# Patient Record
Sex: Female | Born: 1965
Health system: Southern US, Community
[De-identification: ages and names within clinical notes are randomized; demographics above are authoritative.]

## PROBLEM LIST (undated history)

## (undated) DIAGNOSIS — R9082 White matter disease, unspecified: Secondary | ICD-10-CM

## (undated) DIAGNOSIS — Z720 Tobacco use: Secondary | ICD-10-CM

## (undated) DIAGNOSIS — I998 Other disorder of circulatory system: Secondary | ICD-10-CM

## (undated) DIAGNOSIS — R42 Dizziness and giddiness: Secondary | ICD-10-CM

## (undated) DIAGNOSIS — I10 Essential (primary) hypertension: Secondary | ICD-10-CM

## (undated) DIAGNOSIS — G629 Polyneuropathy, unspecified: Secondary | ICD-10-CM

## (undated) DIAGNOSIS — C3490 Malignant neoplasm of unspecified part of unspecified bronchus or lung: Secondary | ICD-10-CM

## (undated) DIAGNOSIS — D649 Anemia, unspecified: Secondary | ICD-10-CM

## (undated) DIAGNOSIS — K219 Gastro-esophageal reflux disease without esophagitis: Secondary | ICD-10-CM

## (undated) HISTORY — DX: White matter disease, unspecified: R90.82

## (undated) HISTORY — PX: OTHER SURGICAL HISTORY: SHX169

## (undated) HISTORY — DX: Malignant neoplasm of unspecified part of unspecified bronchus or lung: C34.90

## (undated) HISTORY — DX: Tobacco use: Z72.0

## (undated) HISTORY — DX: Gastro-esophageal reflux disease without esophagitis: K21.9

## (undated) HISTORY — DX: Polyneuropathy, unspecified: G62.9

## (undated) HISTORY — DX: Other disorder of circulatory system: I99.8

---

## 2013-12-16 ENCOUNTER — Emergency Department: Payer: Self-pay | Admitting: Emergency Medicine

## 2013-12-16 LAB — CBC WITH DIFFERENTIAL/PLATELET
Comment - H1-Com3: NORMAL
HCT: 26 % — ABNORMAL LOW (ref 35.0–47.0)
HGB: 7.6 g/dL — AB (ref 12.0–16.0)
Lymphocytes: 36 %
MCH: 18.9 pg — AB (ref 26.0–34.0)
MCHC: 29.3 g/dL — AB (ref 32.0–36.0)
MCV: 65 fL — ABNORMAL LOW (ref 80–100)
Monocytes: 5 %
Platelet: 458 10*3/uL — ABNORMAL HIGH (ref 150–440)
RBC: 4.02 10*6/uL (ref 3.80–5.20)
RDW: 25.1 % — ABNORMAL HIGH (ref 11.5–14.5)
Segmented Neutrophils: 59 %
WBC: 7.8 10*3/uL (ref 3.6–11.0)

## 2013-12-16 LAB — COMPREHENSIVE METABOLIC PANEL
ALK PHOS: 77 U/L
Albumin: 3.5 g/dL (ref 3.4–5.0)
Anion Gap: 5 — ABNORMAL LOW (ref 7–16)
BUN: 4 mg/dL — AB (ref 7–18)
Bilirubin,Total: 0.2 mg/dL (ref 0.2–1.0)
Calcium, Total: 8.7 mg/dL (ref 8.5–10.1)
Chloride: 97 mmol/L — ABNORMAL LOW (ref 98–107)
Co2: 29 mmol/L (ref 21–32)
Creatinine: 0.4 mg/dL — ABNORMAL LOW (ref 0.60–1.30)
EGFR (African American): >60
Glucose: 94 mg/dL (ref 65–99)
OSMOLALITY: 259 (ref 275–301)
POTASSIUM: 3.6 mmol/L (ref 3.5–5.1)
SGOT(AST): 21 U/L (ref 15–37)
SGPT (ALT): 18 U/L (ref 12–78)
SODIUM: 131 mmol/L — AB (ref 136–145)
TOTAL PROTEIN: 7.6 g/dL (ref 6.4–8.2)

## 2013-12-16 LAB — LIPASE, BLOOD: LIPASE: 320 U/L (ref 73–393)

## 2013-12-16 LAB — URINALYSIS, COMPLETE
BILIRUBIN, UR: NEGATIVE
BLOOD: NEGATIVE
Bacteria: NONE SEEN
Glucose,UR: NEGATIVE mg/dL (ref 0–75)
Ketone: NEGATIVE
Leukocyte Esterase: NEGATIVE
Nitrite: NEGATIVE
Ph: 7 (ref 4.5–8.0)
Protein: NEGATIVE
SPECIFIC GRAVITY: 1.015 (ref 1.003–1.030)
Squamous Epithelial: 7
WBC UR: 1 /HPF (ref 0–5)

## 2013-12-17 LAB — PREGNANCY, URINE: PREGNANCY TEST, URINE: NEGATIVE m[IU]/mL

## 2014-03-28 ENCOUNTER — Emergency Department: Payer: Self-pay | Admitting: Emergency Medicine

## 2014-03-28 LAB — BASIC METABOLIC PANEL
ANION GAP: 8 (ref 7–16)
BUN: 6 mg/dL — AB (ref 7–18)
CHLORIDE: 99 mmol/L (ref 98–107)
CO2: 25 mmol/L (ref 21–32)
CREATININE: 0.53 mg/dL — AB (ref 0.60–1.30)
Calcium, Total: 8.7 mg/dL (ref 8.5–10.1)
EGFR (African American): >60
EGFR (Non-African Amer.): >60
GLUCOSE: 95 mg/dL (ref 65–99)
OSMOLALITY: 262 (ref 275–301)
Potassium: 3.9 mmol/L (ref 3.5–5.1)
Sodium: 132 mmol/L — ABNORMAL LOW (ref 136–145)

## 2014-03-28 LAB — CBC
HCT: 25.7 % — AB (ref 35.0–47.0)
HGB: 7.8 g/dL — ABNORMAL LOW (ref 12.0–16.0)
MCH: 20.2 pg — AB (ref 26.0–34.0)
MCHC: 30.4 g/dL — ABNORMAL LOW (ref 32.0–36.0)
MCV: 66 fL — AB (ref 80–100)
Platelet: 748 10*3/uL — ABNORMAL HIGH (ref 150–440)
RBC: 3.87 10*6/uL (ref 3.80–5.20)
RDW: 29.2 % — AB (ref 11.5–14.5)
WBC: 10.4 10*3/uL (ref 3.6–11.0)

## 2014-03-28 LAB — ETHANOL: Ethanol %: <0.003 % (ref 0.000–0.080)

## 2014-03-28 LAB — MAGNESIUM: Magnesium: 2.1 mg/dL

## 2014-05-12 DIAGNOSIS — G589 Mononeuropathy, unspecified: Secondary | ICD-10-CM | POA: Insufficient documentation

## 2014-05-12 DIAGNOSIS — M21371 Foot drop, right foot: Secondary | ICD-10-CM | POA: Insufficient documentation

## 2014-05-12 DIAGNOSIS — R531 Weakness: Secondary | ICD-10-CM | POA: Insufficient documentation

## 2014-05-12 DIAGNOSIS — R200 Anesthesia of skin: Secondary | ICD-10-CM | POA: Insufficient documentation

## 2014-05-19 ENCOUNTER — Ambulatory Visit (INDEPENDENT_AMBULATORY_CARE_PROVIDER_SITE_OTHER): Payer: BC Managed Care – PPO

## 2014-05-19 ENCOUNTER — Ambulatory Visit (INDEPENDENT_AMBULATORY_CARE_PROVIDER_SITE_OTHER): Payer: BC Managed Care – PPO | Admitting: Podiatry

## 2014-05-19 ENCOUNTER — Encounter: Payer: Self-pay | Admitting: Podiatry

## 2014-05-19 VITALS — BP 127/74 | HR 75 | Resp 16 | Ht 65.0 in | Wt 138.0 lb

## 2014-05-19 DIAGNOSIS — M216X9 Other acquired deformities of unspecified foot: Secondary | ICD-10-CM

## 2014-05-19 DIAGNOSIS — G629 Polyneuropathy, unspecified: Secondary | ICD-10-CM

## 2014-05-19 DIAGNOSIS — G589 Mononeuropathy, unspecified: Secondary | ICD-10-CM

## 2014-05-19 DIAGNOSIS — M21371 Foot drop, right foot: Secondary | ICD-10-CM

## 2014-05-19 NOTE — Progress Notes (Signed)
   Subjective:    Patient ID: Jenna Newton, female    DOB: 19-Sep-1965, 48 y.o.   MRN: [id number]  HPI Comments: Ms. Tech, 48 year old female, presents the office today for numbness in her right foot and to be stated for orthotic. She states that she previously had seen a podiatrist at [facility] clinic and further evaluated by a neurologist, Dr. Gurney Maxin, MD. She previously was placed on steroids and given a surgical boot, both of which did not alleviate her symptoms. She states that she's had numbness in her right foot and leg over the last 2 months and has remained the same. She denies any injury. States that she has difficulty moving her ankle and difficulty with ambulation. She is unable to move her ankle up. She does not have a primary care physician. No other complaints at this time.   Foot Pain      Review of Systems  Cardiovascular:       Calf pain  Musculoskeletal:       Back pain  All other systems reviewed and are negative.      Objective:   Physical Exam AAO x3, NAD DP/PT pulses palpable 2/4 b/l. CRT < 3sec Protective sensation decreased the dorsal of the right foot and along the anterior leg. Vibratory sensation decreased in the right. Decreased ankle dorsiflexion in both active and passive range of motion. Anterior muscle group 3/5, plantarflexion 5/5. During gait, unable to dorsiflex ankle.  No pinpoint bony tenderness.  No open lesions No leg pain/swelling/edema.       Assessment & Plan:  48 year old female with a right drop foot -Dr. Lannie Fields notes reviewed.  - Discussed various treatment options the patient including alternatives, risks, complications. -Recommended an ankle foot orthotic to help accommodate her dropfoot. She was casted for this today. Will be sent to [facility]. -She will be undergoing nerve conduction testing next week. I discussed with her to have the results faxed to [place] or to bring them at her next appointment.  -Follow up after the  AFO arrives. In the meantime, call with any questions/concerns/change in symptoms.

## 2014-06-17 ENCOUNTER — Telehealth: Payer: Self-pay | Admitting: *Deleted

## 2014-06-17 NOTE — Telephone Encounter (Signed)
Left message stating that she had a question concerning a brace her doctor put her in

## 2014-06-21 ENCOUNTER — Telehealth: Payer: Self-pay | Admitting: Podiatry

## 2014-06-21 NOTE — Telephone Encounter (Signed)
[place] brace is in, left message for [place] to call and schedule an appointment with Dr. Jacqualyn Posey

## 2014-06-23 ENCOUNTER — Ambulatory Visit (INDEPENDENT_AMBULATORY_CARE_PROVIDER_SITE_OTHER): Payer: BC Managed Care – PPO | Admitting: *Deleted

## 2014-06-23 ENCOUNTER — Encounter: Payer: Self-pay | Admitting: *Deleted

## 2014-06-23 VITALS — BP 146/80 | HR 87 | Resp 16

## 2014-06-23 DIAGNOSIS — M21371 Foot drop, right foot: Secondary | ICD-10-CM

## 2014-06-23 NOTE — Progress Notes (Signed)
Pt presents to pick up [place].

## 2014-07-26 ENCOUNTER — Ambulatory Visit (INDEPENDENT_AMBULATORY_CARE_PROVIDER_SITE_OTHER): Payer: BC Managed Care – PPO | Admitting: Podiatry

## 2014-07-26 DIAGNOSIS — M21371 Foot drop, right foot: Secondary | ICD-10-CM

## 2014-07-26 NOTE — Progress Notes (Signed)
Patient ID: Jenna Newton, female   DOB: 07-13-1966, 48 y.o.   MRN: [id number]  Subjective: 48 year old female returns the office today for follow-up evaluation after dispensing AFO for right foot secondary to dropfoot. She states that since wearing the brace she's had some discomfort of the left leg as she uses that leg more. She also states that her right leg has become significant smaller than her left leg. She has pain is a been seen by the neurologist and she is starting physical therapy on December 1. She states that since she was told she is not improving she has an appointment to see who she believes is a Psychologist, sport and exercise for evaluation in January. No acute changes since last appointment. No other complaints at this time. Denies any systemic complaints such as fevers, chills, nausea, vomiting.  Objective: AAO x3, NAD DP/PT pulses palpable bilaterally, CRT less than 3 seconds Protective sensation intact with Simms Weinstein monofilament, vibratory sensation intact, Achilles tendon reflex intact The brace appears to be fitting well without any high-pressure areas. The right lower extremity is atrophied compared to the left lower extremity. There is weakness in plantarflexion and dorsiflexion of the ankle. She is starting to develop a mild adductus to her foot while nonweightbearing. There is no pinpoint bony tenderness or pain with vibratory sensation. No overlying edema, erythema, increase in warmth.  Assessment: 48 year old female with right foot drop, etiology unclear at this time for likely due to compression.  Plan: -Treatment options discussed including alternatives, risks, complications. -Patient previously has been seen by neurology and recommended continue follow-up with them. She'll be starting physical therapy December 1st. I discussed with her that physical therapy will help with the strengthening exercises to the right lower extremity and help regain some use of the right leg and has become  weaker over the last several months. She is putting most of her weight on her left foot. Patient also states that she has an appointment for his she believes of the surgeon in January for further evaluation of possible surgical intervention.  -Discussed that she can overtime developed deformity to her foot due to the nerve issue. Discussed with her that the AFO and help support her foot and slow the progression. -Recommended patient follow-up in approximate 4 weeks after starting physical therapy and continues to use the AFO. In the meantime, call the office with any questions, concerns, change in symptoms.

## 2014-07-26 NOTE — Patient Instructions (Signed)
Follow up with physical therapy and your neurologist.  Call with any questions/concerns/change in symptoms.

## 2014-08-02 ENCOUNTER — Encounter: Payer: Self-pay | Admitting: Neurology

## 2014-08-23 ENCOUNTER — Ambulatory Visit (INDEPENDENT_AMBULATORY_CARE_PROVIDER_SITE_OTHER): Payer: BC Managed Care – PPO | Admitting: Podiatry

## 2014-08-23 VITALS — BP 121/71 | HR 91 | Resp 16

## 2014-08-23 DIAGNOSIS — M21371 Foot drop, right foot: Secondary | ICD-10-CM

## 2014-08-24 NOTE — Progress Notes (Signed)
Patient ID: Jenna Newton, female   DOB: 02/27/1966, 48 y.o.   MRN: [id number]  Subjective: 48 year old female returns the office today for follow-up evaluation of AFO and dropfoot on the right foot. The patient states that since last appointment she has gone to physical therapy and she has been discharged. She states that the strength that her right leg is improved compared to what it was prior. She states that she has not been wearing the AFO at work because she feels unbalanced while wearing it. She did not wear it at today's appointment. No other complaints at this time in no acute changes since last appointment. Denies any systemic complaints as fevers, chills, nausea, vomiting.  Objective: AAO 3, NAD DP/PT pulses palpable bilaterally, CRT less than 3 seconds  Protective sensation intact with Simms Weinstein monofilament, vibratory sensation intact, Achilles tendon reflex intact On the right foot there does appear to be in improved dorsiflexion and plantarflexion at the ankle compared to initial evaluation and her strength is improved as well. There is less significant atrophy to the right lower extremity compared to prior from disuse. No open lesions or pre-ulcerative lesions. No pain with calf compression, swelling, warmth, erythema.  Assessment: 48 year old female follow-up of right foot drop foot/AFO  Plan: -Treatment options discussed with the patient including alternatives, risks, complications. -Although the patient has been discharged from physical therapy she states that she feels unbalanced with the brace and is unable to wear. At this time recommended the patient to be further evaluated by physical therapy with the brace in order to regain balance and gait training. Although, her symptoms have improved of the dropfoot she has not fully regained the strength and range of motion. She states that she has an appointment with neurosurgery in January for further evaluation. Recommended to  continue to keep his appointment follow-up with neurology as well. Follow-up after physical therapy evaluation or sooner if any problems are to arise. In the meantime, call the office with any questions, concerns, change in symptoms.

## 2014-09-08 ENCOUNTER — Encounter: Payer: Self-pay | Admitting: *Deleted

## 2014-09-08 ENCOUNTER — Other Ambulatory Visit: Payer: Self-pay | Admitting: *Deleted

## 2014-09-09 ENCOUNTER — Ambulatory Visit: Payer: Self-pay | Admitting: Neurology

## 2014-09-12 ENCOUNTER — Telehealth: Payer: Self-pay | Admitting: Neurology

## 2014-09-12 NOTE — Telephone Encounter (Signed)
Pt no showed 09/09/14 NP appt w/ Dr. Posey Pronto.   [name] - please mail pt no show letter + our no show policy. Please make Dr. Lannie Fields office aware as well / Sherri S.

## 2014-09-15 ENCOUNTER — Encounter: Payer: Self-pay | Admitting: *Deleted

## 2014-09-15 NOTE — Progress Notes (Signed)
No show letters sent for 09/09/2014 Dr. Weber Cooks office notified Jenna Newton) no show notification sent as well

## 2014-11-07 ENCOUNTER — Ambulatory Visit: Payer: Self-pay | Admitting: Family Medicine

## 2014-11-11 ENCOUNTER — Ambulatory Visit: Payer: Self-pay | Admitting: Neurology

## 2014-11-14 ENCOUNTER — Telehealth: Payer: Self-pay | Admitting: Neurology

## 2014-11-14 NOTE — Telephone Encounter (Signed)
Pt has no showed 2nd NP appt w/ Dr. Posey Pronto. Per Dr. Posey Pronto, we will be unable to r/s a future appt with our office.   [name] - please contact Dr. Lannie Fields office at [phone] to make them aware of this 2nd no show / Sherri S.

## 2014-11-15 NOTE — Telephone Encounter (Signed)
Dr. Weber Cooks office Nevin Bloodgood) notified

## 2015-01-23 ENCOUNTER — Emergency Department
Admission: EM | Admit: 2015-01-23 | Discharge: 2015-01-23 | Discharge status: Against Medical Advice | Payer: BLUE CROSS/BLUE SHIELD | Attending: Emergency Medicine | Admitting: Emergency Medicine

## 2015-01-23 ENCOUNTER — Encounter: Payer: Self-pay | Admitting: Emergency Medicine

## 2015-01-23 DIAGNOSIS — N939 Abnormal uterine and vaginal bleeding, unspecified: Secondary | ICD-10-CM | POA: Diagnosis present

## 2015-01-23 DIAGNOSIS — Z720 Tobacco use: Secondary | ICD-10-CM | POA: Diagnosis not present

## 2015-01-23 LAB — CBC
HCT: 39.1 % (ref 35.0–47.0)
Hemoglobin: 12.3 g/dL (ref 12.0–16.0)
MCH: 28.3 pg (ref 26.0–34.0)
MCHC: 31.6 g/dL — AB (ref 32.0–36.0)
MCV: 89.6 fL (ref 80.0–100.0)
Platelets: 338 10*3/uL (ref 150–440)
RBC: 4.36 MIL/uL (ref 3.80–5.20)
RDW: 18.5 % — ABNORMAL HIGH (ref 11.5–14.5)
WBC: 4.9 10*3/uL (ref 3.6–11.0)

## 2015-01-23 NOTE — ED Notes (Signed)
Called for patient x1

## 2015-01-23 NOTE — ED Notes (Signed)
Called for patient again. No response at this time.

## 2015-01-23 NOTE — ED Notes (Signed)
Says bled for 40 days feb/march.  Saw pcp,  And was told to go to specialist, but has not gone because sheis spotting all the time.  Now says heavy bleeding started again

## 2015-01-23 NOTE — ED Notes (Signed)
Called for patient in lobby. No answer

## 2015-02-14 ENCOUNTER — Encounter: Payer: Self-pay | Admitting: Obstetrics and Gynecology

## 2015-08-26 ENCOUNTER — Emergency Department: Payer: BLUE CROSS/BLUE SHIELD

## 2015-08-26 ENCOUNTER — Emergency Department
Admission: EM | Admit: 2015-08-26 | Discharge: 2015-08-26 | Disposition: A | Discharge status: Home / Self Care | Payer: BLUE CROSS/BLUE SHIELD | Attending: Emergency Medicine | Admitting: Emergency Medicine

## 2015-08-26 DIAGNOSIS — S0990XA Unspecified injury of head, initial encounter: Secondary | ICD-10-CM | POA: Diagnosis not present

## 2015-08-26 DIAGNOSIS — Y998 Other external cause status: Secondary | ICD-10-CM | POA: Insufficient documentation

## 2015-08-26 DIAGNOSIS — Y9289 Other specified places as the place of occurrence of the external cause: Secondary | ICD-10-CM | POA: Diagnosis not present

## 2015-08-26 DIAGNOSIS — W01198A Fall on same level from slipping, tripping and stumbling with subsequent striking against other object, initial encounter: Secondary | ICD-10-CM | POA: Insufficient documentation

## 2015-08-26 DIAGNOSIS — F17200 Nicotine dependence, unspecified, uncomplicated: Secondary | ICD-10-CM | POA: Diagnosis not present

## 2015-08-26 DIAGNOSIS — F10120 Alcohol abuse with intoxication, uncomplicated: Secondary | ICD-10-CM | POA: Insufficient documentation

## 2015-08-26 DIAGNOSIS — Z79899 Other long term (current) drug therapy: Secondary | ICD-10-CM | POA: Insufficient documentation

## 2015-08-26 DIAGNOSIS — S0101XA Laceration without foreign body of scalp, initial encounter: Secondary | ICD-10-CM

## 2015-08-26 DIAGNOSIS — S0003XA Contusion of scalp, initial encounter: Secondary | ICD-10-CM

## 2015-08-26 DIAGNOSIS — Y9389 Activity, other specified: Secondary | ICD-10-CM | POA: Insufficient documentation

## 2015-08-26 HISTORY — DX: Dizziness and giddiness: R42

## 2015-08-26 HISTORY — DX: Anemia, unspecified: D64.9

## 2015-08-26 LAB — BASIC METABOLIC PANEL
Anion gap: 9 (ref 5–15)
BUN: 6 mg/dL (ref 6–20)
CHLORIDE: 96 mmol/L — AB (ref 101–111)
CO2: 25 mmol/L (ref 22–32)
Calcium: 8.8 mg/dL — ABNORMAL LOW (ref 8.9–10.3)
Creatinine, Ser: 0.32 mg/dL — ABNORMAL LOW (ref 0.44–1.00)
GFR calc Af Amer: >60 mL/min (ref >60)
GFR calc non Af Amer: >60 mL/min (ref >60)
GLUCOSE: 90 mg/dL (ref 65–99)
POTASSIUM: 4 mmol/L (ref 3.5–5.1)
Sodium: 130 mmol/L — ABNORMAL LOW (ref 135–145)

## 2015-08-26 LAB — CBC WITH DIFFERENTIAL/PLATELET
Basophils Absolute: 0.1 10*3/uL (ref 0–0.1)
Basophils Relative: 1 %
EOS PCT: 0 %
Eosinophils Absolute: 0 10*3/uL (ref 0–0.7)
HEMATOCRIT: 44.3 % (ref 35.0–47.0)
Hemoglobin: 14.9 g/dL (ref 12.0–16.0)
LYMPHS ABS: 3.8 10*3/uL — AB (ref 1.0–3.6)
LYMPHS PCT: 44 %
MCH: 32.5 pg (ref 26.0–34.0)
MCHC: 33.6 g/dL (ref 32.0–36.0)
MCV: 96.7 fL (ref 80.0–100.0)
MONO ABS: 0.3 10*3/uL (ref 0.2–0.9)
MONOS PCT: 3 %
Neutro Abs: 4.4 10*3/uL (ref 1.4–6.5)
Neutrophils Relative %: 52 %
PLATELETS: 292 10*3/uL (ref 150–440)
RBC: 4.58 MIL/uL (ref 3.80–5.20)
RDW: 14.3 % (ref 11.5–14.5)
WBC: 8.6 10*3/uL (ref 3.6–11.0)

## 2015-08-26 LAB — ETHANOL: Alcohol, Ethyl (B): 424 mg/dL (ref <5)

## 2015-08-26 NOTE — ED Provider Notes (Signed)
[facility] Emergency Department Provider Note   ____________________________________________  Time seen: I have reviewed the triage vital signs and the triage nursing note.  HISTORY  Chief Complaint Head Laceration   Historian Limited historian -Patient intoxicated Here with brother, family  HPI Lakeesha Fontanilla is a 49 y.o. female with history of alcohol abuse, who is clinically intoxicated and was off balance and fell striking the back of her head, came in with a bleeding scalp laceration. Denies headache, neck pain, back pain.  No chest pain or trouble breathing. No exacerbating or alleviating factors.    Past Medical History  Diagnosis Date  . GERD (gastroesophageal reflux disease)   . Vertigo   . Anemia     There are no active problems to display for this patient.   Past Surgical History  Procedure Laterality Date  . None      Current Outpatient Rx  Name  Route  Sig  Dispense  Refill  . ferrous sulfate 325 (65 FE) MG tablet   Oral   Take by mouth.         . meclizine (ANTIVERT) 25 MG tablet   Oral   Take 25 mg by mouth 4 (four) times daily as needed for dizziness.         . methylPREDNIsolone (MEDROL DOSPACK) 4 MG tablet                 Allergies Review of patient's allergies indicates no known allergies.  Family History  Problem Relation Age of Onset  . Diabetes Mother   . Hypertension Mother   . Cancer Father   . Diabetes Maternal Grandmother   . Hypertension Maternal Grandmother   . Cancer Paternal Grandmother   . Cancer Paternal Grandfather     Social History Social History  Substance Use Topics  . Smoking status: Current Every Day Smoker  . Smokeless tobacco: None  . Alcohol Use: 0.0 oz/week    0 Standard drinks or equivalent per week    Review of Systems  Constitutional: Negative for fever. Eyes: Negative for visual changes. ENT: Negative for sore throat. Cardiovascular: Negative for chest  pain. Respiratory: Negative for shortness of breath. Gastrointestinal: Negative for abdominal pain, vomiting and diarrhea. Genitourinary: Negative for dysuria. Musculoskeletal: Negative for back pain. Skin: Negative for rash. Neurological: Negative for headache. 10 point Review of Systems otherwise negative ____________________________________________   PHYSICAL EXAM:  VITAL SIGNS: ED Triage Vitals  Enc Vitals Group     BP 08/26/15 1514 181/124 mmHg     Pulse Rate 08/26/15 1514 102     Resp 08/26/15 1514 18     Temp 08/26/15 1514 98.8 F (37.1 C)     Temp Source 08/26/15 1514 Oral     SpO2 08/26/15 1514 98 %     Weight 08/26/15 1514 130 lb (58.968 kg)     Height 08/26/15 1514 '5\' 5"'$  (1.651 m)     Head Cir --      Peak Flow --      Pain Score 08/26/15 1515 0     Pain Loc --      Pain Edu? --      Excl. in [place]? --      Constitutional: Alert and clinically intoxicated.  Eyes: Conjunctivae are normal. PERRL. Normal extraocular movements. ENT   Head: Large posterior scalp hematoma with 5 cm laceration with active bleeding.   Nose: No congestion/rhinnorhea.   Mouth/Throat: Mucous membranes are moist.   Neck: No stridor.  No step-offs. Cardiovascular/Chest: Normal rate, regular rhythm.  No murmurs, rubs, or gallops. Respiratory: Normal respiratory effort without tachypnea nor retractions. Breath sounds are clear and equal bilaterally. No wheezes/rales/rhonchi. Gastrointestinal: Soft. No distention, no guarding, no rebound. Nontender   Genitourinary/rectal:Deferred Musculoskeletal: Nontender with normal range of motion in all extremities. No joint effusions.  No lower extremity tenderness.  No edema. Neurologic:  Boisterous, but alert and redirectable/cooperative.  No focal neurologic deficits. Skin:  Skin is warm, dry.  Scalp lac as above Psychiatric: intoxicated but cooperative.  ____________________________________________   EKG I, Lisa Roca, MD, the  attending physician have personally viewed and interpreted all ECGs.  None ____________________________________________  LABS (pertinent positives/negatives)  Basic metabolic panel significant for sodium 1:30, chloride 96 CBC within normal limits Ethanol  424  ____________________________________________  RADIOLOGY All Xrays were viewed by me. Imaging interpreted by Radiologist.  CT head noncontrast and CT cervical spine non-contrast:   IMPRESSION: 1. No acute intracranial abnormality. 2. No evidence for cervical spine fracture. 3. Cervical degenerative disc disease noted. __________________________________________  PROCEDURES  Procedure(s) performed: LACERATION REPAIR Performed by: Lisa Roca Authorized by: Lisa Roca Consent: Verbal consent obtained. Risks and benefits: risks, benefits and alternatives were discussed Consent given by: patient Patient identity confirmed: provided demographic data Prepped and Draped in normal sterile fashion Wound explored  Laceration Location: Posterior scalp  Laceration Length: 5 cm  No Foreign Bodies seen or palpated  Irrigation method: syringe Amount of cleaning: standard  Skin closure: 4-0 Prolene   Number of sutures: 5   Technique: Interrupted   Patient tolerance: Patient tolerated the procedure well with no immediate complications.   Critical Care performed: None  ____________________________________________   ED COURSE / ASSESSMENT AND PLAN  CONSULTATIONS: None  Pertinent labs & imaging results that were available during my care of the patient were reviewed by me and considered in my medical decision making (see chart for details).  Patient with bleeding scalp laceration closed with sutures with hemostasis obtained.   No other apparent physical injuries from the fall, but due to intoxication, I did obtain CT of the head and C-spine. These were negative for acute traumatic injury.  Patient's fianc  is here with patient, and comfortable taking her home.    Patient / Family / Caregiver informed of clinical course, medical decision-making process, and agree with plan.   I discussed return precautions, follow-up instructions, and discharged instructions with patient and/or family.  ___________________________________________   FINAL CLINICAL IMPRESSION(S) / ED DIAGNOSES   Final diagnoses:  Scalp laceration, initial encounter  Scalp hematoma, initial encounter   alcohol intoxication     Lisa Roca, MD 08/26/15 [phone]

## 2015-08-26 NOTE — Discharge Instructions (Signed)
You were treated for head injury and your scalp laceration was repaired with 5 stitches.  These need to be removed in about 14 days -- make an appointment with your primary doctor for stitches removal.  Return to the emergency department for any worsening condition including any new pain, weakness, numbness, confusion or altered mental status.   Facial or Scalp Contusion A facial or scalp contusion is a deep bruise on the face or head. Injuries to the face and head generally cause a lot of swelling, especially around the eyes. Contusions are the result of an injury that caused bleeding under the skin. The contusion may turn blue, purple, or yellow. Minor injuries will give you a painless contusion, but more severe contusions may stay painful and swollen for a few weeks.  CAUSES  A facial or scalp contusion is caused by a blunt injury or trauma to the face or head area.  SIGNS AND SYMPTOMS   Swelling of the injured area.   Discoloration of the injured area.   Tenderness, soreness, or pain in the injured area.  DIAGNOSIS  The diagnosis can be made by taking a medical history and doing a physical exam. An X-ray exam, CT scan, or MRI may be needed to determine if there are any associated injuries, such as broken bones (fractures). TREATMENT  Often, the best treatment for a facial or scalp contusion is applying cold compresses to the injured area. Over-the-counter medicines may also be recommended for pain control.  HOME CARE INSTRUCTIONS   Only take over-the-counter or prescription medicines as directed by your health care provider.   Apply ice to the injured area.   Put ice in a plastic bag.   Place a towel between your skin and the bag.   Leave the ice on for 20 minutes, 2-3 times a day.  SEEK MEDICAL CARE IF:  You have bite problems.   You have pain with chewing.   You are concerned about facial defects. SEEK IMMEDIATE MEDICAL CARE IF:  You have severe pain or a  headache that is not relieved by medicine.   You have unusual sleepiness, confusion, or personality changes.   You throw up (vomit).   You have a persistent nosebleed.   You have double vision or blurred vision.   You have fluid drainage from your nose or ear.   You have difficulty walking or using your arms or legs.  MAKE SURE YOU:   Understand these instructions.  Will watch your condition.  Will get help right away if you are not doing well or get worse.   This information is not intended to replace advice given to you by your health care provider. Make sure you discuss any questions you have with your health care provider.   Document Released: 09/26/2004 Document Revised: 09/09/2014 Document Reviewed: 04/01/2013 Elsevier Interactive Patient Education 2016 [place], Adult A laceration is a cut that goes through all layers of the skin. The cut also goes into the tissue that is right under the skin. Some cuts heal on their own. Others need to be closed with stitches (sutures), staples, skin adhesive strips, or wound glue. Taking care of your cut lowers your risk of infection and helps your cut to heal better. HOW TO TAKE CARE OF YOUR CUT For stitches or staples:  Keep the wound clean and dry.  If you were given a bandage (dressing), you should change it at least one time per day or as told by  your doctor. You should also change it if it gets wet or dirty.  Keep the wound completely dry for the first 24 hours or as told by your doctor. After that time, you may take a shower or a bath. However, make sure that the wound is not soaked in water until after the stitches or staples have been removed.  Clean the wound one time each day or as told by your doctor:  Wash the wound with soap and water.  Rinse the wound with water until all of the soap comes off.  Pat the wound dry with a clean towel. Do not rub the wound.  After you clean the wound, put  a thin layer of antibiotic ointment on it as told by your doctor. This ointment:  Helps to prevent infection.  Keeps the bandage from sticking to the wound.  Have your stitches or staples removed as told by your doctor. If your doctor used skin adhesive strips:   Keep the wound clean and dry.  If you were given a bandage, you should change it at least one time per day or as told by your doctor. You should also change it if it gets dirty or wet.  Do not get the skin adhesive strips wet. You can take a shower or a bath, but be careful to keep the wound dry.  If the wound gets wet, pat it dry with a clean towel. Do not rub the wound.  Skin adhesive strips fall off on their own. You can trim the strips as the wound heals. Do not remove any strips that are still stuck to the wound. They will fall off after a while. If your doctor used wound glue:  Try to keep your wound dry, but you may briefly wet it in the shower or bath. Do not soak the wound in water, such as by swimming.  After you take a shower or a bath, gently pat the wound dry with a clean towel. Do not rub the wound.  Do not do any activities that will make you really sweaty until the skin glue has fallen off on its own.  Do not apply liquid, cream, or ointment medicine to your wound while the skin glue is still on.  If you were given a bandage, you should change it at least one time per day or as told by your doctor. You should also change it if it gets dirty or wet.  If a bandage is placed over the wound, do not let the tape for the bandage touch the skin glue.  Do not pick at the glue. The skin glue usually stays on for 5-10 days. Then, it falls off of the skin. General Instructions  To help prevent scarring, make sure to cover your wound with sunscreen whenever you are outside after stitches are removed, after adhesive strips are removed, or when wound glue stays in place and the wound is healed. Make sure to wear a  sunscreen of at least 30 SPF.  Take over-the-counter and prescription medicines only as told by your doctor.  If you were given antibiotic medicine or ointment, take or apply it as told by your doctor. Do not stop using the antibiotic even if your wound is getting better.  Do not scratch or pick at the wound.  Keep all follow-up visits as told by your doctor. This is important.  Check your wound every day for signs of infection. Watch for:  Redness, swelling, or pain.  Fluid, blood, or pus.  Raise (elevate) the injured area above the level of your heart while you are sitting or lying down, if possible. GET HELP IF:  You got a tetanus shot and you have any of these problems at the injection site:  Swelling.  Very bad pain.  Redness.  Bleeding.  You have a fever.  A wound that was closed breaks open.  You notice a bad smell coming from your wound or your bandage.  You notice something coming out of the wound, such as wood or glass.  Medicine does not help your pain.  You have more redness, swelling, or pain at the site of your wound.  You have fluid, blood, or pus coming from your wound.  You notice a change in the color of your skin near your wound.  You need to change the bandage often because fluid, blood, or pus is coming from the wound.  You start to have a new rash.  You start to have numbness around the wound. GET HELP RIGHT AWAY IF:  You have very bad swelling around the wound.  Your pain suddenly gets worse and is very bad.  You notice painful lumps near the wound or on skin that is anywhere on your body.  You have a red streak going away from your wound.  The wound is on your hand or foot and you cannot move a finger or toe like you usually can.  The wound is on your hand or foot and you notice that your fingers or toes look pale or bluish.   This information is not intended to replace advice given to you by your health care provider. Make sure  you discuss any questions you have with your health care provider.   Document Released: 02/05/2008 Document Revised: 01/03/2015 Document Reviewed: 08/15/2014 Elsevier Interactive Patient Education [employer].

## 2015-08-26 NOTE — ED Notes (Signed)
MD Lord at bedside, Pt received 5 stitches. Pt tolerated well

## 2015-08-26 NOTE — ED Notes (Addendum)
Pt attempting to climb out of bed, pt staggering and unsteady. Advised of the need to stay in bed and her risk of falling. Sitter at bedside and bed alarm on

## 2015-08-26 NOTE — ED Notes (Signed)
Patient transported to CT 

## 2015-08-26 NOTE — ED Notes (Signed)
Pt arrived via EMS from home, reports a witnessed fall, hit back of head, actively bleeding on assessment. Intoxicated on assessment, admits to ETOH use this morning. Alert and oriented x4

## 2017-03-13 ENCOUNTER — Encounter: Payer: Self-pay | Admitting: Family Medicine

## 2017-03-14 ENCOUNTER — Encounter: Payer: Self-pay | Admitting: Family Medicine

## 2017-03-14 ENCOUNTER — Ambulatory Visit (INDEPENDENT_AMBULATORY_CARE_PROVIDER_SITE_OTHER): Payer: BLUE CROSS/BLUE SHIELD | Admitting: Family Medicine

## 2017-03-14 DIAGNOSIS — G629 Polyneuropathy, unspecified: Secondary | ICD-10-CM | POA: Diagnosis not present

## 2017-03-14 DIAGNOSIS — R030 Elevated blood-pressure reading, without diagnosis of hypertension: Secondary | ICD-10-CM | POA: Diagnosis not present

## 2017-03-14 DIAGNOSIS — R631 Polydipsia: Secondary | ICD-10-CM

## 2017-03-14 DIAGNOSIS — D649 Anemia, unspecified: Secondary | ICD-10-CM

## 2017-03-14 DIAGNOSIS — R682 Dry mouth, unspecified: Secondary | ICD-10-CM

## 2017-03-14 DIAGNOSIS — Z720 Tobacco use: Secondary | ICD-10-CM

## 2017-03-14 DIAGNOSIS — R42 Dizziness and giddiness: Secondary | ICD-10-CM | POA: Diagnosis not present

## 2017-03-14 HISTORY — DX: Tobacco use: Z72.0

## 2017-03-14 LAB — LIPID PANEL
CHOL/HDL RATIO: 5.6 ratio — AB (ref <5.0)
CHOLESTEROL: 272 mg/dL — AB (ref <200)
HDL: 49 mg/dL — ABNORMAL LOW (ref >50)
LDL Cholesterol: 183 mg/dL — ABNORMAL HIGH (ref <100)
Triglycerides: 202 mg/dL — ABNORMAL HIGH (ref <150)
VLDL: 40 mg/dL — ABNORMAL HIGH (ref <30)

## 2017-03-14 LAB — COMPLETE METABOLIC PANEL WITH GFR
ALT: 24 U/L (ref 6–29)
AST: 19 U/L (ref 10–35)
Albumin: 4.3 g/dL (ref 3.6–5.1)
Alkaline Phosphatase: 68 U/L (ref 33–130)
BILIRUBIN TOTAL: 0.3 mg/dL (ref 0.2–1.2)
BUN: 11 mg/dL (ref 7–25)
CALCIUM: 9.6 mg/dL (ref 8.6–10.4)
CO2: 25 mmol/L (ref 20–31)
CREATININE: 0.5 mg/dL (ref 0.50–1.05)
Chloride: 103 mmol/L (ref 98–110)
Glucose, Bld: 97 mg/dL (ref 65–99)
Potassium: 3.9 mmol/L (ref 3.5–5.3)
Sodium: 139 mmol/L (ref 135–146)
TOTAL PROTEIN: 7.2 g/dL (ref 6.1–8.1)

## 2017-03-14 LAB — TSH: TSH: 1.16 m[IU]/L

## 2017-03-14 MED ORDER — FERROUS SULFATE 325 (65 FE) MG PO TABS: 325.0000 mg | ORAL_TABLET | Freq: Every day | ORAL | 0 refills | Status: DC

## 2017-03-14 MED ORDER — MECLIZINE HCL 25 MG PO TABS: 25.0000 mg | ORAL_TABLET | Freq: Four times a day (QID) | ORAL | 2 refills | Status: DC | PRN

## 2017-03-14 MED ORDER — LISINOPRIL 10 MG PO TABS: 10.0000 mg | ORAL_TABLET | Freq: Every day | ORAL | 0 refills | Status: DC

## 2017-03-14 NOTE — Assessment & Plan Note (Signed)
Taking iron; refuses colonoscopy; stool cards given; check CBC, other labs

## 2017-03-14 NOTE — Assessment & Plan Note (Signed)
Check glucose and A1c to r/o diabetes; strong fam hx

## 2017-03-14 NOTE — Assessment & Plan Note (Signed)
Patient is not interested in quitting; I am here to help her when desired

## 2017-03-14 NOTE — Assessment & Plan Note (Signed)
Check labs today to r/o diabetes

## 2017-03-14 NOTE — Assessment & Plan Note (Signed)
Check labs 

## 2017-03-14 NOTE — Assessment & Plan Note (Signed)
Try DASH guidelines 

## 2017-03-14 NOTE — Progress Notes (Signed)
BP (!) 150/82   Pulse 84   Temp 98.1 F (36.7 C) (Oral)   Resp 14   Wt 151 lb 12.8 oz (68.9 kg)   LMP 01/23/2015   SpO2 97%   BMI 25.26 kg/m    Subjective:    Patient ID: Jenna Newton, female    DOB: 1965-11-13, 51 y.o.   MRN: [id number]  HPI: Jenna Newton is a 51 y.o. female  Chief Complaint  Patient presents with  . Labs Only    wants to bet tested for DM symptoms include: dizziness, dry mouth, increased thirst and urination.    HPI She is new to me She wants to be tested for diabetes; dry mouth, increased thirst, increased urination, dizziness Small headaches; very dry skin Brother has diabetes, lots of family members Having dizziness, has had it for years; takes meclizine but this week it's worse Feeling awful all week; has been taking meclizine since 2016; vertigo runs in the family, aunt and cousin Anemia; taking iron; seeing blood in the stool at times; just when constipated; she has not had a colonoscopy and does not want one Blood pressure up today; lots of HTN in the family Neuropathy; she was told it could be diabetes; her right foot went numb for 2-3 weeks, went to the hospital; has to make the right foot move; they thought she had a stroke but she didn't Two head CTs, one in 2016 and one in 2015; no stroke; she does not think she needs another head CT today Smokes; 8 cigs a day; not ready to quit  Depression screen [facility] 2/9 03/14/2017  Decreased Interest 0  Down, Depressed, Hopeless 0  PHQ - 2 Score 0   Relevant past medical, surgical, family and social history reviewed Past Medical History:  Diagnosis Date  . Anemia   . GERD (gastroesophageal reflux disease)   . Neuropathy   . Tobacco abuse 03/14/2017  . Vertigo    Past Surgical History:  Procedure Laterality Date  . none     Family History  Problem Relation Age of Onset  . Diabetes Mother   . Hypertension Mother   . Cancer Father   . Diabetes Maternal Grandmother   . Hypertension Maternal  Grandmother   . Cancer Paternal Grandmother   . Cancer Paternal Grandfather    Social History   Social History  . Marital status: Single    Spouse name: N/A  . Number of children: N/A  . Years of education: N/A   Occupational History  . Not on file.   Social History Main Topics  . Smoking status: Current Every Day Smoker  . Smokeless tobacco: Never Used  . Alcohol use 14.4 oz/week    24 Cans of beer per week  . Drug use: No  . Sexual activity: Yes   Other Topics Concern  . Not on file   Social History Narrative  . No narrative on file    Interim medical history since last visit reviewed. Allergies and medications reviewed  Review of Systems Per HPI unless specifically indicated above     Objective:    BP (!) 150/82   Pulse 84   Temp 98.1 F (36.7 C) (Oral)   Resp 14   Wt 151 lb 12.8 oz (68.9 kg)   LMP 01/23/2015   SpO2 97%   BMI 25.26 kg/m   Wt Readings from Last 3 Encounters:  03/14/17 151 lb 12.8 oz (68.9 kg)  08/26/15 130 lb (59 kg)  01/23/15  128 lb (58.1 kg)    Physical Exam  Constitutional: She appears well-developed and well-nourished. No distress.  Smell of cigarette smoke quite obvious  HENT:  Head: Normocephalic and atraumatic.  Eyes: EOM are normal. No scleral icterus.  Neck: No thyromegaly present.  Cardiovascular: Normal rate, regular rhythm and normal heart sounds.   No murmur heard. Pulmonary/Chest: Effort normal and breath sounds normal. No respiratory distress. She has no wheezes.  Abdominal: Soft. Bowel sounds are normal. She exhibits no distension.  Musculoskeletal: Normal range of motion. She exhibits no edema.  Neurological: She is alert. She exhibits normal muscle tone.  Skin: Skin is warm and dry. She is not diaphoretic. No pallor.  Psychiatric: She has a normal mood and affect. Her behavior is normal. Judgment and thought content normal.   Results for orders placed or performed during the hospital encounter of 70/62/37    Basic metabolic panel  Result Value Ref Range   Sodium 130 (L) 135 - 145 mmol/L   Potassium 4.0 3.5 - 5.1 mmol/L   Chloride 96 (L) 101 - 111 mmol/L   CO2 25 22 - 32 mmol/L   Glucose, Bld 90 65 - 99 mg/dL   BUN 6 6 - 20 mg/dL   Creatinine, Ser 0.32 (L) 0.44 - 1.00 mg/dL   Calcium 8.8 (L) 8.9 - 10.3 mg/dL   GFR calc non Af Amer >60 >60 mL/min   GFR calc Af Amer >60 >60 mL/min   Anion gap 9 5 - 15  CBC with Differential  Result Value Ref Range   WBC 8.6 3.6 - 11.0 K/uL   RBC 4.58 3.80 - 5.20 MIL/uL   Hemoglobin 14.9 12.0 - 16.0 g/dL   HCT 44.3 35.0 - 47.0 %   MCV 96.7 80.0 - 100.0 fL   MCH 32.5 26.0 - 34.0 pg   MCHC 33.6 32.0 - 36.0 g/dL   RDW 14.3 11.5 - 14.5 %   Platelets 292 150 - 440 K/uL   Neutrophils Relative % 52 %   Neutro Abs 4.4 1.4 - 6.5 K/uL   Lymphocytes Relative 44 %   Lymphs Abs 3.8 (H) 1.0 - 3.6 K/uL   Monocytes Relative 3 %   Monocytes Absolute 0.3 0.2 - 0.9 K/uL   Eosinophils Relative 0 %   Eosinophils Absolute 0.0 0 - 0.7 K/uL   Basophils Relative 1 %   Basophils Absolute 0.1 0 - 0.1 K/uL  Ethanol  Result Value Ref Range   Alcohol, Ethyl (B) 424 (HH) <5 mg/dL      Assessment & Plan:   Problem List Items Addressed This Visit      Digestive   Dry mouth    Check labs today to r/o diabetes      Relevant Orders   Hemoglobin A1c     Nervous and Auditory   Neuropathy    Check labs      Relevant Orders   COMPLETE METABOLIC PANEL WITH GFR   Hemoglobin A1c   TSH     Other   Tobacco abuse    Patient is not interested in quitting; I am here to help her when desired      Increased thirst    Check glucose and A1c to r/o diabetes; strong fam hx      Elevated blood pressure reading    Try DASH guidelines      Relevant Orders   Lipid panel   Dizziness    Discussed ddx; she does not want head CT;  will check labs to r/o anemia, diabetes; known fam hx of vertigo; rx for meclizine given      Anemia    Taking iron; refuses colonoscopy;  stool cards given; check CBC, other labs      Relevant Medications   ferrous sulfate 325 (65 FE) MG tablet   Other Relevant Orders   CBC with Differential/Platelet   COMPLETE METABOLIC PANEL WITH GFR   B12   Ferritin       Follow up plan: Return in about 2 weeks (around 03/28/2017).  An after-visit summary was printed and given to the patient at [place] [facility].  Please see the patient instructions which may contain other information and recommendations beyond what is mentioned above in the assessment and plan.  Meds ordered this encounter  Medications  . lisinopril (PRINIVIL,ZESTRIL) 10 MG tablet    Sig: Take 1 tablet (10 mg total) by mouth daily.    Dispense:  30 tablet    Refill:  0  . meclizine (ANTIVERT) 25 MG tablet    Sig: Take 1 tablet (25 mg total) by mouth 4 (four) times daily as needed for dizziness.    Dispense:  30 tablet    Refill:  2  . ferrous sulfate 325 (65 FE) MG tablet    Sig: Take 1 tablet (325 mg total) by mouth daily.    Dispense:  30 tablet    Refill:  0    Orders Placed This Encounter  Procedures  . CBC with Differential/Platelet  . COMPLETE METABOLIC PANEL WITH GFR  . Hemoglobin A1c  . Lipid panel  . TSH  . B12  . Ferritin

## 2017-03-14 NOTE — Assessment & Plan Note (Signed)
Discussed ddx; she does not want head CT; will check labs to r/o anemia, diabetes; known fam hx of vertigo; rx for meclizine given

## 2017-03-14 NOTE — Patient Instructions (Addendum)
Your goal blood pressure is less than 140 mmHg on top. Try to follow the DASH guidelines (DASH stands for Dietary Approaches to Stop Hypertension) Try to limit the sodium in your diet.  Ideally, consume less than 1.5 grams (less than 1,500mg ) per day. Do not add salt when cooking or at the table.  Check the sodium amount on labels when shopping, and choose items lower in sodium when given a choice. Avoid or limit foods that already contain a lot of sodium. Eat a diet rich in fruits and vegetables and whole grains. I do encourage you to quit smoking Call [phone] to sign up for smoking cessation classes You can call 1-800-QUIT-NOW to talk with a smoking cessation coach Start the blood pressure medicine Return in 2 weeks for recheck  [place] stands for "Dietary Approaches to Stop Hypertension." The DASH eating plan is a healthy eating plan that has been shown to reduce high blood pressure (hypertension). It may also reduce your risk for type 2 diabetes, heart disease, and stroke. The DASH eating plan may also help with weight loss. What are tips for following this plan? General guidelines  Avoid eating more than 2,300 mg (milligrams) of salt (sodium) a day. If you have hypertension, you may need to reduce your sodium intake to 1,500 mg a day.  Limit alcohol intake to no more than 1 drink a day for nonpregnant women and 2 drinks a day for men. One drink equals 12 oz of beer, 5 oz of wine, or 1 oz of hard liquor.  Work with your health care provider to maintain a healthy body weight or to lose weight. Ask what an ideal weight is for you.  Get at least 30 minutes of exercise that causes your heart to beat faster (aerobic exercise) most days of the week. Activities may include walking, swimming, or biking.  Work with your health care provider or diet and nutrition specialist (dietitian) to adjust your eating plan to your individual calorie needs. Reading food labels  Check food  labels for the amount of sodium per serving. Choose foods with less than 5 percent of the Daily Value of sodium. Generally, foods with less than 300 mg of sodium per serving fit into this eating plan.  To find whole grains, look for the word "whole" as the first word in the ingredient list. Shopping  Buy products labeled as "low-sodium" or "no salt added."  Buy fresh foods. Avoid canned foods and premade or frozen meals. Cooking  Avoid adding salt when cooking. Use salt-free seasonings or herbs instead of table salt or sea salt. Check with your health care provider or pharmacist before using salt substitutes.  Do not fry foods. Cook foods using healthy methods such as baking, boiling, grilling, and broiling instead.  Cook with heart-healthy oils, such as olive, canola, soybean, or sunflower oil. Meal planning   Eat a balanced diet that includes: ? 5 or more servings of fruits and vegetables each day. At each meal, try to fill half of your plate with fruits and vegetables. ? Up to 6-8 servings of whole grains each day. ? Less than 6 oz of lean meat, poultry, or fish each day. A 3-oz serving of meat is about the same size as a deck of cards. One egg equals 1 oz. ? 2 servings of low-fat dairy each day. ? A serving of nuts, seeds, or beans 5 times each week. ? Heart-healthy fats. Healthy fats called Omega-3 fatty acids are found  in foods such as flaxseeds and coldwater fish, like sardines, salmon, and mackerel.  Limit how much you eat of the following: ? Canned or prepackaged foods. ? Food that is high in trans fat, such as fried foods. ? Food that is high in saturated fat, such as fatty meat. ? Sweets, desserts, sugary drinks, and other foods with added sugar. ? Full-fat dairy products.  Do not salt foods before eating.  Try to eat at least 2 vegetarian meals each week.  Eat more home-cooked food and less restaurant, buffet, and fast food.  When eating at a restaurant, ask that  your food be prepared with less salt or no salt, if possible. What foods are recommended? The items listed may not be a complete list. Talk with your dietitian about what dietary choices are best for you. Grains Whole-grain or whole-wheat bread. Whole-grain or whole-wheat pasta. Brown rice. [place]. Bulgur. Whole-grain and low-sodium cereals. Pita bread. Low-fat, low-sodium crackers. Whole-wheat flour tortillas. Vegetables Fresh or frozen vegetables (raw, steamed, roasted, or grilled). Low-sodium or reduced-sodium tomato and vegetable juice. Low-sodium or reduced-sodium tomato sauce and tomato paste. Low-sodium or reduced-sodium canned vegetables. Fruits All fresh, dried, or frozen fruit. Canned fruit in natural juice (without added sugar). Meat and other protein foods Skinless chicken or [place]. Ground chicken or [place]. Pork with fat trimmed off. Fish and seafood. Egg whites. Dried beans, peas, or lentils. Unsalted nuts, nut butters, and seeds. Unsalted canned beans. Lean cuts of beef with fat trimmed off. Low-sodium, lean deli meat. Dairy Low-fat (1%) or fat-free (skim) milk. Fat-free, low-fat, or reduced-fat cheeses. Nonfat, low-sodium ricotta or cottage cheese. Low-fat or nonfat yogurt. Low-fat, low-sodium cheese. Fats and oils Soft margarine without trans fats. Vegetable oil. Low-fat, reduced-fat, or light mayonnaise and salad dressings (reduced-sodium). Canola, safflower, olive, soybean, and sunflower oils. Avocado. Seasoning and other foods Herbs. Spices. Seasoning mixes without salt. Unsalted popcorn and pretzels. Fat-free sweets. What foods are not recommended? The items listed may not be a complete list. Talk with your dietitian about what dietary choices are best for you. Grains Baked goods made with fat, such as croissants, muffins, or some breads. Dry pasta or rice meal packs. Vegetables Creamed or fried vegetables. Vegetables in a cheese sauce. Regular canned vegetables  (not low-sodium or reduced-sodium). Regular canned tomato sauce and paste (not low-sodium or reduced-sodium). Regular tomato and vegetable juice (not low-sodium or reduced-sodium). Angie Fava. Olives. Fruits Canned fruit in a light or heavy syrup. Fried fruit. Fruit in cream or butter sauce. Meat and other protein foods Fatty cuts of meat. Ribs. Fried meat. Berniece Salines. Sausage. Bologna and other processed lunch meats. Salami. Fatback. Hotdogs. Bratwurst. Salted nuts and seeds. Canned beans with added salt. Canned or smoked fish. Whole eggs or egg yolks. Chicken or [place] with skin. Dairy Whole or 2% milk, cream, and half-and-half. Whole or full-fat cream cheese. Whole-fat or sweetened yogurt. Full-fat cheese. Nondairy creamers. Whipped toppings. Processed cheese and cheese spreads. Fats and oils Butter. Stick margarine. Lard. Shortening. Ghee. Bacon fat. Tropical oils, such as coconut, palm kernel, or palm oil. Seasoning and other foods Salted popcorn and pretzels. Onion salt, garlic salt, seasoned salt, table salt, and sea salt. Worcestershire sauce. Tartar sauce. Barbecue sauce. Teriyaki sauce. Soy sauce, including reduced-sodium. Steak sauce. Canned and packaged gravies. Fish sauce. Oyster sauce. Cocktail sauce. Horseradish that you find on the shelf. Ketchup. Mustard. Meat flavorings and tenderizers. Bouillon cubes. Hot sauce and Tabasco sauce. Premade or packaged marinades. Premade or packaged taco seasonings. Relishes.  Regular salad dressings. Where to find more information:  National Heart, Lung, and [place]: https://wilson-eaton.com/  American Heart Association: www.heart.org Summary  The DASH eating plan is a healthy eating plan that has been shown to reduce high blood pressure (hypertension). It may also reduce your risk for type 2 diabetes, heart disease, and stroke.  With the DASH eating plan, you should limit salt (sodium) intake to 2,300 mg a day. If you have hypertension, you may need to  reduce your sodium intake to 1,500 mg a day.  When on the DASH eating plan, aim to eat more fresh fruits and vegetables, whole grains, lean proteins, low-fat dairy, and heart-healthy fats.  Work with your health care provider or diet and nutrition specialist (dietitian) to adjust your eating plan to your individual calorie needs. This information is not intended to replace advice given to you by your health care provider. Make sure you discuss any questions you have with your health care provider. Document Released: 08/08/2011 Document Revised: 08/12/2016 Document Reviewed: 08/12/2016 Elsevier Interactive Patient Education  2017 [employer].

## 2017-03-15 ENCOUNTER — Other Ambulatory Visit: Payer: Self-pay | Admitting: Family Medicine

## 2017-03-15 LAB — CBC WITH DIFFERENTIAL/PLATELET
BASOS ABS: 0 {cells}/uL (ref 0–200)
Basophils Relative: 0 %
EOS ABS: 180 {cells}/uL (ref 15–500)
Eosinophils Relative: 3 %
HEMATOCRIT: 43.9 % (ref 35.0–45.0)
Hemoglobin: 14.8 g/dL (ref 11.7–15.5)
LYMPHS PCT: 40 %
Lymphs Abs: 2400 cells/uL (ref 850–3900)
MCH: 33.4 pg — AB (ref 27.0–33.0)
MCHC: 33.7 g/dL (ref 32.0–36.0)
MCV: 99.1 fL (ref 80.0–100.0)
MONO ABS: 480 {cells}/uL (ref 200–950)
MONOS PCT: 8 %
MPV: 10.8 fL (ref 7.5–12.5)
Neutro Abs: 2940 cells/uL (ref 1500–7800)
Neutrophils Relative %: 49 %
Platelets: 196 10*3/uL (ref 140–400)
RBC: 4.43 MIL/uL (ref 3.80–5.10)
RDW: 13.6 % (ref 11.0–15.0)
WBC: 6 10*3/uL (ref 3.8–10.8)

## 2017-03-15 LAB — VITAMIN B12: VITAMIN B 12: 363 pg/mL (ref 200–1100)

## 2017-03-15 LAB — FERRITIN: FERRITIN: 126 ng/mL (ref 10–232)

## 2017-03-15 LAB — HEMOGLOBIN A1C
HEMOGLOBIN A1C: 5.3 % (ref <5.7)
Mean Plasma Glucose: 105 mg/dL

## 2017-03-15 MED ORDER — ATORVASTATIN CALCIUM 20 MG PO TABS: 20.0000 mg | ORAL_TABLET | Freq: Every day | ORAL | 1 refills | Status: DC

## 2017-03-15 NOTE — Progress Notes (Signed)
Start statin, recheck lipids and sgpt in 6 weeks, asked staff to order labs

## 2017-03-24 ENCOUNTER — Telehealth: Payer: Self-pay

## 2017-03-24 ENCOUNTER — Other Ambulatory Visit: Payer: Self-pay

## 2017-03-24 ENCOUNTER — Telehealth: Payer: Self-pay | Admitting: Family Medicine

## 2017-03-24 DIAGNOSIS — E785 Hyperlipidemia, unspecified: Secondary | ICD-10-CM

## 2017-03-24 DIAGNOSIS — Z5181 Encounter for therapeutic drug level monitoring: Secondary | ICD-10-CM

## 2017-03-24 NOTE — Telephone Encounter (Signed)
Stop meclizine then and remove from med list (side effects) I already sent the cholesterol medicine to her pharmacy so she can pick that up at [facility] Please order labs (see note in lipid panel results) Thank you

## 2017-03-24 NOTE — Telephone Encounter (Signed)
Please see the note from lab results: "recheck fasting lipids and sgpt in 6 weeks (please order, dx hyperlipidemia, medication monitoring); thank you"  Please enter the SGPT too Thank you

## 2017-03-24 NOTE — Telephone Encounter (Signed)
Left detail messaged, order labs and took meclizine off pt med list and add it to allergy reactions.

## 2017-03-24 NOTE — Telephone Encounter (Signed)
Pt states she start having slight headaches whenever she takes meclizine and lisinopril. Headaches come and go. Please advise.  Also went over lab results. She agree to be on a cholesterol medicine. Pleease send RX to [facility]

## 2017-03-25 ENCOUNTER — Other Ambulatory Visit: Payer: Self-pay

## 2017-03-25 DIAGNOSIS — Z5181 Encounter for therapeutic drug level monitoring: Secondary | ICD-10-CM

## 2017-03-31 NOTE — Telephone Encounter (Signed)
Note still open on my desktop Please document and sign if addressed Thank you

## 2017-04-01 NOTE — Telephone Encounter (Signed)
It's been addressed and labs are in.

## 2017-04-14 ENCOUNTER — Other Ambulatory Visit: Payer: Self-pay

## 2017-04-14 NOTE — Telephone Encounter (Signed)
Has an appt friday

## 2017-04-15 NOTE — Telephone Encounter (Signed)
Patient was supposed to have been seen two weeks after starting the ACE-I; it's now been one month since her last visit and when the BP medicine got started I need to monitor her creatinine and potassium on this medicine Ask her to come in TODAY for a BMP, please order for medication monitoring

## 2017-04-15 NOTE — Telephone Encounter (Signed)
Left patient detailed voicemail

## 2017-04-16 ENCOUNTER — Other Ambulatory Visit: Payer: Self-pay

## 2017-04-16 ENCOUNTER — Other Ambulatory Visit: Payer: Self-pay | Admitting: Family Medicine

## 2017-04-16 DIAGNOSIS — E785 Hyperlipidemia, unspecified: Secondary | ICD-10-CM | POA: Diagnosis not present

## 2017-04-16 DIAGNOSIS — Z5181 Encounter for therapeutic drug level monitoring: Secondary | ICD-10-CM

## 2017-04-17 LAB — LIPID PANEL
Cholesterol: 196 mg/dL (ref <200)
HDL: 46 mg/dL — ABNORMAL LOW (ref >50)
LDL CALC: 97 mg/dL (ref <100)
Total CHOL/HDL Ratio: 4.3 Ratio (ref <5.0)
Triglycerides: 266 mg/dL — ABNORMAL HIGH (ref <150)
VLDL: 53 mg/dL — AB (ref <30)

## 2017-04-17 LAB — ALT: ALT: 30 U/L — ABNORMAL HIGH (ref 6–29)

## 2017-04-18 ENCOUNTER — Other Ambulatory Visit: Payer: Self-pay | Admitting: Family Medicine

## 2017-04-18 ENCOUNTER — Ambulatory Visit (INDEPENDENT_AMBULATORY_CARE_PROVIDER_SITE_OTHER): Payer: BLUE CROSS/BLUE SHIELD | Admitting: Family Medicine

## 2017-04-18 ENCOUNTER — Encounter: Payer: Self-pay | Admitting: Family Medicine

## 2017-04-18 VITALS — BP 140/76 | HR 97 | Temp 98.4°F | Resp 16 | Wt 150.0 lb

## 2017-04-18 DIAGNOSIS — I10 Essential (primary) hypertension: Secondary | ICD-10-CM | POA: Diagnosis not present

## 2017-04-18 DIAGNOSIS — M76891 Other specified enthesopathies of right lower limb, excluding foot: Secondary | ICD-10-CM | POA: Diagnosis not present

## 2017-04-18 DIAGNOSIS — Z720 Tobacco use: Secondary | ICD-10-CM

## 2017-04-18 DIAGNOSIS — Z7189 Other specified counseling: Secondary | ICD-10-CM | POA: Insufficient documentation

## 2017-04-18 DIAGNOSIS — E785 Hyperlipidemia, unspecified: Secondary | ICD-10-CM

## 2017-04-18 DIAGNOSIS — Z5181 Encounter for therapeutic drug level monitoring: Secondary | ICD-10-CM | POA: Diagnosis not present

## 2017-04-18 DIAGNOSIS — R109 Unspecified abdominal pain: Secondary | ICD-10-CM | POA: Insufficient documentation

## 2017-04-18 MED ORDER — VITAMIN B-12 500 MCG PO TABS: 500.0000 ug | ORAL_TABLET | Freq: Every day | ORAL | 3 refills | Status: DC

## 2017-04-18 NOTE — Assessment & Plan Note (Signed)
Praise given; her labs were actually drawn sooner than I wanted, but she showed significant improvement in her numbers; keep trying to eat better

## 2017-04-18 NOTE — Assessment & Plan Note (Signed)
Check BMP, add on to blood in lab; see if ACE-I well-tolerated

## 2017-04-18 NOTE — Assessment & Plan Note (Addendum)
Strong family hx; continue the ACE-I; check Cr and K+; DASH guidelines; cutting back on alcohol should be enough to get her systolic BP under 221

## 2017-04-18 NOTE — Patient Instructions (Addendum)
You might consider nicotine patch or gum as you try to quit Avoid vapor cigarettes I do encourage you to quit smoking Call [phone] to sign up for smoking cessation classes You can call 1-800-QUIT-NOW to talk with a smoking cessation coach I'll recommend that you really cut back on the alcohol; call me if I can help Start vitamin B12 daily Try the DASH guidelines, less salt and less processed foods Let's have you return in 2 months for recheck cholesterol and liver enzymes and kidney function; if all is fine then, we can see you every 6 months If you develop abdominal pain, nausea, vomiting, jaundice, loss of appetite, or other problems before your next appointment, please call us If you have not had a complete physical in the last 12 months, we'll be happy to do that for you in the near future; just schedule the appointment and we'll have a full visit for preventive care, pap smear, breast exam, heart, lungs, abdomen, skin, etc. Avoid salt substitutes Avoid black licorice Steps to Quit Smoking Smoking tobacco can be bad for your health. It can also affect almost every organ in your body. Smoking puts you and people around you at risk for many serious long-lasting (chronic) diseases. Quitting smoking is hard, but it is one of the best things that you can do for your health. It is never too late to quit. What are the benefits of quitting smoking? When you quit smoking, you lower your risk for getting serious diseases and conditions. They can include:  Lung cancer or lung disease.  Heart disease.  Stroke.  Heart attack.  Not being able to have children (infertility).  Weak bones (osteoporosis) and broken bones (fractures).  If you have coughing, wheezing, and shortness of breath, those symptoms may get better when you quit. You may also get sick less often. If you are pregnant, quitting smoking can help to lower your chances of having a baby of low birth weight. What can I do to help  me quit smoking? Talk with your doctor about what can help you quit smoking. Some things you can do (strategies) include:  Quitting smoking totally, instead of slowly cutting back how much you smoke over a period of time.  Going to in-person counseling. You are more likely to quit if you go to many counseling sessions.  Using resources and support systems, such as: ? Database administrator with a Social worker. ? Phone quitlines. ? Careers information officer. ? Support groups or group counseling. ? Text messaging programs. ? Mobile phone apps or applications.  Taking medicines. Some of these medicines may have nicotine in them. If you are pregnant or breastfeeding, do not take any medicines to quit smoking unless your doctor says it is okay. Talk with your doctor about counseling or other things that can help you.  Talk with your doctor about using more than one strategy at the same time, such as taking medicines while you are also going to in-person counseling. This can help make quitting easier. What things can I do to make it easier to quit? Quitting smoking might feel very hard at first, but there is a lot that you can do to make it easier. Take these steps:  Talk to your family and friends. Ask them to support and encourage you.  Call phone quitlines, reach out to support groups, or work with a Social worker.  Ask people who smoke to not smoke around you.  Avoid places that make you want (trigger) to smoke, such  as: ? Bars. ? Parties. ? Smoke-break areas at work.  Spend time with people who do not smoke.  Lower the stress in your life. Stress can make you want to smoke. Try these things to help your stress: ? Getting regular exercise. ? Deep-breathing exercises. ? Yoga. ? Meditating. ? Doing a body scan. To do this, close your eyes, focus on one area of your body at a time from head to toe, and notice which parts of your body are tense. Try to relax the muscles in those areas.  Download  or buy apps on your mobile phone or tablet that can help you stick to your quit plan. There are many free apps, such as QuitGuide from the [employer] Office manager for Disease Control and Prevention). You can find more support from smokefree.gov and other websites.  This information is not intended to replace advice given to you by your health care provider. Make sure you discuss any questions you have with your health care provider. Document Released: 06/15/2009 Document Revised: 04/16/2016 Document Reviewed: 01/03/2015 Elsevier Interactive Patient Education  2018 [employer].  Cholesterol Cholesterol is a fat. Your body needs a small amount of cholesterol. Cholesterol (plaque) may build up in your blood vessels (arteries). That makes you more likely to have a heart attack or stroke. You cannot feel your cholesterol level. Having a blood test is the only way to find out if your level is high. Keep your test results. Work with your doctor to keep your cholesterol at a good level. What do the results mean?  Total cholesterol is how much cholesterol is in your blood.  LDL is bad cholesterol. This is the type that can build up. Try to have low LDL.  HDL is good cholesterol. It cleans your blood vessels and carries LDL away. Try to have high HDL.  Triglycerides are fat that the body can store or burn for energy. What are good levels of cholesterol?  Total cholesterol below 200.  LDL below 100 is good for people who have health risks. LDL below 70 is good for people who have very high risks.  HDL above 40 is good. It is best to have HDL of 60 or higher.  Triglycerides below 150. How can I lower my cholesterol? Diet Follow your diet program as told by your doctor.  Choose fish, white meat chicken, or [place] that is roasted or baked. Try not to eat red meat, fried foods, sausage, or lunch meats.  Eat lots of fresh fruits and vegetables.  Choose whole grains, beans, pasta, potatoes, and  cereals.  Choose olive oil, corn oil, or canola oil. Only use small amounts.  Try not to eat butter, mayonnaise, shortening, or palm kernel oils.  Try not to eat foods with trans fats.  Choose low-fat or nonfat dairy foods. ? Drink skim or nonfat milk. ? Eat low-fat or nonfat yogurt and cheeses. ? Try not to drink whole milk or cream. ? Try not to eat ice cream, egg yolks, or full-fat cheeses.  Healthy desserts include angel food cake, ginger snaps, animal crackers, hard candy, popsicles, and low-fat or nonfat frozen yogurt. Try not to eat pastries, cakes, pies, and cookies.  Exercise Follow your exercise program as told by your doctor.  Be more active. Try gardening, walking, and taking the stairs.  Ask your doctor about ways that you can be more active.  Medicine  Take over-the-counter and prescription medicines only as told by your doctor.  This information is not  intended to replace advice given to you by your health care provider. Make sure you discuss any questions you have with your health care provider. Document Released: 11/15/2008 Document Revised: 03/20/2016 Document Reviewed: 02/29/2016 Elsevier Interactive Patient Education  2017 [employer].

## 2017-04-18 NOTE — Assessment & Plan Note (Signed)
She is working on this; Architect given; see AVS

## 2017-04-18 NOTE — Telephone Encounter (Signed)
I approved just one month, awaiting the BMP add on to make sure kidneys tolerated the new ACE-I; will send in more refills (6 months) if normal Cr and K+

## 2017-04-18 NOTE — Assessment & Plan Note (Signed)
Check urine today and also check Creatinine after addition of the ACE-I

## 2017-04-18 NOTE — Progress Notes (Signed)
BP 140/76   Pulse 97   Temp 98.4 F (36.9 C) (Oral)   Resp 16   Wt 150 lb (68 kg)   LMP 01/23/2015   SpO2 94%   BMI 24.96 kg/m    Subjective:    Patient ID: Jenna Newton, female    DOB: Mar 28, 1966, 51 y.o.   MRN: [id number]  HPI: Jenna Newton is a 51 y.o. female  Chief Complaint  Patient presents with  . 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; smokes less at home; just at breaks at work Gets up at 5:30 am and cigarette right away; last cig 30 minutes before bedtime; sometimes smokes around 2:30 am Did not tolerate vaping, burned her throat Drinks beer; on the weekends, at least a case between Friday and Sunday  BP Readings from Last 3 Encounters:  04/18/17 140/76  03/14/17 (!) 150/82  08/26/15 128/84  she has cut down on salt  She feels a little pulling down low left side; no blood in the urine; normal urine; no blood in the urine Going on for about 3 weeks; also pain in the front of the right hip  When she was having that heavy menstrual cycle, a few years ago; took iron and build it back up and now CBC normal; no more bleeding  Lab Results  Component Value Date   CHOL 196 04/16/2017   CHOL 272 (H) 03/14/2017   Lab Results  Component Value Date   HDL 46 (L) 04/16/2017   HDL 49 (L) 03/14/2017   Lab Results  Component Value Date   LDLCALC 97 04/16/2017   LDLCALC 183 (H) 03/14/2017   Lab Results  Component Value Date   TRIG 266 (H) 04/16/2017   TRIG 202 (H) 03/14/2017   Lab Results  Component Value Date   CHOLHDL 4.3 04/16/2017   CHOLHDL 5.6 (H) 03/14/2017   No  results found for: LDLDIRECT   Depression screen [facility] 2/9 04/18/2017 03/14/2017  Decreased Interest 0 0  Down, Depressed, Hopeless 0 0  PHQ - 2 Score 0 0    Relevant past medical, surgical, family and social history reviewed Past Medical History:  Diagnosis Date  . Anemia   . GERD (gastroesophageal reflux disease)   . Neuropathy   . Tobacco abuse 03/14/2017  . Vertigo    Past Surgical History:  Procedure Laterality Date  . none     Family History  Problem Relation Age of Onset  . Diabetes Mother   . Hypertension Mother   . Emphysema Mother   . Cancer Father   . Diabetes Maternal Grandmother   . Hypertension Maternal Grandmother   . Blindness Maternal Grandmother   . Cancer Paternal Grandmother   . Cancer Paternal Grandfather   . Diabetes Brother   . Cancer Maternal Grandfather        prostate  . Emphysema Maternal Grandfather    Social History   Social History  . Marital status: Single    Spouse name: N/A  . Number of children: N/A  . Years of education: N/A   Occupational  History  . Not on file.   Social History Main Topics  . Smoking status: Current Every Day Smoker  . Smokeless tobacco: Never Used  . Alcohol use 14.4 oz/week    24 Cans of beer per week  . Drug use: No  . Sexual activity: Yes   Other Topics Concern  . Not on file   Social History Narrative  . No narrative on file    Interim medical history since last visit reviewed. Allergies and medications reviewed  Review of Systems Per HPI unless specifically indicated above     Objective:    BP 140/76   Pulse 97   Temp 98.4 F (36.9 C) (Oral)   Resp 16   Wt 150 lb (68 kg)   LMP 01/23/2015   SpO2 94%   BMI 24.96 kg/m   Wt Readings from Last 3 Encounters:  04/18/17 150 lb (68 kg)  03/14/17 151 lb 12.8 oz (68.9 kg)  08/26/15 130 lb (59 kg)    Physical Exam  Constitutional: She appears well-developed and well-nourished. No distress.  Eyes: No scleral icterus.  Neck: No  thyromegaly present.  Cardiovascular: Normal rate and regular rhythm.   No murmur heard. Pulmonary/Chest: Effort normal and breath sounds normal.  Abdominal: Soft. Bowel sounds are normal. She exhibits no distension.  Musculoskeletal: She exhibits no edema.       Right hip: She exhibits decreased range of motion and tenderness.       Left hip: She exhibits normal range of motion and no tenderness.       Lumbar back: She exhibits decreased range of motion and tenderness. She exhibits no bony tenderness.       Back:  Pain in the anterior RIGHT hip with abduction and rotation in slight flexion  Neurological: She is alert.  Skin: No pallor.  Psychiatric: She has a normal mood and affect.    Results for orders placed or performed in visit on 04/16/17  Lipid panel  Result Value Ref Range   Cholesterol 196 <200 mg/dL   Triglycerides 266 (H) <150 mg/dL   HDL 46 (L) >50 mg/dL   Total CHOL/HDL Ratio 4.3 <5.0 Ratio   VLDL 53 (H) <30 mg/dL   LDL Cholesterol 97 <100 mg/dL  ALT  Result Value Ref Range   ALT 30 (H) 6 - 29 U/L      Assessment & Plan:   Problem List Items Addressed This Visit      Cardiovascular and Mediastinum   Benign hypertension - Primary    Strong family hx; continue the ACE-I; check Cr and K+; DASH guidelines; cutting back on alcohol should be enough to get her systolic BP under 185      Relevant Orders   Basic Metabolic Panel (BMET)   Microalbumin / creatinine urine ratio     Other   Tobacco abuse    She is working on this; Architect given; see AVS      Medication monitoring encounter    Check BMP, add on to blood in lab; see if ACE-I well-tolerated      Relevant Orders   Basic Metabolic Panel (BMET)   Dyslipidemia    Praise given; her labs were actually drawn sooner than I wanted, but she showed significant improvement in her numbers; keep trying to eat better      Acute left flank pain    Check urine today and also check Creatinine after addition of  the ACE-I      Relevant  Orders   Urinalysis w microscopic + reflex cultur    Other Visit Diagnoses    Hip flexor tendonitis, right       no xrays ordered today       Follow up plan: Return in about 2 months (around 06/18/2017) for twenty minute follow-up with fasting labs.  An after-visit summary was printed and given to the patient at [place].  Please see the patient instructions which may contain other information and recommendations beyond what is mentioned above in the assessment and plan.  Meds ordered this encounter  Medications  . vitamin B-12 (CYANOCOBALAMIN) 500 MCG tablet    Sig: Take 1 tablet (500 mcg total) by mouth daily.    Dispense:  100 tablet    Refill:  3    She'll use a [employer] card; okay to change to #30 plus 11 or #60 plus 6 or any other way you have it in stock; thank you    Orders Placed This Encounter  Procedures  . Basic Metabolic Panel (BMET)  . Urinalysis w microscopic + reflex cultur  . Microalbumin / creatinine urine ratio

## 2017-04-19 LAB — URINALYSIS W MICROSCOPIC + REFLEX CULTURE
Bacteria, UA: NONE SEEN [HPF]
Bilirubin Urine: NEGATIVE
Casts: NONE SEEN [LPF]
Crystals: NONE SEEN [HPF]
GLUCOSE, UA: NEGATIVE
HGB URINE DIPSTICK: NEGATIVE
KETONES UR: NEGATIVE
LEUKOCYTES UA: NEGATIVE
NITRITE: NEGATIVE
PH: 5.5 (ref 5.0–8.0)
Protein, ur: NEGATIVE
RBC / HPF: NONE SEEN RBC/HPF (ref <=2)
SPECIFIC GRAVITY, URINE: 1.007 (ref 1.001–1.035)
Squamous Epithelial / LPF: NONE SEEN [HPF] (ref <=5)
WBC UA: NONE SEEN WBC/HPF (ref <=5)
Yeast: NONE SEEN [HPF]

## 2017-04-19 LAB — MICROALBUMIN / CREATININE URINE RATIO
Creatinine, Urine: 26 mg/dL (ref 20–320)
Microalb, Ur: <0.2 mg/dL

## 2017-04-19 LAB — BASIC METABOLIC PANEL
BUN: 10 mg/dL (ref 7–25)
CHLORIDE: 102 mmol/L (ref 98–110)
CO2: 27 mmol/L (ref 20–32)
CREATININE: 0.51 mg/dL (ref 0.50–1.05)
Calcium: 10.1 mg/dL (ref 8.6–10.4)
GLUCOSE: 98 mg/dL (ref 65–99)
Potassium: 4.3 mmol/L (ref 3.5–5.3)
Sodium: 141 mmol/L (ref 135–146)

## 2017-06-19 ENCOUNTER — Ambulatory Visit (INDEPENDENT_AMBULATORY_CARE_PROVIDER_SITE_OTHER): Payer: BLUE CROSS/BLUE SHIELD | Admitting: Family Medicine

## 2017-06-19 ENCOUNTER — Encounter: Payer: Self-pay | Admitting: Family Medicine

## 2017-06-19 DIAGNOSIS — I10 Essential (primary) hypertension: Secondary | ICD-10-CM

## 2017-06-19 DIAGNOSIS — Z720 Tobacco use: Secondary | ICD-10-CM | POA: Diagnosis not present

## 2017-06-19 DIAGNOSIS — E785 Hyperlipidemia, unspecified: Secondary | ICD-10-CM | POA: Diagnosis not present

## 2017-06-19 MED ORDER — LOVASTATIN 20 MG PO TABS
20.0000 mg | ORAL_TABLET | Freq: Every day | ORAL | 6 refills | Status: DC
Start: 2017-06-19 — End: 2018-10-07

## 2017-06-19 MED ORDER — BUPROPION HCL 100 MG PO TABS: ORAL_TABLET | ORAL | 0 refills | Status: DC

## 2017-06-19 NOTE — Patient Instructions (Addendum)
I do encourage you to quit smoking Call [phone] to sign up for smoking cessation classes You can call 1-800-QUIT-NOW to talk with a smoking cessation coach   Steps to Quit Smoking Smoking tobacco can be bad for your health. It can also affect almost every organ in your body. Smoking puts you and people around you at risk for many serious long-lasting (chronic) diseases. Quitting smoking is hard, but it is one of the best things that you can do for your health. It is never too late to quit. What are the benefits of quitting smoking? When you quit smoking, you lower your risk for getting serious diseases and conditions. They can include:  Lung cancer or lung disease.  Heart disease.  Stroke.  Heart attack.  Not being able to have children (infertility).  Weak bones (osteoporosis) and broken bones (fractures).  If you have coughing, wheezing, and shortness of breath, those symptoms may get better when you quit. You may also get sick less often. If you are pregnant, quitting smoking can help to lower your chances of having a baby of low birth weight. What can I do to help me quit smoking? Talk with your doctor about what can help you quit smoking. Some things you can do (strategies) include:  Quitting smoking totally, instead of slowly cutting back how much you smoke over a period of time.  Going to in-person counseling. You are more likely to quit if you go to many counseling sessions.  Using resources and support systems, such as: ? Database administrator with a Social worker. ? Phone quitlines. ? Careers information officer. ? Support groups or group counseling. ? Text messaging programs. ? Mobile phone apps or applications.  Taking medicines. Some of these medicines may have nicotine in them. If you are pregnant or breastfeeding, do not take any medicines to quit smoking unless your doctor says it is okay. Talk with your doctor about counseling or other things that can help you.  Talk  with your doctor about using more than one strategy at the same time, such as taking medicines while you are also going to in-person counseling. This can help make quitting easier. What things can I do to make it easier to quit? Quitting smoking might feel very hard at first, but there is a lot that you can do to make it easier. Take these steps:  Talk to your family and friends. Ask them to support and encourage you.  Call phone quitlines, reach out to support groups, or work with a Social worker.  Ask people who smoke to not smoke around you.  Avoid places that make you want (trigger) to smoke, such as: ? Bars. ? Parties. ? Smoke-break areas at work.  Spend time with people who do not smoke.  Lower the stress in your life. Stress can make you want to smoke. Try these things to help your stress: ? Getting regular exercise. ? Deep-breathing exercises. ? Yoga. ? Meditating. ? Doing a body scan. To do this, close your eyes, focus on one area of your body at a time from head to toe, and notice which parts of your body are tense. Try to relax the muscles in those areas.  Download or buy apps on your mobile phone or tablet that can help you stick to your quit plan. There are many free apps, such as QuitGuide from the [employer] Office manager for Disease Control and Prevention). You can find more support from smokefree.gov and other websites.  This information is not  intended to replace advice given to you by your health care provider. Make sure you discuss any questions you have with your health care provider. Document Released: 06/15/2009 Document Revised: 04/16/2016 Document Reviewed: 01/03/2015 Elsevier Interactive Patient Education  2018 [employer].

## 2017-06-19 NOTE — Assessment & Plan Note (Signed)
Patient has stopped the statin; will try a different statin; if tolerated, continue and check fasting labs at next visit; if not tolerated, stop it and call me; proud of her changes to her diet

## 2017-06-19 NOTE — Assessment & Plan Note (Addendum)
Patient declined flu vaccine; offered PPSV-23 and she politely declined; offered bupropion; see AVS

## 2017-06-19 NOTE — Progress Notes (Signed)
BP 122/72 (BP Location: Left Arm, Patient Position: Sitting, Cuff Size: Normal)   Pulse 95   Temp 98.2 F (36.8 C) (Oral)   Ht 5\' 6"  (1.676 m)   Wt 149 lb 3.2 oz (67.7 kg)   LMP 01/23/2015   SpO2 98%   BMI 24.08 kg/m    Subjective:    Patient ID: Jenna Newton, female    DOB: 1966-05-23, 51 y.o.   MRN: [id number]  HPI: Jenna Newton is a 52 y.o. female  Chief Complaint  Patient presents with  . Follow-up   HPI Patient doing well Hypertension; well-controlled today; she just stopped, ran out of medicine, no side effects She has not really cut back on salt, but says she has cut back Not much fast food at all; cooks her own food Eating healthier High cholesterol; on statin but it is causing itching, so she stopped it Not ready to quit right now; still smoking; she knows I am here to help; however, the more we talked, the more she decided she was ready to quit if I can prescribe her something  Depression screen [facility] 2/9 06/19/2017 06/19/2017 04/18/2017 03/14/2017  Decreased Interest 0 0 0 0  Down, Depressed, Hopeless 0 0 0 0  PHQ - 2 Score 0 0 0 0   Relevant past medical, surgical, family and social history reviewed Past Medical History:  Diagnosis Date  . Anemia   . GERD (gastroesophageal reflux disease)   . Neuropathy   . Tobacco abuse 03/14/2017  . Vertigo    Past Surgical History:  Procedure Laterality Date  . none     Family History  Problem Relation Age of Onset  . Diabetes Mother   . Hypertension Mother   . Emphysema Mother   . Cancer Father   . Diabetes Maternal Grandmother   . Hypertension Maternal Grandmother   . Blindness Maternal Grandmother   . Cancer Paternal Grandmother   . Cancer Paternal Grandfather   . Diabetes Brother   . Alcohol abuse Brother   . Cancer Maternal Grandfather        prostate  . Emphysema Maternal Grandfather    Social History   Social History  . Marital status: Single    Spouse name: N/A  . Number of children: N/A    . Years of education: N/A   Occupational History  . Not on file.   Social History Main Topics  . Smoking status: Current Every Day Smoker    Packs/day: 0.50    Years: 25.00    Types: Cigarettes  . Smokeless tobacco: Never Used  . Alcohol use 14.4 oz/week    24 Cans of beer per week  . Drug use: No  . Sexual activity: Yes    Birth control/ protection: Post-menopausal   Other Topics Concern  . Not on file   Social History Narrative  . No narrative on file    Interim medical history since last visit reviewed. Allergies and medications reviewed  Review of Systems Per HPI unless specifically indicated above     Objective:    BP 122/72 (BP Location: Left Arm, Patient Position: Sitting, Cuff Size: Normal)   Pulse 95   Temp 98.2 F (36.8 C) (Oral)   Ht 5\' 6"  (1.676 m)   Wt 149 lb 3.2 oz (67.7 kg)   LMP 01/23/2015   SpO2 98%   BMI 24.08 kg/m   Wt Readings from Last 3 Encounters:  06/19/17 149 lb 3.2 oz (67.7 kg)  04/18/17  150 lb (68 kg)  03/14/17 151 lb 12.8 oz (68.9 kg)    Physical Exam  Constitutional: She appears well-developed and well-nourished.  HENT:  Mouth/Throat: Mucous membranes are normal.  Eyes: EOM are normal. No scleral icterus.  Cardiovascular: Normal rate and regular rhythm.   Pulmonary/Chest: Effort normal and breath sounds normal.  Musculoskeletal: She exhibits no edema.  Psychiatric: She has a normal mood and affect. Her behavior is normal.      Assessment & Plan:   Problem List Items Addressed This Visit      Cardiovascular and Mediastinum   Benign hypertension    Patient stopped the ACE-I; dietary changes on her own; continue current plan, no medicine, just DASH guidelines      Relevant Medications   lovastatin (MEVACOR) 20 MG tablet     Other   Tobacco abuse    Patient declined flu vaccine; offered PPSV-23 and she politely declined      Dyslipidemia    Patient has stopped the statin; will try a different statin; if tolerated,  continue and check fasting labs at next visit; if not tolerated, stop it and call me; proud of her changes to her diet      Relevant Medications   lovastatin (MEVACOR) 20 MG tablet       Follow up plan: Return in about 2 months (around 08/19/2017) for twenty minute follow-up with fasting labs.  An after-visit summary was printed and given to the patient at [place].  Please see the patient instructions which may contain other information and recommendations beyond what is mentioned above in the assessment and plan.  Meds ordered this encounter  Medications  . Aspirin-Caffeine (BC FAST PAIN RELIEF ARTHRITIS PO)    Sig: Take by mouth as needed.  . lovastatin (MEVACOR) 20 MG tablet    Sig: Take 1 tablet (20 mg total) by mouth at bedtime.    Dispense:  30 tablet    Refill:  6  . buPROPion (WELLBUTRIN) 100 MG tablet    Sig: One by mouth every morning for 3 days, then one twice a day (5:30 am and 2:30 pm)    Dispense:  53 tablet    Refill:  0    No orders of the defined types were placed in this encounter.

## 2017-06-19 NOTE — Assessment & Plan Note (Signed)
Patient stopped the ACE-I; dietary changes on her own; continue current plan, no medicine, just DASH guidelines

## 2017-09-26 ENCOUNTER — Ambulatory Visit: Payer: BLUE CROSS/BLUE SHIELD | Admitting: Family Medicine

## 2017-10-18 NOTE — Progress Notes (Signed)
Closing out lab/order note open since:  04/16/17

## 2017-10-18 NOTE — Progress Notes (Signed)
Signing off on old abstract note from 2018

## 2017-11-25 ENCOUNTER — Ambulatory Visit: Payer: BLUE CROSS/BLUE SHIELD | Admitting: Nurse Practitioner

## 2017-11-25 ENCOUNTER — Encounter: Payer: Self-pay | Admitting: Nurse Practitioner

## 2017-11-25 VITALS — BP 148/82 | HR 94 | Temp 98.2°F | Resp 16 | Ht 66.0 in | Wt 150.0 lb

## 2017-11-25 DIAGNOSIS — E785 Hyperlipidemia, unspecified: Secondary | ICD-10-CM | POA: Diagnosis not present

## 2017-11-25 DIAGNOSIS — Z720 Tobacco use: Secondary | ICD-10-CM | POA: Diagnosis not present

## 2017-11-25 DIAGNOSIS — H6121 Impacted cerumen, right ear: Secondary | ICD-10-CM

## 2017-11-25 DIAGNOSIS — H8113 Benign paroxysmal vertigo, bilateral: Secondary | ICD-10-CM

## 2017-11-25 DIAGNOSIS — I10 Essential (primary) hypertension: Secondary | ICD-10-CM | POA: Diagnosis not present

## 2017-11-25 MED ORDER — CARBAMIDE PEROXIDE 6.5 % OT SOLN: 5.0000 [drp] | Freq: Two times a day (BID) | OTIC | 0 refills | Status: DC

## 2017-11-25 MED ORDER — AMLODIPINE BESYLATE 5 MG PO TABS: 5.0000 mg | ORAL_TABLET | Freq: Every day | ORAL | 3 refills | Status: DC

## 2017-11-25 NOTE — Patient Instructions (Addendum)
It was a pleasure meeting you today! We are going to start you on a new blood pressure medication called amlodipine that you will take once a day; please monitor your BP and write them down at home like we discussed and bring it in with you at your one month follow-up. If it is not well-controlled call us and we can schedule an earlier appointment. Go back to healthy eating and cut down your smoking weekly like we discussed until you are no longer smoking! Below are some helpful hints to healthy eating and smoking cessation.   I am also sending you ear drops to help soften up the discharge in your ear, which may help with your dizziness. I also sent a referral to OT to help with your vertigo. You can try the meclizine again if you would like until you get in with OT for rehab.   DASH Eating Plan DASH stands for "Dietary Approaches to Stop Hypertension." The DASH eating plan is a healthy eating plan that has been shown to reduce high blood pressure (hypertension). It may also reduce your risk for type 2 diabetes, heart disease, and stroke. The DASH eating plan may also help with weight loss. What are tips for following this plan? General guidelines  Avoid eating more than 2,300 mg (milligrams) of salt (sodium) a day. If you have hypertension, you may need to reduce your sodium intake to 1,500 mg a day.  Limit alcohol intake to no more than 1 drink a day for nonpregnant women and 2 drinks a day for men. One drink equals 12 oz of beer, 5 oz of wine, or 1 oz of hard liquor.  Work with your health care provider to maintain a healthy body weight or to lose weight. Ask what an ideal weight is for you.  Get at least 30 minutes of exercise that causes your heart to beat faster (aerobic exercise) most days of the week. Activities may include walking, swimming, or biking.  Work with your health care provider or diet and nutrition specialist (dietitian) to adjust your eating plan to your individual calorie  needs. Reading food labels  Check food labels for the amount of sodium per serving. Choose foods with less than 5 percent of the Daily Value of sodium. Generally, foods with less than 300 mg of sodium per serving fit into this eating plan.  To find whole grains, look for the word "whole" as the first word in the ingredient list. Shopping  Buy products labeled as "low-sodium" or "no salt added."  Buy fresh foods. Avoid canned foods and premade or frozen meals. Cooking  Avoid adding salt when cooking. Use salt-free seasonings or herbs instead of table salt or sea salt. Check with your health care provider or pharmacist before using salt substitutes.  Do not fry foods. Cook foods using healthy methods such as baking, boiling, grilling, and broiling instead.  Cook with heart-healthy oils, such as olive, canola, soybean, or sunflower oil. Meal planning   Eat a balanced diet that includes: ? 5 or more servings of fruits and vegetables each day. At each meal, try to fill half of your plate with fruits and vegetables. ? Up to 6-8 servings of whole grains each day. ? Less than 6 oz of lean meat, poultry, or fish each day. A 3-oz serving of meat is about the same size as a deck of cards. One egg equals 1 oz. ? 2 servings of low-fat dairy each day. ? A serving of nuts,  seeds, or beans 5 times each week. ? Heart-healthy fats. Healthy fats called Omega-3 fatty acids are found in foods such as flaxseeds and coldwater fish, like sardines, salmon, and mackerel.  Limit how much you eat of the following: ? Canned or prepackaged foods. ? Food that is high in trans fat, such as fried foods. ? Food that is high in saturated fat, such as fatty meat. ? Sweets, desserts, sugary drinks, and other foods with added sugar. ? Full-fat dairy products.  Do not salt foods before eating.  Try to eat at least 2 vegetarian meals each week.  Eat more home-cooked food and less restaurant, buffet, and fast  food.  When eating at a restaurant, ask that your food be prepared with less salt or no salt, if possible. What foods are recommended? The items listed may not be a complete list. Talk with your dietitian about what dietary choices are best for you. Grains Whole-grain or whole-wheat bread. Whole-grain or whole-wheat pasta. Brown rice. [place]. Bulgur. Whole-grain and low-sodium cereals. Pita bread. Low-fat, low-sodium crackers. Whole-wheat flour tortillas. Vegetables Fresh or frozen vegetables (raw, steamed, roasted, or grilled). Low-sodium or reduced-sodium tomato and vegetable juice. Low-sodium or reduced-sodium tomato sauce and tomato paste. Low-sodium or reduced-sodium canned vegetables. Fruits All fresh, dried, or frozen fruit. Canned fruit in natural juice (without added sugar). Meat and other protein foods Skinless chicken or [place]. Ground chicken or [place]. Pork with fat trimmed off. Fish and seafood. Egg whites. Dried beans, peas, or lentils. Unsalted nuts, nut butters, and seeds. Unsalted canned beans. Lean cuts of beef with fat trimmed off. Low-sodium, lean deli meat. Dairy Low-fat (1%) or fat-free (skim) milk. Fat-free, low-fat, or reduced-fat cheeses. Nonfat, low-sodium ricotta or cottage cheese. Low-fat or nonfat yogurt. Low-fat, low-sodium cheese. Fats and oils Soft margarine without trans fats. Vegetable oil. Low-fat, reduced-fat, or light mayonnaise and salad dressings (reduced-sodium). Canola, safflower, olive, soybean, and sunflower oils. Avocado. Seasoning and other foods Herbs. Spices. Seasoning mixes without salt. Unsalted popcorn and pretzels. Fat-free sweets. What foods are not recommended? The items listed may not be a complete list. Talk with your dietitian about what dietary choices are best for you. Grains Baked goods made with fat, such as croissants, muffins, or some breads. Dry pasta or rice meal packs. Vegetables Creamed or fried vegetables. Vegetables  in a cheese sauce. Regular canned vegetables (not low-sodium or reduced-sodium). Regular canned tomato sauce and paste (not low-sodium or reduced-sodium). Regular tomato and vegetable juice (not low-sodium or reduced-sodium). Angie Fava. Olives. Fruits Canned fruit in a light or heavy syrup. Fried fruit. Fruit in cream or butter sauce. Meat and other protein foods Fatty cuts of meat. Ribs. Fried meat. Berniece Salines. Sausage. Bologna and other processed lunch meats. Salami. Fatback. Hotdogs. Bratwurst. Salted nuts and seeds. Canned beans with added salt. Canned or smoked fish. Whole eggs or egg yolks. Chicken or [place] with skin. Dairy Whole or 2% milk, cream, and half-and-half. Whole or full-fat cream cheese. Whole-fat or sweetened yogurt. Full-fat cheese. Nondairy creamers. Whipped toppings. Processed cheese and cheese spreads. Fats and oils Butter. Stick margarine. Lard. Shortening. Ghee. Bacon fat. Tropical oils, such as coconut, palm kernel, or palm oil. Seasoning and other foods Salted popcorn and pretzels. Onion salt, garlic salt, seasoned salt, table salt, and sea salt. Worcestershire sauce. Tartar sauce. Barbecue sauce. Teriyaki sauce. Soy sauce, including reduced-sodium. Steak sauce. Canned and packaged gravies. Fish sauce. Oyster sauce. Cocktail sauce. Horseradish that you find on the shelf. Ketchup. Mustard. Meat flavorings and  tenderizers. Bouillon cubes. Hot sauce and Tabasco sauce. Premade or packaged marinades. Premade or packaged taco seasonings. Relishes. Regular salad dressings. Where to find more information:  National Heart, Lung, and [place]: https://wilson-eaton.com/  American Heart Association: www.heart.org Summary  The DASH eating plan is a healthy eating plan that has been shown to reduce high blood pressure (hypertension). It may also reduce your risk for type 2 diabetes, heart disease, and stroke.  With the DASH eating plan, you should limit salt (sodium) intake to 2,300 mg a  day. If you have hypertension, you may need to reduce your sodium intake to 1,500 mg a day.  When on the DASH eating plan, aim to eat more fresh fruits and vegetables, whole grains, lean proteins, low-fat dairy, and heart-healthy fats.  Work with your health care provider or diet and nutrition specialist (dietitian) to adjust your eating plan to your individual calorie needs. This information is not intended to replace advice given to you by your health care provider. Make sure you discuss any questions you have with your health care provider. Document Released: 08/08/2011 Document Revised: 08/12/2016 Document Reviewed: 08/12/2016 Elsevier Interactive Patient Education  2018 [place] with Quitting Smoking Quitting smoking is a physical and mental challenge. You will face cravings, withdrawal symptoms, and temptation. Before quitting, work with your health care provider to make a plan that can help you cope. Preparation can help you quit and keep you from giving in. How can I cope with cravings? Cravings usually last for 5-10 minutes. If you get through it, the craving will pass. Consider taking the following actions to help you cope with cravings:  Keep your mouth busy: ? Chew sugar-free gum. ? Suck on hard candies or a straw. ? Brush your teeth.  Keep your hands and body busy: ? Immediately change to a different activity when you feel a craving. ? Squeeze or play with a ball. ? Do an activity or a hobby, like making bead jewelry, practicing needlepoint, or working with wood. ? Mix up your normal routine. ? Take a short exercise break. Go for a quick walk or run up and down stairs. ? Spend time in public places where smoking is not allowed.  Focus on doing something kind or helpful for someone else.  Call a friend or family member to talk during a craving.  Join a support group.  Call a quit line, such as 1-800-QUIT-NOW.  Talk with your health care provider about  medicines that might help you cope with cravings and make quitting easier for you.  How can I deal with withdrawal symptoms? Your body may experience negative effects as it tries to get used to not having nicotine in the system. These effects are called withdrawal symptoms. They may include:  Feeling hungrier than normal.  Trouble concentrating.  Irritability.  Trouble sleeping.  Feeling depressed.  Restlessness and agitation.  Craving a cigarette.  To manage withdrawal symptoms:  Avoid places, people, and activities that trigger your cravings.  Remember why you want to quit.  Get plenty of sleep.  Avoid coffee and other caffeinated drinks. These may worsen some of your symptoms.  How can I handle social situations? Social situations can be difficult when you are quitting smoking, especially in the first few weeks. To manage this, you can:  Avoid parties, bars, and other social situations where people might be smoking.  Avoid alcohol.  Leave right away if you have the urge to smoke.  Explain to your  family and friends that you are quitting smoking. Ask for understanding and support.  Plan activities with friends or family where smoking is not an option.  What are some ways I can cope with stress? Wanting to smoke may cause stress, and stress can make you want to smoke. Find ways to manage your stress. Relaxation techniques can help. For example:  Breathe slowly and deeply, in through your nose and out through your mouth.  Listen to soothing, relaxing music.  Talk with a family member or friend about your stress.  Light a candle.  Soak in a bath or take a shower.  Think about a peaceful place.  What are some ways I can prevent weight gain? Be aware that many people gain weight after they quit smoking. However, not everyone does. To keep from gaining weight, have a plan in place before you quit and stick to the plan after you quit. Your plan should  include:  Having healthy snacks. When you have a craving, it may help to: ? Eat plain popcorn, crunchy carrots, celery, or other cut vegetables. ? Chew sugar-free gum.  Changing how you eat: ? Eat small portion sizes at meals. ? Eat 4-6 small meals throughout the day instead of 1-2 large meals a day. ? Be mindful when you eat. Do not watch television or do other things that might distract you as you eat.  Exercising regularly: ? Make time to exercise each day. If you do not have time for a long workout, do short bouts of exercise for 5-10 minutes several times a day. ? Do some form of strengthening exercise, like weight lifting, and some form of aerobic exercise, like running or swimming.  Drinking plenty of water or other low-calorie or no-calorie drinks. Drink 6-8 glasses of water daily, or as much as instructed by your health care provider.  Summary  Quitting smoking is a physical and mental challenge. You will face cravings, withdrawal symptoms, and temptation to smoke again. Preparation can help you as you go through these challenges.  You can cope with cravings by keeping your mouth busy (such as by chewing gum), keeping your body and hands busy, and making calls to family, friends, or a helpline for people who want to quit smoking.  You can cope with withdrawal symptoms by avoiding places where people smoke, avoiding drinks with caffeine, and getting plenty of rest.  Ask your health care provider about the different ways to prevent weight gain, avoid stress, and handle social situations. This information is not intended to replace advice given to you by your health care provider. Make sure you discuss any questions you have with your health care provider. Document Released: 08/16/2016 Document Revised: 08/16/2016 Document Reviewed: 08/16/2016 Elsevier Interactive Patient Education  [employer].

## 2017-11-25 NOTE — Progress Notes (Addendum)
Name: Jenna Newton   MRN: [id number]    DOB: 1966/05/16   Date:11/25/2017       Progress Note  Subjective  Chief Complaint  Chief Complaint  Patient presents with  . Dizziness    pt states since feb., constant worse when walking or standing.  Has been dx with vertigo in past.    HPI   Hypertension Patient was last seen in office on 06/19/2017 and her lisinopril had been discontinued- as it was well-controlled at visit and she had made diet changes and been out of her ACEi prior to visit. States normally 140-155/98's in the past few weeks. Patient states diet has changed- she started to eat whatever she wanted to eat. Eats a lot of processed meats, and some fried foods. Does not add salt.    Tobacco use Smoking less than 0.5ppd.- Patient states she had tried pills to stop in the past but it did not work for her. - maybe wellbutrin. States she is willing to try.    Dizziness Patient endorses intermittent dizzy spells for the past month. Patient has had similar symptoms in the past and was started on meclizine back in 2018 but caused headaches so she stopped taking it. States episodes had self-resolved for a while and then came back. Patient state she's been having it every day. States noticed some triggers: going in and out of machines at work; states sometimes she can feel it starting and she closes her eye symptoms resolve but when she opens them they return. Patient sts episodes last all day but are intermittent. Endorses posterior pounding headache associated with symptoms at time; ocscasional palpitations and blurry vision and mild anxiety. Denies associated daytime somnolence, sleep apnea, falls, generalized weakness, diaphoresis, nausea, epigastric distress, hyperventilating. Patient states blood pressures during events have been between 892-119 systolic.   Hyperlipidemia Still taking lovastatin daily, denies SE. As mentioned earlier sts poor diet.  Lab Results  Component Value Date    CHOL 196 04/16/2017   HDL 46 (L) 04/16/2017   LDLCALC 97 04/16/2017   TRIG 266 (H) 04/16/2017   CHOLHDL 4.3 04/16/2017     Patient Active Problem List   Diagnosis Date Noted  . Medication monitoring encounter 04/18/2017  . Acute left flank pain 04/18/2017  . Benign hypertension 04/18/2017  . Dyslipidemia 04/18/2017  . Dry mouth 03/14/2017  . Neuropathy 03/14/2017  . Tobacco abuse 03/14/2017    Social History   Tobacco Use  . Smoking status: Current Every Day Smoker    Packs/day: 0.50    Years: 25.00    Pack years: 12.50    Types: Cigarettes  . Smokeless tobacco: Never Used  Substance Use Topics  . Alcohol use: Yes    Alcohol/week: 14.4 oz    Types: 24 Cans of beer per week     Current Outpatient Medications:  .  Aspirin-Caffeine (BC FAST PAIN RELIEF ARTHRITIS PO), Take by mouth as needed., Disp: , Rfl:  .  lovastatin (MEVACOR) 20 MG tablet, Take 1 tablet (20 mg total) by mouth at bedtime., Disp: 30 tablet, Rfl: 6 .  vitamin B-12 (CYANOCOBALAMIN) 500 MCG tablet, Take 1 tablet (500 mcg total) by mouth daily., Disp: 100 tablet, Rfl: 3 .  buPROPion (WELLBUTRIN) 100 MG tablet, One by mouth every morning for 3 days, then one twice a day (5:30 am and 2:30 pm) (Patient not taking: Reported on 11/25/2017), Disp: 53 tablet, Rfl: 0 .  ferrous sulfate 325 (65 FE) MG tablet, Take by mouth., Disp: ,  Rfl:   Allergies  Allergen Reactions  . Meclizine Other (See Comments)    Gave pt headaches     ROS  Constitutional: Negative for fever or weight change.  Respiratory: Positive for chronic cough and Negative shortness of breath.   Cardiovascular: Negative for chest pain or palpitations.  Gastrointestinal: Negative for abdominal pain, no bowel changes.  Musculoskeletal: Negative for gait problem or joint swelling.  Skin: Negative for rash.  Neurological: Positive for dizziness, off balance- no falls, posterior headache.  No other specific complaints in a complete review of  systems (except as listed in HPI above).  Objective  Vitals:   11/25/17 1015  BP: (!) 148/82  Pulse: 94  Resp: 16  Temp: 98.2 F (36.8 C)  TempSrc: Oral  SpO2: 95%  Weight: 150 lb (68 kg)  Height: 5\' 6"  (1.676 m)   Body mass index is 24.21 kg/m.  Nursing Note and Vital Signs reviewed.  Physical Exam  Constitutional: Patient appears well-developed and well-nourished. No distress.  HEENT: head atraumatic, normocephalic, pupils equal and reactive to light, EOM's intact, Left TM's without erythema or bulging, Right ear impacted with cerumen, no maxillary or frontal sinus tenderness , neck supple without lymphadenopathy, oropharynx pink and moist without exudate Cardiovascular: Normal rate, regular rhythm, S1/S2 present.  No murmur or rub heard. No BLE edema. Pulmonary/Chest: Effort normal and breath sounds clear. No respiratory distress or retractions. Abdominal: Soft and non-tender, bowel sounds present Neurological: Nystagmus noted + head-tilt, +romberg. Equal/strong upper and lower extremity strength, sensations intact, DTRs WNL Psychiatric: Patient has a normal mood and affect. behavior is normal. Judgment and thought content normal.  No results found for this or any previous visit (from the past 72 hour(s)).  Assessment & Plan  1. Benign hypertension - DASH diet - BP diary, bring to 1 month follow-up  - Quit smoking - amLODipine (NORVASC) 5 MG tablet; Take 1 tablet (5 mg total) by mouth daily.  Dispense: 90 tablet; Refill: 3  2. Benign paroxysmal positional vertigo due to bilateral vestibular disorder - Will try her meclizine OR benadryl at night if needed.  - Discussed safety  - Ambulatory referral to Occupational Therapy  3. Impacted cerumen of right ear - follow up if still impacted  - carbamide peroxide (DEBROX) 6.5 % OTIC solution; Place 5 drops into the right ear 2 (two) times daily.  Dispense: 15 mL; Refill: 0  4. Tobacco abuse - Discussed cessation- only  does a few ciggs a day, will continue to cut down until no longer smoking, tips and tricks sheet given.   5. Dyslipidemia - Continue statin - DASH diet; less fried and processed foods, more water.    -Red flags and when to present for emergency care or RTC including fever >101.60F, chest pain, shortness of breath, new/worsening/un-resolving symptoms, unilateral weakness, slurred speech, vision changes, syncope reviewed with patient at time of visit. Follow up and care instructions discussed and provided in AVS.  ----------------------------------------------------------- I will contact the patient about alcohol use I have reviewed this encounter including the documentation in this note and/or discussed this patient with the provider, Suezanne Cheshire DNP AGNP-C. I am certifying that I agree with the content of this note as supervising physician. Enid Derry, [facility] 11/26/2017, 4:55 PM

## 2017-11-26 ENCOUNTER — Telehealth: Payer: Self-pay | Admitting: Family Medicine

## 2017-11-26 NOTE — Telephone Encounter (Signed)
I will contact patient about ongoing alcohol use

## 2017-11-27 ENCOUNTER — Encounter: Payer: Self-pay | Admitting: Family Medicine

## 2017-11-27 NOTE — Telephone Encounter (Signed)
I spoke with the patient She is feeling a little better Having nosebleeds if coughing real hard or sneezing and blowing, right side only; no sore; not sure if maybe allergies, nose dried out I recommended Afrin use per package directions, THREE days only, can get OTC; can get addicted if used more than 3 days I asked about her alcohol, down to 3 a day; that is still a lot I explained; one a day is safe limit for women I offered referral to counselor; she says she drinks to relax; she will be in next month to discuss; she will keep cutting back I am here to help; she thanked me for calling

## 2017-11-28 ENCOUNTER — Telehealth: Payer: Self-pay | Admitting: Family Medicine

## 2017-11-28 ENCOUNTER — Encounter: Payer: Self-pay | Admitting: Emergency Medicine

## 2017-11-28 ENCOUNTER — Emergency Department
Admission: EM | Admit: 2017-11-28 | Discharge: 2017-11-29 | Disposition: A | Discharge status: Home / Self Care | Payer: BLUE CROSS/BLUE SHIELD | Attending: Emergency Medicine | Admitting: Emergency Medicine

## 2017-11-28 ENCOUNTER — Emergency Department: Payer: BLUE CROSS/BLUE SHIELD

## 2017-11-28 DIAGNOSIS — Z79899 Other long term (current) drug therapy: Secondary | ICD-10-CM | POA: Insufficient documentation

## 2017-11-28 DIAGNOSIS — I10 Essential (primary) hypertension: Secondary | ICD-10-CM | POA: Diagnosis not present

## 2017-11-28 DIAGNOSIS — F17210 Nicotine dependence, cigarettes, uncomplicated: Secondary | ICD-10-CM | POA: Diagnosis not present

## 2017-11-28 DIAGNOSIS — R51 Headache: Secondary | ICD-10-CM | POA: Insufficient documentation

## 2017-11-28 DIAGNOSIS — F10920 Alcohol use, unspecified with intoxication, uncomplicated: Secondary | ICD-10-CM | POA: Diagnosis not present

## 2017-11-28 DIAGNOSIS — R040 Epistaxis: Secondary | ICD-10-CM | POA: Diagnosis not present

## 2017-11-28 DIAGNOSIS — F10120 Alcohol abuse with intoxication, uncomplicated: Secondary | ICD-10-CM | POA: Diagnosis not present

## 2017-11-28 DIAGNOSIS — R519 Headache, unspecified: Secondary | ICD-10-CM

## 2017-11-28 LAB — CBC WITH DIFFERENTIAL/PLATELET
BASOS ABS: 0.1 10*3/uL (ref 0–0.1)
Basophils Relative: 1 %
EOS ABS: 0.1 10*3/uL (ref 0–0.7)
Eosinophils Relative: 1 %
HCT: 42.2 % (ref 35.0–47.0)
Hemoglobin: 14.4 g/dL (ref 12.0–16.0)
LYMPHS ABS: 4 10*3/uL — AB (ref 1.0–3.6)
LYMPHS PCT: 50 %
MCH: 32.1 pg (ref 26.0–34.0)
MCHC: 34.2 g/dL (ref 32.0–36.0)
MCV: 93.6 fL (ref 80.0–100.0)
Monocytes Absolute: 0.3 10*3/uL (ref 0.2–0.9)
Monocytes Relative: 4 %
NEUTROS ABS: 3.6 10*3/uL (ref 1.4–6.5)
Neutrophils Relative %: 44 %
PLATELETS: 240 10*3/uL (ref 150–440)
RBC: 4.51 MIL/uL (ref 3.80–5.20)
RDW: 13.7 % (ref 11.5–14.5)
WBC: 8.1 10*3/uL (ref 3.6–11.0)

## 2017-11-28 LAB — BASIC METABOLIC PANEL
ANION GAP: 11 (ref 5–15)
BUN: 8 mg/dL (ref 6–20)
CALCIUM: 9.1 mg/dL (ref 8.9–10.3)
CO2: 24 mmol/L (ref 22–32)
Chloride: 97 mmol/L — ABNORMAL LOW (ref 101–111)
Creatinine, Ser: 0.43 mg/dL — ABNORMAL LOW (ref 0.44–1.00)
GFR calc Af Amer: >60 mL/min (ref >60)
GLUCOSE: 107 mg/dL — AB (ref 65–99)
Potassium: 3.8 mmol/L (ref 3.5–5.1)
SODIUM: 132 mmol/L — AB (ref 135–145)

## 2017-11-28 LAB — ETHANOL: ALCOHOL ETHYL (B): 280 mg/dL — AB (ref <10)

## 2017-11-28 LAB — PROTIME-INR
INR: 1.06
PROTHROMBIN TIME: 13.7 s (ref 11.4–15.2)

## 2017-11-28 MED ORDER — METOCLOPRAMIDE HCL 5 MG/ML IJ SOLN
INTRAMUSCULAR | Status: AC
  Administered 2017-11-28: 10 mg via INTRAVENOUS
  Filled 2017-11-28: qty 2

## 2017-11-28 MED ORDER — DIPHENHYDRAMINE HCL 50 MG/ML IJ SOLN
25.0000 mg | Freq: Once | INTRAMUSCULAR | Status: AC
  Administered 2017-11-28: 25 mg via INTRAVENOUS

## 2017-11-28 MED ORDER — METOCLOPRAMIDE HCL 5 MG/ML IJ SOLN
10.0000 mg | Freq: Once | INTRAMUSCULAR | Status: AC
  Administered 2017-11-28: 10 mg via INTRAVENOUS

## 2017-11-28 MED ORDER — SODIUM CHLORIDE 0.9 % IV BOLUS
1000.0000 mL | Freq: Once | INTRAVENOUS | Status: AC
  Administered 2017-11-28: 1000 mL via INTRAVENOUS

## 2017-11-28 MED ORDER — DIPHENHYDRAMINE HCL 50 MG/ML IJ SOLN
INTRAMUSCULAR | Status: AC
  Administered 2017-11-28: 25 mg via INTRAVENOUS
  Filled 2017-11-28: qty 1

## 2017-11-28 NOTE — ED Triage Notes (Addendum)
Patient with complaint of high blood pressure, headache and nose bleeding everyday times one month.Patient reports seeing her pcp on Tuesday and was started on bp medications. Patient states that her 155/104 this morning. Patient with no bleeding at this time. Patient

## 2017-11-28 NOTE — ED Notes (Signed)
Pt reports recurrent nosebleeds for the last month. Pt also reports HA's as well. Pt states she followed up with her Dr and was told to use afrin, pt states that helps with the nosebleeds. Pt states she has hx of high BP, states her BP was "167/101" recently and that is high for her. Pt was told to try the afrin for 3 days then return to her PCP for follow up. No nosebleed at this time, pt A&O at this time. Pt denies being on blood thinners.

## 2017-11-28 NOTE — ED Notes (Signed)
Blue top re-drawn and re-sent.

## 2017-11-28 NOTE — Telephone Encounter (Signed)
Pt. Called to report she is still having nose bleeds - one in the mornings and sometimes at night. Has not tried the Afrin. Will try that and let us know if it does not help.

## 2017-11-28 NOTE — ED Notes (Signed)
Patient taken to ED26 by this RN.  Patient belongings include pocketbook, and pair of crocs.

## 2017-11-28 NOTE — ED Provider Notes (Signed)
[facility] Emergency Department Provider Note ____________________________________________   First MD Initiated Contact with Patient 11/28/17 2133     (approximate)  I have reviewed the triage vital signs and the nursing notes.   HISTORY  Chief Complaint Epistaxis and Headache    HPI Jenna Newton is a 52 y.o. female with past medical history as noted below who presents with nosebleed, acute onset at noon today and resolving after several hours, mainly out of the right nostril, and occurring almost daily for the last month.  The patient saw her doctor about this a few days ago and was prescribed Afrin, which she only tried for the first time today.  She also states she has had elevated blood pressure and recently started blood pressure medication.  In addition to the nosebleed, the patient reports headache which is primarily occipital, throbbing, and has been occurring for the last several weeks constantly.  No prior history of a headache like this.  No other recent abnormal bruising or bleeding.   Past Medical History:  Diagnosis Date  . Anemia   . GERD (gastroesophageal reflux disease)   . Neuropathy   . Tobacco abuse 03/14/2017  . Vertigo     Patient Active Problem List   Diagnosis Date Noted  . Medication monitoring encounter 04/18/2017  . Acute left flank pain 04/18/2017  . Benign hypertension 04/18/2017  . Dyslipidemia 04/18/2017  . Dry mouth 03/14/2017  . Neuropathy 03/14/2017  . Tobacco abuse 03/14/2017    Past Surgical History:  Procedure Laterality Date  . none      Prior to Admission medications   Medication Sig Start Date End Date Taking? Authorizing Provider  amLODipine (NORVASC) 5 MG tablet Take 1 tablet (5 mg total) by mouth daily. 11/25/17   Poulose, Bethel Born, NP  Aspirin-Caffeine (BC FAST PAIN RELIEF ARTHRITIS PO) Take by mouth as needed.    [provider]  carbamide peroxide (DEBROX) 6.5 % OTIC solution Place 5  drops into the right ear 2 (two) times daily. 11/25/17   Poulose, Bethel Born, NP  lovastatin (MEVACOR) 20 MG tablet Take 1 tablet (20 mg total) by mouth at bedtime. 06/19/17   Arnetha Courser, MD  vitamin B-12 (CYANOCOBALAMIN) 500 MCG tablet Take 1 tablet (500 mcg total) by mouth daily. 04/18/17   Arnetha Courser, MD    Allergies Meclizine  Family History  Problem Relation Age of Onset  . Diabetes Mother   . Hypertension Mother   . Emphysema Mother   . Cancer Father   . Diabetes Maternal Grandmother   . Hypertension Maternal Grandmother   . Blindness Maternal Grandmother   . Cancer Paternal Grandmother   . Cancer Paternal Grandfather   . Diabetes Brother   . Alcohol abuse Brother   . Cancer Maternal Grandfather        prostate  . Emphysema Maternal Grandfather     Social History Social History   Tobacco Use  . Smoking status: Current Every Day Smoker    Packs/day: 0.50    Years: 25.00    Pack years: 12.50    Types: Cigarettes  . Smokeless tobacco: Never Used  Substance Use Topics  . Alcohol use: Yes    Alcohol/week: 8.4 oz    Types: 14 Cans of beer per week  . Drug use: No    Review of Systems  Constitutional: No fever. Eyes: No redness. ENT: Positive for epistaxis. Cardiovascular: Denies chest pain. Respiratory: Denies shortness of breath. Gastrointestinal: No  vomiting.  Genitourinary: Negative for hematuria.  Musculoskeletal: Negative for back pain. Skin: Negative for rash. Neurological: Positive for headache.   ____________________________________________   PHYSICAL EXAM:  VITAL SIGNS: ED Triage Vitals  Enc Vitals Group     BP 11/28/17 2059 114/74     Pulse Rate 11/28/17 2058 95     Resp 11/28/17 2058 18     Temp 11/28/17 2058 98.6 F (37 C)     Temp src --      SpO2 11/28/17 2058 95 %     Weight 11/28/17 2059 150 lb (68 kg)     Height 11/28/17 2059 5\' 6"  (1.676 m)     Head Circumference --      Peak Flow --      Pain Score 11/28/17 2058 8      Pain Loc --      Pain Edu? --      Excl. in [place]? --     Constitutional: Alert and oriented.  Relatively well appearing and in no acute distress. Eyes: Conjunctivae are normal.  EOMI. Head: Atraumatic. Nose: No congestion/rhinnorhea.  Nasal mucosa normal-appearing.  No hemorrhage. Mouth/Throat: Mucous membranes are moist.  Oropharynx clear. Neck: Normal range of motion.  Cardiovascular: Good peripheral circulation. Respiratory: Normal respiratory effort.  Gastrointestinal: No distention.  Musculoskeletal: Extremities warm and well perfused.  Neurologic:  Normal speech and language.  Motor intact in all extremities.  Normal coordination.  No gross focal neurologic deficits are appreciated.  Skin:  Skin is warm and dry. No rash noted. Psychiatric: Mood and affect are normal. Speech and behavior are normal.  Mildly intoxicated appearing.  ____________________________________________   LABS (all labs ordered are listed, but only abnormal results are displayed)  Labs Reviewed  BASIC METABOLIC PANEL - Abnormal; Notable for the following components:      Result Value   Sodium 132 (*)    Chloride 97 (*)    Glucose, Bld 107 (*)    Creatinine, Ser 0.43 (*)    All other components within normal limits  CBC WITH DIFFERENTIAL/PLATELET - Abnormal; Notable for the following components:   Lymphs Abs 4.0 (*)    All other components within normal limits  ETHANOL - Abnormal; Notable for the following components:   Alcohol, Ethyl (B) 280 (*)    All other components within normal limits  PROTIME-INR   ____________________________________________  EKG   ____________________________________________  RADIOLOGY  CT head: No ICH or other acute findings  ____________________________________________   PROCEDURES  Procedure(s) performed: No  Procedures  Critical Care performed: No ____________________________________________   INITIAL IMPRESSION / ASSESSMENT AND PLAN / ED  COURSE  Pertinent labs & imaging results that were available during my care of the patient were reviewed by me and considered in my medical decision making (see chart for details).  51 year old female with past medical history as noted above presents with recurrent nosebleeds over the last month, including a nosebleed lasting several hours today.  Patient saw her primary care about it earlier this week and was started on Afrin but only took 1 dose today.  She has also had elevated blood pressure, and reports a headache for 3 weeks.  On exam, vitals including the blood pressure are normal, the patient is relatively well-appearing, and the remainder the exam is as described above.  There is no active bleeding or abnormality to the nasal mucosa at this time.  Neuro exam is nonfocal.  Overall presentation is most consistent with relatively benign anterior epistaxis.  Given that the patient has no active bleeding at this time, there is no indication for further intervention acutely.  Her blood pressure is also normal at this time.  The subacute headache is also likely benign given the description of the symptoms and the normal neuro exam, however based on shared decision making with the patient given she has no significant headache history in the past we will obtain a CT.  I will also obtain basic labs and INR to evaluate for anemia or coagulation abnormality.  We will treat symptomatically for the headache.  If work-up is negative I anticipate discharge home.    ----------------------------------------- 10:43 PM on 11/28/2017 -----------------------------------------  Lab workup is reassuring.  CT is negative for acute findings.  We had obtained an EtOH level given that the patient seemed mildly intoxicated and it is significantly elevated.  On reassessment, the patient appears more intoxicated and is arousable but still somewhat confused.  She was accompanied by family member but he is not here  currently.  I suspect that this is a combination of the alcohol and the Benadryl given along with the Reglan.  The patient is stable, but in her current state I cannot give her discharge instructions or return precautions.  The patient will be moved to Main ED to observe for sobriety, with the plan to discharge when she is sober.   I am signing the patient out to the oncoming physician Dr. Mable Paris to observe for sobriety and discharge.  ____________________________________________   FINAL CLINICAL IMPRESSION(S) / ED DIAGNOSES  Final diagnoses:  Epistaxis  Nonintractable headache, unspecified chronicity pattern, unspecified headache type  Alcoholic intoxication without complication ([place])      NEW MEDICATIONS STARTED DURING THIS VISIT:  New Prescriptions   No medications on file     Note:  This document was prepared using Dragon voice recognition software and may include unintentional dictation errors.     Arta Silence, MD 11/28/17 2248

## 2017-11-28 NOTE — Discharge Instructions (Signed)
Follow-up with your primary care doctor, Dr. Marzetta Board as scheduled.  Continue to take the blood pressure medication as prescribed, and you may use the Afrin nasal spray for up to the next 2 days.  Return to the emergency department for new, worsening, persistent nosebleed, or persistent severe headache, or any other new or worsening symptoms that concern you.  We have also provided a referral to an ENT (ear, nose, and throat specialist) for you to see for these continued nosebleeds.

## 2017-11-29 NOTE — ED Notes (Signed)

## 2017-11-29 NOTE — ED Notes (Signed)
Pt's significant other here to take pt home

## 2017-12-04 ENCOUNTER — Ambulatory Visit: Payer: BLUE CROSS/BLUE SHIELD | Admitting: Family Medicine

## 2017-12-04 ENCOUNTER — Encounter: Payer: Self-pay | Admitting: Family Medicine

## 2017-12-04 VITALS — BP 120/70 | HR 86 | Temp 98.4°F | Resp 16 | Ht 66.0 in | Wt 147.2 lb

## 2017-12-04 DIAGNOSIS — I998 Other disorder of circulatory system: Secondary | ICD-10-CM | POA: Diagnosis not present

## 2017-12-04 DIAGNOSIS — I10 Essential (primary) hypertension: Secondary | ICD-10-CM | POA: Diagnosis not present

## 2017-12-04 DIAGNOSIS — M25551 Pain in right hip: Secondary | ICD-10-CM | POA: Diagnosis not present

## 2017-12-04 DIAGNOSIS — Z720 Tobacco use: Secondary | ICD-10-CM

## 2017-12-04 DIAGNOSIS — Z789 Other specified health status: Secondary | ICD-10-CM

## 2017-12-04 DIAGNOSIS — F1090 Alcohol use, unspecified, uncomplicated: Secondary | ICD-10-CM | POA: Insufficient documentation

## 2017-12-04 DIAGNOSIS — G8929 Other chronic pain: Secondary | ICD-10-CM

## 2017-12-04 DIAGNOSIS — R9082 White matter disease, unspecified: Secondary | ICD-10-CM

## 2017-12-04 DIAGNOSIS — R040 Epistaxis: Secondary | ICD-10-CM | POA: Diagnosis not present

## 2017-12-04 DIAGNOSIS — Z7289 Other problems related to lifestyle: Secondary | ICD-10-CM | POA: Insufficient documentation

## 2017-12-04 DIAGNOSIS — E785 Hyperlipidemia, unspecified: Secondary | ICD-10-CM | POA: Diagnosis not present

## 2017-12-04 HISTORY — DX: Other disorder of circulatory system: I99.8

## 2017-12-04 HISTORY — DX: Other disorder of circulatory system: R90.82

## 2017-12-04 NOTE — Patient Instructions (Addendum)
I do encourage you to quit smoking Call [phone] to sign up for smoking cessation classes You can call 1-800-QUIT-NOW to talk with a smoking cessation coach Consider keeping some OB tampons and a little lubrication if you get a bad nosebleed; go to the ER Try to NOT rub in the nose Apply a tiny bit of antibacterial ointment with a qtip gently and then leave alone  Alcohol Use Disorder Alcohol use disorder is when your drinking disrupts your daily life. When you have this condition, you drink too much alcohol and you cannot control your drinking. Alcohol use disorder can cause serious problems with your physical health. It can affect your brain, heart, liver, pancreas, immune system, stomach, and intestines. Alcohol use disorder can increase your risk for certain cancers and cause problems with your mental health, such as depression, anxiety, psychosis, delirium, and dementia. People with this disorder risk hurting themselves and others. What are the causes? This condition is caused by drinking too much alcohol over time. It is not caused by drinking too much alcohol only one or two times. Some people with this condition drink alcohol to cope with or escape from negative life events. Others drink to relieve pain or symptoms of mental illness. What increases the risk? You are more likely to develop this condition if:  You have a family history of alcohol use disorder.  Your culture encourages drinking to the point of intoxication, or makes alcohol easy to get.  You had a mood or conduct disorder in childhood.  You have been a victim of abuse.  You are an adolescent and: ? You have poor grades or difficulties in school. ? Your caregivers do not talk to you about saying no to alcohol, or supervise your activities. ? You are impulsive or you have trouble with self-control.  What are the signs or symptoms? Symptoms of this condition include:  Drinkingmore than you want to.  Drinking  for longer than you want to.  Trying several times to drink less or to control your drinking.  Spending a lot of time getting alcohol, drinking, or recovering from drinking.  Craving alcohol.  Having problems at work, at school, or at home due to drinking.  Having problems in relationships due to drinking.  Drinking when it is dangerous to drink, such as before driving a car.  Continuing to drink even though you know you might have a physical or mental problem related to drinking.  Needing more and more alcohol to get the same effect you want from the alcohol (building up tolerance).  Having symptoms of withdrawal when you stop drinking. Symptoms of withdrawal include: ? Fatigue. ? Nightmares. ? Trouble sleeping. ? Depression. ? Anxiety. ? Fever. ? Seizures. ? Severe confusion. ? Feeling or seeing things that are not there (hallucinations). ? Tremors. ? Rapid heart rate. ? Rapid breathing. ? High blood pressure.  Drinking to avoid symptoms of withdrawal.  How is this diagnosed? This condition is diagnosed with an assessment. Your health care provider may start the assessment by asking three or four questions about your drinking. Your health care provider may perform a physical exam or do lab tests to see if you have physical problems resulting from alcohol use. She or he may refer you to a mental health professional for evaluation. How is this treated? Some people with alcohol use disorder are able to reduce their alcohol use to low-risk levels. Others need to completely quit drinking alcohol. When necessary, mental health professionals with specialized  training in substance use treatment can help. Your health care provider can help you decide how severe your alcohol use disorder is and what type of treatment you need. The following forms of treatment are available:  Detoxification. Detoxification involves quitting drinking and using prescription medicines within the first  week to help lessen withdrawal symptoms. This treatment is important for people who have had withdrawal symptoms before and for heavy drinkers who are likely to have withdrawal symptoms. Alcohol withdrawal can be dangerous, and in severe cases, it can cause death. Detoxification may be provided in a home, community, or primary care setting, or in a hospital or substance use treatment facility.  Counseling. This treatment is also called talk therapy. It is provided by substance use treatment counselors. A counselor can address the reasons you use alcohol and suggest ways to keep you from drinking again or to prevent problem drinking. The goals of talk therapy are to: ? Find healthy activities and ways for you to cope with stress. ? Identify and avoid the things that trigger your alcohol use. ? Help you learn how to handle cravings.  Medicines.Medicines can help treat alcohol use disorder by: ? Decreasing alcohol cravings. ? Decreasing the positive feeling you have when you drink alcohol. ? Causing an uncomfortable physical reaction when you drink alcohol (aversion therapy).  Support groups. Support groups are led by people who have quit drinking. They provide emotional support, advice, and guidance.  These forms of treatment are often combined. Some people with this condition benefit from a combination of treatments provided by specialized substance use treatment centers. Follow these instructions at home:  Take over-the-counter and prescription medicines only as told by your health care provider.  Check with your health care provider before starting any new medicines.  Ask friends and family members not to offer you alcohol.  Avoid situations where alcohol is served, including gatherings where others are drinking alcohol.  Create a plan for what to do when you are tempted to use alcohol.  Find hobbies or activities that you enjoy that do not include alcohol.  Keep all follow-up visits  as told by your health care provider. This is important. How is this prevented?  If you drink, limit alcohol intake to no more than 1 drink a day for nonpregnant women and 2 drinks a day for men. One drink equals 12 oz of beer, 5 oz of wine, or 1 oz of hard liquor.  If you have a mental health condition, get treatment and support.  Do not give alcohol to adolescents.  If you are an adolescent: ? Do not drink alcohol. ? Do not be afraid to say no if someone offers you alcohol. Speak up about why you do not want to drink. You can be a positive role model for your friends and set a good example for those around you by not drinking alcohol. ? If your friends drink, spend time with others who do not drink alcohol. Make new friends who do not use alcohol. ? Find healthy ways to manage stress and emotions, such as meditation or deep breathing, exercise, spending time in nature, listening to music, or talking with a trusted friend or family member. Contact a health care provider if:  You are not able to take your medicines as told.  Your symptoms get worse.  You return to drinking alcohol (relapse) and your symptoms get worse. Get help right away if:  You have thoughts about hurting yourself or others. If you ever  feel like you may hurt yourself or others, or have thoughts about taking your own life, get help right away. You can go to your nearest emergency department or call:  Your local emergency services (911 in the [place]  A suicide crisis helpline, such as the [facility] at 3055161940. This is open 24 hours a day.  Summary  Alcohol use disorder is when your drinking disrupts your daily life. When you have this condition, you drink too much alcohol and you cannot control your drinking.  Treatment may include detoxification, counseling, medicine, and support groups.  Ask friends and family members not to offer you alcohol. Avoid situations where alcohol  is served.  Get help right away if you have thoughts about hurting yourself or others. This information is not intended to replace advice given to you by your health care provider. Make sure you discuss any questions you have with your health care provider. Document Released: 09/26/2004 Document Revised: 05/16/2016 Document Reviewed: 05/16/2016 Elsevier Interactive Patient Education  [employer].  12 Ways to [employer] Anxiety  ?Anxiety is normal human sensation. It is what helped our ancestors survive the pitfalls of the wilderness. Anxiety is defined as experiencing worry or nervousness about an imminent event or something with an uncertain outcome. It is a feeling experienced by most people at some point in their lives. Anxiety can be triggered by a very personal issue, such as the illness of a loved one, or an event of global proportions, such as a refugee crisis. Some of the symptoms of anxiety are:  Feeling restless.  Having a feeling of impending danger.  Increased heart rate.  Rapid breathing. Sweating.  Shaking.  Weakness or feeling tired.  Difficulty concentrating on anything except the current worry.  Insomnia.  Stomach or bowel problems. What can we do about anxiety we may be feeling? There are many techniques to help manage stress and relax. Here are 12 ways you can reduce your anxiety almost immediately: 1. Turn off the constant feed of information. Take a social media sabbatical. Studies have shown that social media directly contributes to social anxiety.  2. Monitor your television viewing habits. Are you watching shows that are also contributing to your anxiety, such as 24-hour news stations? Try watching something else, or better yet, nothing at all. Instead, listen to music, read an inspirational book or practice a hobby. 3. Eat nutritious meals. Also, don't skip meals and keep healthful snacks on hand. Hunger and poor diet contributes to feeling anxious. 4. Sleep.  Sleeping on a regular schedule for at least seven to eight hours a night will do wonders for your outlook when you are awake. 5. Exercise. Regular exercise will help rid your body of that anxious energy and help you get more restful sleep. 6. Try deep (diaphragmatic) breathing. Inhale slowly through your nose for five seconds and exhale through your mouth. 7. Practice acceptance and gratitude. When anxiety hits, accept that there are things out of your control that shouldn't be of immediate concern.  8. Seek out humor. When anxiety strikes, watch a funny video, read jokes or call a friend who makes you laugh. Laughter is healing for our bodies and releases endorphins that are calming. 9. Stay positive. Take the effort to replace negative thoughts with positive ones. Try to see a stressful situation in a positive light. Try to come up with solutions rather than dwelling on the problem. 10. Figure out what triggers your anxiety. Keep a journal and  make note of anxious moments and the events surrounding them. This will help you identify triggers you can avoid or even eliminate. 11. Talk to someone. Let a trusted friend, family member or even trained professional know that you are feeling overwhelmed and anxious. Verbalize what you are feeling and why.  12. Volunteer. If your anxiety is triggered by a crisis on a large scale, become an advocate and work to resolve the problem that is causing you unease. Anxiety is often unwelcome and can become overwhelming. If not kept in check, it can become a disorder that could require medical treatment. However, if you take the time to care for yourself and avoid the triggers that make you anxious, you will be able to find moments of relaxation and clarity that make your life much more enjoyable.

## 2017-12-04 NOTE — Assessment & Plan Note (Signed)
See AVS; I am here for her and can refer to [facility] substance abuse counselor

## 2017-12-04 NOTE — Assessment & Plan Note (Signed)
Due to smoking, alcohol, high cholesterol (which she has really brought down), HTN; BP excellent control today; encouraged healthy lifestyle habits

## 2017-12-04 NOTE — Assessment & Plan Note (Signed)
Reviewed last two lipid panel and she has really brought down her numbers; so important because of the head CT findings

## 2017-12-04 NOTE — Progress Notes (Signed)
BP 120/70   Pulse 86   Temp 98.4 F (36.9 C) (Oral)   Resp 16   Ht 5\' 6"  (1.676 m)   Wt 147 lb 3.2 oz (66.8 kg)   LMP 01/23/2015   SpO2 93%   BMI 23.76 kg/m    Subjective:    Patient ID: Jenna Newton, female    DOB: Jun 03, 1966, 52 y.o.   MRN: [id number]  HPI: Jenna Newton is a 52 y.o. female  Chief Complaint  Patient presents with  . Nose Problem    Nose Bleeds started 2 weeks ago at work. Pt states occurs once a day for about 30-45 mins a light stream of blood. Pt state that Tuesday bleed from 3:15pm-5:00pm.   . Hip Pain    Pt states pain in right hip is occuring more often and more severe with it reach up to a 10 on the pain scale.     HPI Patient is here for f/u She has been having nosebleeds; started 2 weeks ago at work; only just the right side It will happen for 30 minutes or more; on Tuesday, it bled for an hour and 45 minutes She was seen in the ER on 11/28/2017 for epistaxis; she had a head CT; INR, basic labs; she was intoxicated in the ER and had to wait until she could be discharged I talked with her about her alcohol intake; her level was 280 in the ER; has a lot on her right now; she can stop  Head CT 11/28/2017: EXAM: CT HEAD WITHOUT CONTRAST  TECHNIQUE: Contiguous axial images were obtained from the base of the skull through the vertex without intravenous contrast.  COMPARISON:  Head CT dated 08/26/2015  FINDINGS: Brain: The ventricles and sulci appropriate size for patient's age. Minimal periventricular and deep white matter chronic microvascular ischemic changes. There is no acute intracranial hemorrhage. No mass effect or midline shift. No extra-axial fluid collection.  Vascular: No hyperdense vessel or unexpected calcification.  Skull: Normal. Negative for fracture or focal lesion.  Sinuses/Orbits: No acute finding.  Other: None  IMPRESSION: No acute intracranial pathology.   Electronically Signed   By: Anner Crete  M.D.   On: 11/28/2017 22:35  She is having more hip pain, going all the way up to a 10 out of 10 pain at times; right hip; arthritis in the family; no old injuries; broke one of her legs, thinks she broke the left leg years ago; has neuropathy on the right; tears her to pieces but she doesn't know why  High cholesterol; on statin and numbers have really improved Lab Results  Component Value Date   CHOL 196 04/16/2017   HDL 46 (L) 04/16/2017   LDLCALC 97 04/16/2017   TRIG 266 (H) 04/16/2017   CHOLHDL 4.3 04/16/2017    Depression screen PHQ 2/9 12/04/2017 11/25/2017 06/19/2017 06/19/2017 04/18/2017  Decreased Interest 0 0 0 0 0  Down, Depressed, Hopeless 1 1 0 0 0  PHQ - 2 Score 1 1 0 0 0    Relevant past medical, surgical, family and social history reviewed Past Medical History:  Diagnosis Date  . Anemia   . GERD (gastroesophageal reflux disease)   . Neuropathy   . Tobacco abuse 03/14/2017  . Vertigo   . White matter disease of brain due to ischemia 12/04/2017   Head CT March 2019   Past Surgical History:  Procedure Laterality Date  . none     Family History  Problem Relation Age of  Onset  . Diabetes Mother   . Hypertension Mother   . Emphysema Mother   . Cancer Father   . Diabetes Maternal Grandmother   . Hypertension Maternal Grandmother   . Blindness Maternal Grandmother   . Cancer Paternal Grandmother   . Cancer Paternal Grandfather   . Diabetes Brother   . Alcohol abuse Brother   . Cancer Maternal Grandfather        prostate  . Emphysema Maternal Grandfather    Social History   Tobacco Use  . Smoking status: Current Every Day Smoker    Packs/day: 0.50    Years: 25.00    Pack years: 12.50    Types: Cigarettes  . Smokeless tobacco: Never Used  Substance Use Topics  . Alcohol use: Yes    Alcohol/week: 8.4 oz    Types: 14 Cans of beer per week  . Drug use: No    Interim medical history since last visit reviewed. Allergies and medications  reviewed  Review of Systems Per HPI unless specifically indicated above     Objective:    BP 120/70   Pulse 86   Temp 98.4 F (36.9 C) (Oral)   Resp 16   Ht 5\' 6"  (1.676 m)   Wt 147 lb 3.2 oz (66.8 kg)   LMP 01/23/2015   SpO2 93%   BMI 23.76 kg/m   Wt Readings from Last 3 Encounters:  12/04/17 147 lb 3.2 oz (66.8 kg)  11/28/17 150 lb (68 kg)  11/25/17 150 lb (68 kg)    Physical Exam  Constitutional: She appears well-developed and well-nourished. No distress.  HENT:  Head: Normocephalic and atraumatic.  Nose: No nasal septal hematoma. No epistaxis.  Shallow area of dried blood and hyperemia without active bleeding on the RIGHT nasal septum; no perforation  Eyes: EOM are normal. No scleral icterus.  Neck: No thyromegaly present.  Cardiovascular: Normal rate, regular rhythm and normal heart sounds.  No murmur heard. Pulmonary/Chest: Effort normal and breath sounds normal. No respiratory distress. She has no wheezes.  Abdominal: Soft. Bowel sounds are normal. She exhibits no distension.  Musculoskeletal: She exhibits no edema.       Right hip: She exhibits decreased range of motion. She exhibits no deformity.  Pain with external rotation  Neurological: She is alert. She exhibits normal muscle tone.  Skin: Skin is warm and dry. She is not diaphoretic. No pallor.  Psychiatric: She has a normal mood and affect. Her behavior is normal. Judgment and thought content normal. Her mood appears not anxious. She does not exhibit a depressed mood.  Good eye contact, full range of affect, but did become briefly tearful when discussing her head CT findings   Results for orders placed or performed during the hospital encounter of 76/19/50  Basic metabolic panel  Result Value Ref Range   Sodium 132 (L) 135 - 145 mmol/L   Potassium 3.8 3.5 - 5.1 mmol/L   Chloride 97 (L) 101 - 111 mmol/L   CO2 24 22 - 32 mmol/L   Glucose, Bld 107 (H) 65 - 99 mg/dL   BUN 8 6 - 20 mg/dL   Creatinine, Ser  0.43 (L) 0.44 - 1.00 mg/dL   Calcium 9.1 8.9 - 10.3 mg/dL   GFR calc non Af Amer >60 >60 mL/min   GFR calc Af Amer >60 >60 mL/min   Anion gap 11 5 - 15  CBC with Differential  Result Value Ref Range   WBC 8.1 3.6 - 11.0 K/uL  RBC 4.51 3.80 - 5.20 MIL/uL   Hemoglobin 14.4 12.0 - 16.0 g/dL   HCT 42.2 35.0 - 47.0 %   MCV 93.6 80.0 - 100.0 fL   MCH 32.1 26.0 - 34.0 pg   MCHC 34.2 32.0 - 36.0 g/dL   RDW 13.7 11.5 - 14.5 %   Platelets 240 150 - 440 K/uL   Neutrophils Relative % 44 %   Lymphocytes Relative 50 %   Monocytes Relative 4 %   Eosinophils Relative 1 %   Basophils Relative 1 %   Neutro Abs 3.6 1.4 - 6.5 K/uL   Lymphs Abs 4.0 (H) 1.0 - 3.6 K/uL   Monocytes Absolute 0.3 0.2 - 0.9 K/uL   Eosinophils Absolute 0.1 0 - 0.7 K/uL   Basophils Absolute 0.1 0 - 0.1 K/uL   WBC Morphology ATYPICAL LYMPHOCYTES    Smear Review MORPHOLOGY UNREMARKABLE   Ethanol  Result Value Ref Range   Alcohol, Ethyl (B) 280 (H) <10 mg/dL  Protime-INR  Result Value Ref Range   Prothrombin Time 13.7 11.4 - 15.2 seconds   INR 1.06       Assessment & Plan:   Problem List Items Addressed This Visit      Cardiovascular and Mediastinum   Benign hypertension    Well-controlled today        Nervous and Auditory   White matter disease of brain due to ischemia    Due to smoking, alcohol, high cholesterol (which she has really brought down), HTN; BP excellent control today; encouraged healthy lifestyle habits        Other   Tobacco abuse    Encouraged her to quit      Dyslipidemia    Reviewed last two lipid panel and she has really brought down her numbers; so important because of the head CT findings      Alcohol use    See AVS; I am here for her and can refer to [facility] substance abuse counselor       Other Visit Diagnoses    Epistaxis    -  Primary   Relevant Orders   Ambulatory referral to ENT   Chronic hip pain, right       Relevant Orders   Ambulatory referral to Orthopedic  Surgery       Follow up plan: Return in about 8 weeks (around 01/29/2018) for twenty minute follow-up with fasting labs.  An after-visit summary was printed and given to the patient at [facility].  Please see the patient instructions which may contain other information and recommendations beyond what is mentioned above in the assessment and plan.  No orders of the defined types were placed in this encounter.   Orders Placed This Encounter  Procedures  . Ambulatory referral to ENT  . Ambulatory referral to Orthopedic Surgery

## 2017-12-04 NOTE — Assessment & Plan Note (Signed)
Well controlled today.

## 2017-12-04 NOTE — Assessment & Plan Note (Signed)
Encouraged her to quit

## 2017-12-09 ENCOUNTER — Ambulatory Visit: Admission: RE | Admit: 2017-12-09 | Payer: BLUE CROSS/BLUE SHIELD | Source: Ambulatory Visit | Admitting: *Deleted

## 2017-12-12 ENCOUNTER — Other Ambulatory Visit: Payer: Self-pay

## 2017-12-12 ENCOUNTER — Ambulatory Visit: Payer: BLUE CROSS/BLUE SHIELD | Attending: Nurse Practitioner | Admitting: Physical Therapy

## 2017-12-12 ENCOUNTER — Encounter: Payer: Self-pay | Admitting: Physical Therapy

## 2017-12-12 DIAGNOSIS — R42 Dizziness and giddiness: Secondary | ICD-10-CM | POA: Diagnosis not present

## 2017-12-12 NOTE — Therapy (Signed)
[place] MAIN [facility] SERVICES [address] [place], [place], [address] Phone: [phone]   Fax:  [phone]  Physical Therapy Evaluation  Patient Details  Name: Jenna Newton MRN: [id number] Date of Birth: May 17, 1966 Referring Provider: Suezanne Cheshire, NP   Encounter Date: 12/12/2017  PT End of Session - 12/12/17 1241    Visit Number  1    Number of Visits  9    Date for PT Re-Evaluation  02/06/18    PT Start Time  6222    PT Stop Time  1115    PT Time Calculation (min)  61 min    Equipment Utilized During Treatment  Gait belt    Activity Tolerance  Patient tolerated treatment well    Behavior During Therapy  [facility] for tasks assessed/performed       Past Medical History:  Diagnosis Date  . Anemia   . GERD (gastroesophageal reflux disease)   . Neuropathy   . Tobacco abuse 03/14/2017  . Vertigo   . White matter disease of brain due to ischemia 12/04/2017   Head CT March 2019    Past Surgical History:  Procedure Laterality Date  . none      There were no vitals filed for this visit.   Subjective Assessment - 12/12/17 1036    Subjective  Patient arrives to clinic reporting that she is not having any dizziness symptoms at present. Patient did report reproduction of her dizziness symptoms during evaluation. Patient states she needs to be able to return to work after the PT evaluation and was concerned about not doing anything that might bring on her dizziness symptoms so that she would not be able to return to work. Encouraged patient to let this writer know if activities during the evaluation were bringing on her symptoms and discussed that we would modify the evaluation as needed.     Pertinent History  Patient reports on 08/25/2016 she had an episode of dizziness where she fell and hit her posterior head requiring 6 stitches.  Patient reports that she has been getting spontaneous episodes of dizziness. 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 Patient states the walking while turning her head was increasing her dizziness so she now minimizes the amount of head turning and instead uses her peripheral vision to try to scan the room in order to try to decrease her symptoms. She also describes working on a machine where she has to look up and then bend down and states that this makes her dizzy at times as well. Patient states the episodes of dizziness happen during the day, but states when she gets home she does not have the dizziness. Patient reports that she has learned what brings on the dizziness so she tries to avoid those movements and slow down her movements. Patient describes oscillopsia symptoms as she reports she gets blurry vision when she gets dizzy sometimes. Patient reports vertigo, unsteadiness and feels like she gets slurred speech during episodes of dizziness at times as well. Patient reports she does not have a history of headaches or migraines. Patient states in the past she was able to go 3-4 months without dizziness, but now patient states she is getting daily episodes of dizziness and feels her symptoms have worsened. Patient states she has an ENT physician appointment next  week because she has been getting episodes of nosebleeds. Patient has not seen an ENT MD in regards in regards to her dizziness symptoms.     Diagnostic tests  CT head 11/28/17 revealed no acute intracranial pathology per MR.     Patient Stated Goals  to have reduced dizziness and improved balance at work; be able to do perform her job duties without modifications to reduce dizziness    Currently in Pain?  No/denies    Pain Onset  --         [facility] PT Assessment - 12/12/17 1236      Assessment   Medical Diagnosis  Benign paroxysmal positional vertigo due to bilateral vestibular disorder      Referring Provider  Suezanne Cheshire, NP    Onset Date/Surgical Date  08/25/16    Prior Therapy  no prior vestibular therapy      Precautions   Precautions  Fall      Restrictions   Weight Bearing Restrictions  No      Balance Screen   Has the patient fallen in the past 6 months  No    Has the patient had a decrease in activity level because of a fear of falling?   No    Is the patient reluctant to leave their home because of a fear of falling?   No      Home Environment   Living Environment  Private residence    Type of [place] Access  Level entry    [place]  None      Prior Function   Level of Independence  Independent with community mobility without device    Vocation  Full time employment    Vocation Requirements  patient works as a Conservation officer, historic buildings in [employer]   Overall Cognitive Status  Within Functional Limits for tasks assessed    Attention  Focused    Focused Attention  Appears intact      Ambulation/Gait   Gait Pattern  Step-to pattern    Ambulation Surface  Level;Indoor    Stairs  Yes    Stairs Assistance  7: Independent    Stair Management Technique  No rails    Number of Stairs  4      Standardized Balance Assessment   Standardized Balance Assessment  Dynamic Gait Index      Dynamic Gait Index   Level Surface  Normal    Change in Gait Speed  Normal    Gait with Horizontal Head Turns  Mild Impairment    Gait with Vertical Head Turns  Mild Impairment    Gait and Pivot Turn  Normal increased sway with right body turns & pt reports imbalance    Step Over Obstacle  Normal    Step Around Obstacles  Normal    Steps  Normal    Total Score  22      High Level Balance   High Level Balance Activites  Head turns         VESTIBULAR AND BALANCE EVALUATION   HISTORY:  Subjective history of current problem: Patient reports on 08/25/2016 she had an episode of dizziness where she fell and hit her  posterior head requiring 6 stitches.  Patient reports that she has been getting spontaneous episodes of dizziness. 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 Patient states the walking while turning her head was increasing her dizziness so she now minimizes the amount of head turning and instead uses her peripheral vision to try to scan the room in order to try to decrease her symptoms. She also describes working on a machine where she has to look up and then bend down and states that this makes her dizzy at times as well. Patient states the episodes of dizziness happen during the day, but states when she gets home she does not have the dizziness. Patient reports that she has learned what brings on the dizziness so she tries to avoid those movements and slow down her movements. Patient describes oscillopsia symptoms as she reports she gets blurry vision when she gets dizzy sometimes. Patient reports vertigo, unsteadiness and feels like she gets slurred speech during episodes of dizziness at times as well. Patient reports she does not have a history of headaches or migraines. Patient states in the past she was able to go 3-4 months without dizziness, but now patient states she is getting daily episodes of dizziness and feels her symptoms have worsened. Patient states she has an ENT physician appointment next week because she has been getting episodes of nosebleeds. Patient has not seen an ENT MD in regards in regards to her dizziness symptoms.    Description of dizziness: vertigo, unsteadiness, aural fullness- occasionally; Patient reports vertigo, unsteadiness, feels like she gets slurred speech during episodes. Patient reports she does not have a history of headaches or migraines.  Frequency: patient reports she has been getting episodes daily currently.  Patient reports that in the past she was able to go 3-4 months without dizziness Duration: an hour to an hour and a half Symptom nature: motion provoked, spontaneous, intermittent  Provocative Factors: moving head fast, closing eyes and opening them quickly Easing Factors: sitting still, relaxing  Progression of symptoms: worse History of similar episodes: none  Falls (yes/no): no Number of falls in past 6 months:  0  Auditory complaints (tinnitus, pain, drainage): denies Vision (last eye exam, diplopia, recent changes): patient reports she gets blurry vision when she gets dizzy sometimes. Denies other changes.  Current Symptoms:  Review of systems negative for red flags.     EXAMINATION  POSTURE:  Slumped sitting posture at times      COORDINATION: Finger to Nose:   Normal left and pass pointing noted with right finger to nose testing Past Pointing:    Right  Mild pass pointing; left normal Pronator Drift:         normal  MUSCULOSKELETAL SCREEN: Cervical Spine ROM: Patient reports her "neck feels a little stiff" this am. Cervical AROM rotation [facility] with patient reporting soreness right upper traps with right rotation and flexion. Cervical AROM flexion and extension WNL.  Gait: Patient arrives to clinic ambulating without AD. Patient ambulates with good cadence with step through gait pattern. Patient able to perform body turns but noted increased sway and patient reports increased dizziness with body turns to the right. Scanning of visual environment with gait is: patient scans with her eyes when she can to avoid turning her head  Balance: Patient demonstrates difficulty with uneven surfaces, narrow BOS, EC, body and head turning activities.  POSTURAL CONTROL TESTS:   Clinical Test of Sensory Interaction for Balance    (CTSIB): CONDITION TIME STRATEGY SWAY  Eyes open, firm  surface 30 seconds    Eyes closed, firm surface 30 seconds ankle +1  Eyes open, foam surface 30  seconds ankle +2  Eyes closed, foam surface 12 seconds Ankle, hip +4    OCULOMOTOR / VESTIBULAR TESTING:  Oculomotor Exam- Room Light  Normal Abnormal Comments  Ocular Alignment N    Ocular ROM N    Spontaneous Nystagmus N    End-Gaze Nystagmus N    Smooth Pursuit N    Saccades   Mild hypometric saccade with left field gaze noted  VOR  Abn 4/10 dizziness   VOR Cancellation N    Left Head Thrust N  Patient had limited tolerance to testing secondary to increased dizziness but no corrective saccades note with left head thrust testing  Right Head Thrust  potentially abnormal-to reassess next session Patient had limited tolerance to testing secondary to increased dizziness, but appeared to have two mild corrective saccades with right head thrust testing     BPPV TESTS:  Deferred will test next session secondary to patient needed to return to work after the PT evaluation this date and she was concerned about experiencing increased symptoms with vestibular testing and not being able to go back to work therefore deferred BPPV testing this session.   Symptoms Duration Intensity Nystagmus  L Dix-Hallpike      R Dix-Hallpike      L Head Roll      R Head Roll        FUNCTIONAL OUTCOME MEASURES:  Results Comments  DHI 16/100 Low perception of handicap  ABC Scale 74.3% Falls risk; in need of intervention  DGI 22/24 Safe for community mobility    Neuromuscular Re-education:  VOR X 1 exercise:  Demonstrated and educated as to VOR X1.  Patient performed VOR X 1 horizontal in sitting 2 reps of 30 seconds each with verbal cues for technique.  Patient reports the target is blurring at times and instructed to decrease head turning speed until target stays in focus. Patient reports 4/10 dizziness.  Issued for HEP.  Body Wall Rolls:  Patient performed 3 reps of supported, body wall rolls with eyes open. Patient noted to be leaning on wall for support. Patient reports 4/10 dizziness after 2  reps and 6/10 after 3 reps with this activity.   Patient performed standing with slow horizontal and vertical head turns for 30 second reps. Patient reports 4/10 dizziness with this activity.  PT Education - 12/12/17 1036    Education provided  Yes    Education Details  discussed plan of care and goals; issued HEP-VOR, body wall rolls and feet together with horizontal and vertical head turns    Person(s) Educated  Patient    Methods  Explanation;Demonstration;Handout;Verbal cues    Comprehension  Returned demonstration;Verbalized understanding       PT Short Term Goals - 12/12/17 1244      PT SHORT TERM GOAL #1   Title  Patient will be able to perform home program independently for self-management.    Time  4    Period  Weeks    Status  New    Target Date  01/09/18        PT Long Term Goals - 12/12/17 1244      PT LONG TERM GOAL #1   Title  Patient will have demonstrate decreased falls risk as indicated by Activities Specific Balance Confidence Scale score of 80% or greater.    Time  8    Period  Weeks    Status  New    Target Date  02/06/18      PT LONG TERM GOAL #2   Title  Patient will report 50% or greater improvement in her symptoms of dizziness and imbalance with provoking motions or positions at work and in her daily activities by 02/06/2018.    Time  8    Period  Weeks    Status  New    Target Date  02/06/18      PT LONG TERM GOAL #3   Title  Patient will be able to perform modified CTSIB component of standing on Airex pad with feet together with eyes closed for 30 seconds to demonstrate improvements in balance and vestibular function.    Baseline  12 seconds on 12/12/2017    Time  8    Period  Weeks    Status  New    Target Date  02/06/18               Plan - 12/12/17 1241    Clinical Impression Statement  Patient reports that she has been having intermittent episodes of dizziness and vertigo since 08/25/2016. Patient reports modifying her activities  in an effort to try to decrease her symptoms. Patient noted to have potential indicators of peripheral and central symptoms with oculomotor testing. Patient concerned about being able to return to work without increased dizziness symptoms so deferred BPPV testing this date but will plan to evaluate that first next session. Patient only able to hold feet together with eyes closed on foam surface for 12 seconds with CTSIB testing this date. Patient presents with listed deficits and would benefit from vestibular therapy to address subjective symptoms of vertigo and imbalance, to address goals as set on plan of care and to try to reduce patient's falls risk.       History and Personal Factors relevant to plan of care:  age, multiple comorbidities- HTN, white matter disease of brain due to ischemia, alcohol use    Clinical Presentation  Evolving    Clinical Decision Making  Moderate    Rehab Potential  Good    PT Frequency  1x / week    PT Duration  8 weeks    PT Treatment/Interventions  Vestibular;Balance training;Therapeutic exercise;Therapeutic activities;Gait training;Neuromuscular re-education;Patient/family education;Canalith Repostioning    PT Next Visit Plan  Perform canal testing to r/o BPPV, try doing head shaking nystagmus test if patient can tolerate, review HEP and add additional exercises, ambulation with head turns, ball followsAirex pad activities    PT Home Exercise Plan  VOR x 1 30 second reps in sitting, body wall rolls, feet together with horizontal and vertical head turns    Consulted and Agree with Plan of Care  Patient       Patient will benefit from skilled therapeutic intervention in order to improve the following deficits and impairments:  Decreased coordination, Dizziness, Decreased balance, Difficulty walking  Visit Diagnosis: Dizziness and giddiness     Problem List Patient Active Problem List   Diagnosis Date Noted  . White matter disease of brain due to ischemia  12/04/2017  . Alcohol use 12/04/2017  . Medication monitoring encounter 04/18/2017  . Benign hypertension 04/18/2017  . Dyslipidemia 04/18/2017  . Dry mouth 03/14/2017  . Neuropathy 03/14/2017  . Tobacco abuse 03/14/2017  . Mononeuritis 05/12/2014  . Numbness of lower extremity 05/12/2014  . Right foot drop 05/12/2014  . Weakness 05/12/2014   Lady Deutscher PT, DPT [phone] Lady Deutscher  12/12/2017, 1:55 PM  [place] MAIN [facility] SERVICES [address] [place], [place], [address] Phone: [phone]   Fax:  [phone]  Name: Railynn Ballo MRN: [id number] Date of Birth: Jul 24, 1966

## 2017-12-15 DIAGNOSIS — I10 Essential (primary) hypertension: Secondary | ICD-10-CM | POA: Diagnosis not present

## 2017-12-15 DIAGNOSIS — R040 Epistaxis: Secondary | ICD-10-CM | POA: Diagnosis not present

## 2017-12-16 ENCOUNTER — Ambulatory Visit: Payer: BLUE CROSS/BLUE SHIELD | Admitting: Physical Therapy

## 2017-12-16 ENCOUNTER — Encounter: Payer: Self-pay | Admitting: Physical Therapy

## 2017-12-16 DIAGNOSIS — R42 Dizziness and giddiness: Secondary | ICD-10-CM | POA: Diagnosis not present

## 2017-12-16 NOTE — Therapy (Signed)
[place] MAIN [facility] SERVICES [address] [place], [place], [address] Phone: [phone]   Fax:  [phone]  Physical Therapy Treatment  Patient Details  Name: Jenna Newton MRN: [id number] Date of Birth: 03-26-1966 Referring Provider: Suezanne Cheshire, NP   Encounter Date: 12/16/2017  PT End of Session - 12/16/17 1010    Visit Number  2    Number of Visits  9    Date for PT Re-Evaluation  02/06/18    PT Start Time  1010    PT Stop Time  1105    PT Time Calculation (min)  55 min    Equipment Utilized During Treatment  Gait belt    Activity Tolerance  Patient tolerated treatment well    Behavior During Therapy  [facility] for tasks assessed/performed       Past Medical History:  Diagnosis Date  . Anemia   . GERD (gastroesophageal reflux disease)   . Neuropathy   . Tobacco abuse 03/14/2017  . Vertigo   . White matter disease of brain due to ischemia 12/04/2017   Head CT March 2019    Past Surgical History:  Procedure Laterality Date  . none      There were no vitals filed for this visit.  Subjective Assessment - 12/16/17 1009    Subjective  Patient reports the standing exercises were harder to do. Patient reports the VOR exercise brought on mild dizziness symptoms. Patient reports that she saw the ENT yesterday in regards to her nose bleeds. Patient reports that the ENT gave her a cream for her nose to use three times a day. Patient states she did not discuss her dizziness symptoms at the ENT.     Pertinent History  Patient reports on 08/25/2016 she had an episode of dizziness where she fell and hit her posterior head requiring 6 stitches.  Patient reports that she has been getting spontaneous episodes of dizziness. 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 Patient states the  walking while turning her head was increasing her dizziness so she now minimizes the amount of head turning and instead uses her peripheral vision to try to scan the room in order to try to decrease her symptoms. She also describes working on a machine where she has to look up and then bend down and states that this makes her dizzy at times as well. Patient states the episodes of dizziness happen during the day, but states when she gets home she does not have the dizziness. Patient reports that she has learned what brings on the dizziness so she tries to avoid those movements and slow down her movements. Patient describes oscillopsia symptoms as she reports she gets blurry vision when she gets dizzy sometimes. Patient reports vertigo, unsteadiness and feels like she gets slurred speech during episodes of dizziness at times as well. Patient reports she does not have a history of headaches or migraines. Patient states in the past she was able to go 3-4 months without dizziness, but now patient states she is getting daily episodes of dizziness and feels her symptoms have worsened. Patient states she has an ENT physician appointment next week because she has been getting episodes of nosebleeds. Patient has not seen an ENT MD in regards in regards to her dizziness symptoms.     Diagnostic tests  CT head 11/28/17 revealed no acute intracranial pathology per MR.     Patient Stated Goals  to have reduced dizziness and improved balance at work; be able to do perform her job duties without modifications to reduce dizziness    Currently in Pain?  No/denies         Neuromuscular Re-education:  BPPV TESTS:  Symptoms Duration Intensity Nystagmus  L Dix-Hallpike none N/A N/A None observed  R Dix-Hallpike none N/A N/A None observed  L Head Roll none N/A N/A None observed  R Head Roll none N/A N/A None observed    VOR X 1 exercise:  Patient performed VOR X 1 horizontal in standing with plain background 1 minute rep  and then repeated 1 minute rep doing vertical VOR X 1 with verbal cues for technique as initially patient was doing head turning too quickly. Patient denied dizziness with this activity.  Patient performed horizontal VOR X 1 with conflicting background 2 reps of 1 minute each.  Patient reported 2/10 dizziness with this activity. Added conflicting background in standing to HEP.   Body Wall Rolls:  Patient performed 2 reps of supported body wall rolls with eyes open. Patient reported no increase in dizziness symptoms.  Patient performed 4 reps of unsupported, body wall rolls with eyes open.  Patient reports 9/10 dizziness with unsupported body wall rolls activity.   Hallway ball toss:  In hallway, worked on ball toss against one wall with alternating quick turns to toss ball against opposite wall while tracking with eyes and head. Patient denied increase in dizziness symptoms.  Airex pad:  On Airex pad, patient performed feet together progressions and semi-tandem progressions with alternating lead leg with and without body turns and horizontal and vertical head turns with CGA multiple 30-60 second reps.  Patient reports vague sensation of dizziness/imbalance but reports it is very mild and eases with rest break.   Ball tracking : Patient performed static sitting while holding ball while moving ball horizontally and then vertically while tracking ball with head and eyes with verbal cues to turn the head. Patient denies dizziness with this activity.   Walking while scanning for visual targets: Performed 170' trials of forwards and retro ambulation while scanning for visual targets in hallway with CGA with forward ambulation and min A with retro ambulation secondary to veering and loss of balance. Patient reports 9/10 dizziness at first with retro ambulation but reports it improved with practice as did her balance.   Patient reports feeling sleepy towards end of session and states that she normally  gets sleepy sensation after she has gotten dizziness symptoms. Patient required sitting rest break after retro ambulation while scanning for visual targets in hallway.      PT Education - 12/16/17 1010    Education provided  Yes    Education Details  reviewed HEP and added additional exercises 1,3,4 and 5    Person(s) Educated  Patient    Methods  Explanation    Comprehension  Verbalized understanding       PT Short Term Goals - 12/12/17 1244      PT SHORT TERM GOAL #1   Title  Patient will be able to perform home program independently for self-management.    Time  4    Period  Weeks    Status  New    Target Date  01/09/18        PT Long Term Goals - 12/12/17 1244      PT LONG  TERM GOAL #1   Title  Patient will have demonstrate decreased falls risk as indicated by Activities Specific Balance Confidence Scale score of 80% or greater.    Time  8    Period  Weeks    Status  New    Target Date  02/06/18      PT LONG TERM GOAL #2   Title  Patient will report 50% or greater improvement in her symptoms of dizziness and imbalance with provoking motions or positions at work and in her daily activities by 02/06/2018.    Time  8    Period  Weeks    Status  New    Target Date  02/06/18      PT LONG TERM GOAL #3   Title  Patient will be able to perform modified CTSIB component of standing on Airex pad with feet together with eyes closed for 30 seconds to demonstrate improvements in balance and vestibular function.    Baseline  12 seconds on 12/12/2017    Time  8    Period  Weeks    Status  New    Target Date  02/06/18      PT LONG TERM GOAL #4   Title  --    Time  --    Period  --    Status  --    Target Date  --            Plan - 12/16/17 1010    Clinical Impression Statement  Patient reports that she has been doing her home exercise program and that the standing exercises bring on her symptoms. Patient with negative BPPV testing this date with no nystagmus observed  and patient denied vertigo. Patient able to progress to VOR X 1 in standing with conflicting background and unsupported body wall rolls. In addition, added feet together and semi-tandem stance progressions with body turns and head turns-horizontal and vertical to home exercise program. Patient did well with hallway ball toss with no increase in dizziness symptoms, but ambulation while scanning for targets especially retro ambulation increased patient's symptoms and was challenging to her balance. Patient's balance challenged by activities on Airex pad and mild increase in dizziness symptoms. Patient would benefit from continued PT services to work on progressions of activities and to work on goals as set on plan of care.     Rehab Potential  Good    PT Frequency  1x / week    PT Duration  8 weeks    PT Treatment/Interventions  Vestibular;Balance training;Therapeutic exercise;Therapeutic activities;Gait training;Neuromuscular re-education;Patient/family education;Canalith Repostioning    PT Next Visit Plan  Perform canal testing to r/o BPPV, try doing head shaking nystagmus test if patient can tolerate, review HEP and add additional exercises, ambulation with head turns, ball followsAirex pad activities    PT Home Exercise Plan  VOR x 1 30 second reps in sitting, body wall rolls, feet together with horizontal and vertical head turns    Consulted and Agree with Plan of Care  Patient       Patient will benefit from skilled therapeutic intervention in order to improve the following deficits and impairments:  Decreased coordination, Dizziness, Decreased balance, Difficulty walking  Visit Diagnosis: Dizziness and giddiness     Problem List Patient Active Problem List   Diagnosis Date Noted  . White matter disease of brain due to ischemia 12/04/2017  . Alcohol use 12/04/2017  . Medication monitoring encounter 04/18/2017  . Benign hypertension 04/18/2017  .  Dyslipidemia 04/18/2017  . Dry mouth  03/14/2017  . Neuropathy 03/14/2017  . Tobacco abuse 03/14/2017  . Mononeuritis 05/12/2014  . Numbness of lower extremity 05/12/2014  . Right foot drop 05/12/2014  . Weakness 05/12/2014   Lady Deutscher PT, DPT [phone] Lady Deutscher 12/16/2017, 3:52 PM  [place] MAIN [facility] SERVICES [address] [place], [place], [address] Phone: [phone]   Fax:  [phone]  Name: Aletta Edmunds MRN: [id number] Date of Birth: 1966/03/09

## 2017-12-23 ENCOUNTER — Ambulatory Visit: Payer: BLUE CROSS/BLUE SHIELD | Admitting: Physical Therapy

## 2017-12-23 ENCOUNTER — Encounter: Payer: Self-pay | Admitting: Physical Therapy

## 2017-12-23 DIAGNOSIS — R42 Dizziness and giddiness: Secondary | ICD-10-CM | POA: Diagnosis not present

## 2017-12-23 NOTE — Therapy (Signed)
[place] MAIN [facility] SERVICES [address] [place], [place], [address] Phone: [phone]   Fax:  [phone]  Physical Therapy Treatment  Patient Details  Name: Jenna Newton MRN: [id number] Date of Birth: [birth year] Referring Provider: Suezanne Cheshire, NP   Encounter Date: 12/23/2017  PT End of Session - 12/23/17 1008    Visit Number  3    Number of Visits  9    Date for PT Re-Evaluation  02/06/18    PT Start Time  1010    PT Stop Time  1053    PT Time Calculation (min)  43 min    Equipment Utilized During Treatment  Gait belt    Activity Tolerance  Patient tolerated treatment well    Behavior During Therapy  [facility] for tasks assessed/performed       Past Medical History:  Diagnosis Date  . Anemia   . GERD (gastroesophageal reflux disease)   . Neuropathy   . Tobacco abuse 03/14/2017  . Vertigo   . White matter disease of brain due to ischemia 12/04/2017   Head CT March 2019    Past Surgical History:  Procedure Laterality Date  . none      There were no vitals filed for this visit.  Subjective Assessment - 12/23/17 1008    Subjective  Patient reports her symptoms are a little better. Patient reports her neck is feeling stiff on the left. Patient reports that the home exercises are not bringing on any dizziness now. Patient reports the only time she is getting dizziness episodes is at work states she does "not know if it is stress or what".     Pertinent History  Patient reports on 08/25/2016 she had an episode of dizziness where she fell and hit her posterior head requiring 6 stitches.  Patient reports that she has been getting spontaneous episodes of dizziness. 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 Patient states the walking while turning her head was increasing her  dizziness so she now minimizes the amount of head turning and instead uses her peripheral vision to try to scan the room in order to try to decrease her symptoms. She also describes working on a machine where she has to look up and then bend down and states that this makes her dizzy at times as well. Patient states the episodes of dizziness happen during the day, but states when she gets home she does not have the dizziness. Patient reports that she has learned what brings on the dizziness so she tries to avoid those movements and slow down her movements. Patient describes oscillopsia symptoms as she reports she gets blurry vision when she gets dizzy sometimes. Patient reports vertigo, unsteadiness and feels like she gets slurred speech during episodes of dizziness at times as well. Patient reports she does not have a history of headaches or migraines. Patient states in the past she was able to go 3-4 months without dizziness, but now patient states she is getting daily episodes of dizziness and feels her symptoms have worsened. Patient states she has an ENT physician appointment next week because she has been getting episodes of nosebleeds. Patient has not seen an ENT MD in regards in regards to her dizziness symptoms.     Diagnostic tests  CT head 11/28/17 revealed no acute intracranial  pathology per MR.     Patient Stated Goals  to have reduced dizziness and improved balance at work; be able to do perform her job duties without modifications to reduce dizziness    Currently in Pain?  No/denies        Patient reports that the home exercises are not bringing on any dizziness now. Patient reports the only time she is getting dizziness episodes is at work states she does "not know if it is stress or what".     Hallway ball toss:  In hallway, worked on ball toss against one wall with alternating quick turns to toss ball against opposite wall while tracking with eyes and head. Patient denied increase in  dizziness symptoms.  VOR X 1 exercise:  Patient performed VOR X 1 horizontal 1 rep of 1 minute and vertical 1 rep of 1 minute in standing with conflicting background with verbal cues for technique as initially patient performing too large a head movement.  Patient performed 28' trials times 4 reps of walking while doing horizontal VOR X 1 with conflicting background with SBA. Patient without veering with good cadence.  Patient denies dizziness with this activity.   Ball toss over shoulder: Patient performed 175' forward ambulation while tossing ball over one shoulder with return catch over opposite shoulder with CGA.  Patient performed multiple 175' trials of forward and retro ambulation while tossing ball over one shoulder with return catch over opposite shoulder varying the ball position to head, shoulder and waist level to promote head turning and tilting. Patient reports 0/10 dizziness with this activity. Patient able to perform with good speed and no veering noted.  Body Wall Rolls:  Patient performed 4 reps of supported, body wall rolls with eyes closed with CGA.  Patient reported no dizziness symptoms and no imbalance noted.   Bounce Passes: Patient performed ambulation 175' trial while doing alternating sides bounce passes to self with ball while tracking ball with eyes and head.  Patient denied dizziness or imbalance and noted no veering during activity with good pace.   Airex pad:  On Airex pad, patient performed feet together progressions and semi-tandem progressions with alternating lead leg with and without body turns and horizontal and vertical head turns with CGA multiple 30-60 second reps. Patient denied dizziness but imbalance noted and patient using ankle strategies and at times hip and reaching to recover balance.   Airex balance beam: On Airex balance beam, patient performed sidestepping left/right without head turns and then with horizontal and vertical head turns  multiple reps times 5' each.  Performed on Airex balance beam tandem walking 5' times multiple reps. Patient did well with tandem walking. Patient denied dizziness.     PT Education - 12/23/17 1008    Education provided  Yes    Education Details  educated as to exercise progressions for HEP, upper trap stretch and shoulder circles and shrugs    Person(s) Educated  Patient    Methods  Explanation;Demonstration;Verbal cues    Comprehension  Verbalized understanding;Returned demonstration       PT Short Term Goals - 12/23/17 1105      PT SHORT TERM GOAL #1   Title  Patient will be able to perform home program independently for self-management.    Time  4    Period  Weeks    Status  Achieved        PT Long Term Goals - 12/12/17 1244      PT LONG TERM GOAL #  1   Title  Patient will have demonstrate decreased falls risk as indicated by Activities Specific Balance Confidence Scale score of 80% or greater.    Time  8    Period  Weeks    Status  New    Target Date  02/06/18      PT LONG TERM GOAL #2   Title  Patient will report 50% or greater improvement in her symptoms of dizziness and imbalance with provoking motions or positions at work and in her daily activities by 02/06/2018.    Time  8    Period  Weeks    Status  New    Target Date  02/06/18      PT LONG TERM GOAL #3   Title  Patient will be able to perform modified CTSIB component of standing on Airex pad with feet together with eyes closed for 30 seconds to demonstrate improvements in balance and vestibular function.    Baseline  12 seconds on 12/12/2017    Time  8    Period  Weeks    Status  New    Target Date  02/06/18           Plan - 12/23/17 1008    Clinical Impression Statement  Patient reporting compliance with home exercise program and reporting that now the exercises are not bringing on any symptoms. 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 Patient did very well this session and was able to perform high level balance and vestibular exercises with patient reporting no dizziness throughout session and mild imbalance with some of the more challenging exercises. Will plan on doing card sorting and ball sorting activities next session to try to recreate similar motions and activities that patient would be doing at her work to see if that recreates her dizziness symptoms. Patient would benefit from continued PT services to work towards goals and to try to further reduce her dizziness and imbalance symptoms.      Rehab Potential  Good    PT Frequency  1x / week    PT Duration  8 weeks    PT Treatment/Interventions  Vestibular;Balance training;Therapeutic exercise;Therapeutic activities;Gait training;Neuromuscular re-education;Patient/family education;Canalith Repostioning    PT Next Visit Plan  Plan on doing retro ambulation scanning for targets in hallway, card sorting and ball sorting tasks next session as well as EC on Airex pad and EC body wall rolls unsupported.     PT Home Exercise Plan  VOR x 1 30 second reps in sitting, body wall rolls, feet together with horizontal and vertical head turns    Consulted and Agree with Plan of Care  Patient       Patient will benefit from skilled therapeutic intervention in order to improve the following deficits and impairments:  Decreased coordination, Dizziness, Decreased balance, Difficulty walking  Visit Diagnosis: Dizziness and giddiness     Problem List Patient Active Problem List   Diagnosis Date Noted  . White matter disease of brain due to ischemia 12/04/2017  . Alcohol use 12/04/2017  . Medication monitoring encounter 04/18/2017  . Benign hypertension 04/18/2017  . Dyslipidemia 04/18/2017  . Dry mouth 03/14/2017  . Neuropathy 03/14/2017  . Tobacco abuse 03/14/2017  . Mononeuritis 05/12/2014  . Numbness of lower  extremity 05/12/2014  . Right foot drop 05/12/2014  . Weakness 05/12/2014   Lady Deutscher PT,  DPT #[id number] Murphy,Dorriea 12/23/2017, 11:23 AM  [place] MAIN [facility] SERVICES [address] [place], [place], [address] Phone: [phone]   Fax:  [phone]  Name: Jenna Newton MRN: [id number] Date of Birth: [birth year]

## 2017-12-29 DIAGNOSIS — R040 Epistaxis: Secondary | ICD-10-CM | POA: Diagnosis not present

## 2017-12-29 DIAGNOSIS — I10 Essential (primary) hypertension: Secondary | ICD-10-CM | POA: Diagnosis not present

## 2017-12-30 ENCOUNTER — Encounter: Payer: Self-pay | Admitting: Physical Therapy

## 2017-12-30 ENCOUNTER — Ambulatory Visit: Payer: BLUE CROSS/BLUE SHIELD | Admitting: Physical Therapy

## 2017-12-30 DIAGNOSIS — R42 Dizziness and giddiness: Secondary | ICD-10-CM | POA: Diagnosis not present

## 2017-12-30 NOTE — Therapy (Addendum)
[place] MAIN [facility] SERVICES [address] [place], [place], [address] Phone: [phone]   Fax:  [phone]  Physical Therapy Treatment  Patient Details  Name: Jenna Newton MRN: [id number] Date of Birth: [birth year] Referring Provider: Suezanne Cheshire, NP   Encounter Date: 12/30/2017  PT End of Session - 12/30/17 1014    Visit Number  4    Number of Visits  9    Date for PT Re-Evaluation  02/06/18    PT Start Time  1011    PT Stop Time  1055    PT Time Calculation (min)  44 min    Equipment Utilized During Treatment  --    Activity Tolerance  Patient tolerated treatment well    Behavior During Therapy  [facility] for tasks assessed/performed       Past Medical History:  Diagnosis Date  . Anemia   . GERD (gastroesophageal reflux disease)   . Neuropathy   . Tobacco abuse 03/14/2017  . Vertigo   . White matter disease of brain due to ischemia 12/04/2017   Head CT March 2019    Past Surgical History:  Procedure Laterality Date  . none      There were no vitals filed for this visit.  Subjective Assessment - 12/30/17 1013    Subjective  Patient reports she did well this past week and that she has not been having any symptoms at work. Patient states she has not been having "those little spells" and states she has not had any slurring of her speech.   Pertinent History  Patient reports on 08/25/2016 she had an episode of dizziness where she fell and hit her posterior head requiring 6 stitches.  Patient reports that she has been getting spontaneous episodes of dizziness. 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 Patient states the walking while turning her head was increasing her dizziness so she now minimizes the amount of head turning and instead uses her peripheral vision to try to  scan the room in order to try to decrease her symptoms. She also describes working on a machine where she has to look up and then bend down and states that this makes her dizzy at times as well. Patient states the episodes of dizziness happen during the day, but states when she gets home she does not have the dizziness. Patient reports that she has learned what brings on the dizziness so she tries to avoid those movements and slow down her movements. Patient describes oscillopsia symptoms as she reports she gets blurry vision when she gets dizzy sometimes. Patient reports vertigo, unsteadiness and feels like she gets slurred speech during episodes of dizziness at times as well. Patient reports she does not have a history of headaches or migraines. Patient states in the past she was able to go 3-4 months without dizziness, but now patient states she is getting daily episodes of dizziness and feels her symptoms have worsened. Patient states she has an ENT physician appointment next week because she has been getting episodes of nosebleeds. Patient has not seen an ENT MD in regards in regards to her dizziness symptoms.     Diagnostic tests  CT head 11/28/17 revealed no acute intracranial pathology per MR.     Patient Stated Goals  to have reduced dizziness and improved balance  at work; be able to do perform her job duties without modifications to reduce dizziness    Currently in Pain?  No/denies        Neuromuscular Re-education:  Ambulation with head turns:  Patient performed greater than 500' ambulation while scanning for targets in busy medical mall as called out by therapist and then as randomly selected by patient with S.  Patient performed 175' trials of forwards and retro ambulation with diagonal head turns and then random head turns with S.   Patient demonstrates mild veering at times with retro ambulation with diagonal head turns but denies dizziness.  Card Sorting Activity:  Patient performed  deck of card sorting activity on mat table sorting by numbers and then turning to place cards on low chair to incorporate horizontal and vertical head turning and body turns. Patient was able to perform activity with good speed and sharp quick turns.  Patient reports no dizziness with this activity.  Ball Sorting Activity: On firm surface, performed transferring multicolored balls from bin placed on floor to another bin elevated on a table placed 180 degrees on the other side of patient while patient visually tracks the ball so that patient is required to turn 180 degrees Left and Right to turn to bend over and pick and place balls in the bins with CGA. Pt with no evidence of imbalance and denied dizziness.  Bounce Passes: Patient performed ambulation 58' trials while doing alternating sides bounce passes to self with ball while tracking ball with eyes and head.   Body Wall Rolls:  Patient performed 4 reps of unsupported, body wall rolls with eyes closed.  Patient denies dizziness with this activity.   Airex pad:  On Airex pad, patient performed feet together and semi-tandem progressions with alternating lead leg with and without body turns and horizontal and vertical head turns with eyes open and with eyes closed. Patient tended to lose her balance to the left side with eyes closed activity and eyes closed activities were challenging for her balance while standing on Airex pad; however, patient denied any dizziness.    Discussed safety precautions with performing HEP at home. Demonstrated and discussed standing in corner with chair in front for safety and then patient return demonstrated.  Note: patient denied dizziness or other symptoms during all activities this date.    PT Education - 12/30/17 1014    Education provided  Yes    Person(s) Educated  Patient    Methods  Explanation    Comprehension  Verbalized understanding       PT Short Term Goals - 12/23/17 1105      PT SHORT  TERM GOAL #1   Title  Patient will be able to perform home program independently for self-management.    Time  4    Period  Weeks    Status  Achieved        PT Long Term Goals - 12/12/17 1244      PT LONG TERM GOAL #1   Title  Patient will have demonstrate decreased falls risk as indicated by Activities Specific Balance Confidence Scale score of 80% or greater.    Time  8    Period  Weeks    Status  New    Target Date  02/06/18      PT LONG TERM GOAL #2   Title  Patient will report 50% or greater improvement in her symptoms of dizziness and imbalance with provoking motions or positions at work and  in her daily activities by 02/06/2018.    Time  8    Period  Weeks    Status  New    Target Date  02/06/18      PT LONG TERM GOAL #3   Title  Patient will be able to perform modified CTSIB component of standing on Airex pad with feet together with eyes closed for 30 seconds to demonstrate improvements in balance and vestibular function.    Baseline  12 seconds on 12/12/2017    Time  8    Period  Weeks    Status  New    Target Date  02/06/18      PT LONG TERM GOAL #4   Title  --    Time  --    Period  --    Status  --    Target Date  --            Plan - 01/02/18 1736    Clinical Impression Statement  Patient did extremely well this date and was able to do high level vestibular exercises without any dizziness symptoms. Patient's balance was challenged by eyse closed activities on uneven surface and issued these for home exercise program with patient standing in corner with a chair in front for safety at home while performing exercise program. Patient reporting that she did not have any episodes of dizziness, imbalance or slurring of her speech this past week not even when she was at work which is a significant improvement. Patient to continue performing HEP daily including progressions added this date and return to clinic next week. Will consider retesting functional outcome  measures and discharging from PT services if patient continues to report improvement in her symptoms of dizziness.     Rehab Potential  Good    PT Frequency  1x / week    PT Duration  8 weeks    PT Treatment/Interventions  Vestibular;Balance training;Therapeutic exercise;Therapeutic activities;Gait training;Neuromuscular re-education;Patient/family education;Canalith Repostioning    PT Next Visit Plan  repeating functional outcome measures if patient reports no dizziness episodes; review HEP    PT Home Exercise Plan  VOR x 1 30 second reps in sitting, body wall rolls, feet together with horizontal and vertical head turns    Consulted and Agree with Plan of Care  Patient       Patient will benefit from skilled therapeutic intervention in order to improve the following deficits and impairments:  Decreased coordination, Dizziness, Decreased balance, Difficulty walking  Visit Diagnosis: Dizziness and giddiness     Problem List Patient Active Problem List   Diagnosis Date Noted  . White matter disease of brain due to ischemia 12/04/2017  . Alcohol use 12/04/2017  . Medication monitoring encounter 04/18/2017  . Benign hypertension 04/18/2017  . Dyslipidemia 04/18/2017  . Dry mouth 03/14/2017  . Neuropathy 03/14/2017  . Tobacco abuse 03/14/2017  . Mononeuritis 05/12/2014  . Numbness of lower extremity 05/12/2014  . Right foot drop 05/12/2014  . Weakness 05/12/2014   Lady Deutscher PT, DPT [phone] Lady Deutscher 12/30/2017, 4:34 PM  [place] MAIN [facility] SERVICES [address] [place], [place], [address] Phone: [phone]   Fax:  [phone]  Name: Jenna Newton MRN: [id number] Date of Birth: 10-04-1965

## 2018-01-06 ENCOUNTER — Encounter: Payer: Self-pay | Admitting: Physical Therapy

## 2018-01-06 ENCOUNTER — Ambulatory Visit: Payer: BLUE CROSS/BLUE SHIELD | Attending: Nurse Practitioner | Admitting: Physical Therapy

## 2018-01-06 DIAGNOSIS — R42 Dizziness and giddiness: Secondary | ICD-10-CM | POA: Insufficient documentation

## 2018-01-06 NOTE — Therapy (Signed)
[place] MAIN [facility] SERVICES [address] [place], [place], [address] Phone: [phone]   Fax:  [phone]  Physical Therapy Treatment/ Discharge Summary  Patient Details  Name: Jenna Newton MRN: [id number] Date of Birth: 02/20/1966 Referring Provider: Suezanne Cheshire, NP   Encounter Date: 01/06/2018  PT End of Session - 01/06/18 1020    Visit Number  5    Number of Visits  9    Date for PT Re-Evaluation  02/06/18    PT Start Time  1016    Activity Tolerance  Patient tolerated treatment well    Behavior During Therapy  [facility] for tasks assessed/performed       Past Medical History:  Diagnosis Date  . Anemia   . GERD (gastroesophageal reflux disease)   . Neuropathy   . Tobacco abuse 03/14/2017  . Vertigo   . White matter disease of brain due to ischemia 12/04/2017   Head CT March 2019    Past Surgical History:  Procedure Laterality Date  . none      There were no vitals filed for this visit.  Subjective Assessment - 01/15/18 1356    Subjective  Patient reports she is doing good. Patient reports no problems with dizziness this past week. Patient states she was even able to look down at her phone while walking and then look up from the phone without having any dizziness. Patient states she has not been having any dizziness at work now.     Pertinent History  Patient reports on 08/25/2016 she had an episode of dizziness where she fell and hit her posterior head requiring 6 stitches.  Patient reports that she has been getting spontaneous episodes of dizziness. 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 Patient states the walking while turning her head was increasing her dizziness so she now minimizes the amount of head turning and instead uses her peripheral vision to try to scan the  room in order to try to decrease her symptoms. She also describes working on a machine where she has to look up and then bend down and states that this makes her dizzy at times as well. Patient states the episodes of dizziness happen during the day, but states when she gets home she does not have the dizziness. Patient reports that she has learned what brings on the dizziness so she tries to avoid those movements and slow down her movements. Patient describes oscillopsia symptoms as she reports she gets blurry vision when she gets dizzy sometimes. Patient reports vertigo, unsteadiness and feels like she gets slurred speech during episodes of dizziness at times as well. Patient reports she does not have a history of headaches or migraines. Patient states in the past she was able to go 3-4 months without dizziness, but now patient states she is getting daily episodes of dizziness and feels her symptoms have worsened. Patient states she has an ENT physician appointment next week because she has been getting episodes of nosebleeds. Patient has not seen an ENT MD in regards in regards to her dizziness symptoms.     Diagnostic tests  CT head 11/28/17 revealed no acute intracranial pathology per MR.     Patient Stated Goals  to have reduced dizziness and improved balance at work; be able to do perform her job duties without  modifications to reduce dizziness    Currently in Pain?  No/denies       Physical Performance: Performed DGI, ABC scale and modified CTSIB functional outcome measures. Discussed test results and compared to prior testing. Discussed progress towards goals. Discussed community based balance and exercise programs and home exercise program. Patient reporting 100% improvement in her symptoms overall and states she has been able to resume all of her normal work duties and activities at home without symptoms.  Patient plans to continue to stay active, work and perform her home exercise program upon  discharge.    FUNCTIONAL OUTCOME MEASURES:  Results Comments  Modified CTSIB >30 seconds with +1 sway Patient able to perform feet together EC on foam   ABC Scale 100% normal  DGI 24/24 normal     PT Education - 01/06/18 1018    Education provided  Yes    Education Details  discussed discharge plans and continuing to do HEP upon discharge    Person(s) Educated  Patient    Methods  Explanation    Comprehension  Verbalized understanding       PT Short Term Goals - 12/23/17 1105      PT SHORT TERM GOAL #1   Title  Patient will be able to perform home program independently for self-management.    Time  4    Period  Weeks    Status  Achieved        PT Long Term Goals - 01/15/18 1401      PT LONG TERM GOAL #1   Title  Patient will have demonstrate decreased falls risk as indicated by Activities Specific Balance Confidence Scale score of 80% or greater.    Baseline  Patient scored 100% on the ABC scale on 01/06/18    Time  8    Period  Weeks    Status  Achieved      PT LONG TERM GOAL #2   Title  Patient will report 50% or greater improvement in her symptoms of dizziness and imbalance with provoking motions or positions at work and in her daily activities by 02/06/2018.    Baseline  Patient reports 100% improvement in her symptoms overall.     Time  8    Period  Weeks    Status  Achieved      PT LONG TERM GOAL #3   Title  Patient will be able to perform modified CTSIB component of standing on Airex pad with feet together with eyes closed for 30 seconds to demonstrate improvements in balance and vestibular function.    Baseline  12 seconds on 12/12/2017; 30 seconds on 01/06/18    Time  8    Period  Weeks    Status  Achieved            Plan - 01/15/18 1359    Clinical Impression Statement  Patient has met 3/3 long term goals and 1/1 short term goals this date. Patient reporting 100% improvement in her symptoms overall and states she has been able to resume all of her  normal work duties and activities at home without symptoms. Patient reports compliance with HEP. Patient plans to continue to stay active, work and perform her home exercise program upon discharge. Patient in agreement with discharge from PT services at this time with all goals met.     Rehab Potential  Good    PT Frequency  1x / week    PT Duration  8 weeks  PT Treatment/Interventions  Vestibular;Balance training;Therapeutic exercise;Therapeutic activities;Gait training;Neuromuscular re-education;Patient/family education;Canalith Repostioning    PT Home Exercise Plan  VOR x 1 30 second reps in sitting, body wall rolls, feet together with horizontal and vertical head turns    Consulted and Agree with Plan of Care  Patient       Patient will benefit from skilled therapeutic intervention in order to improve the following deficits and impairments:  Decreased coordination, Dizziness, Decreased balance, Difficulty walking  Visit Diagnosis: Dizziness and giddiness     Problem List Patient Active Problem List   Diagnosis Date Noted  . White matter disease of brain due to ischemia 12/04/2017  . Alcohol use 12/04/2017  . Medication monitoring encounter 04/18/2017  . Benign hypertension 04/18/2017  . Dyslipidemia 04/18/2017  . Dry mouth 03/14/2017  . Neuropathy 03/14/2017  . Tobacco abuse 03/14/2017  . Mononeuritis 05/12/2014  . Numbness of lower extremity 05/12/2014  . Right foot drop 05/12/2014  . Weakness 05/12/2014   Lady Deutscher PT, DPT [phone] Lady Deutscher 01/06/2018, 10:20 AM  [place] MAIN [facility] SERVICES [address] [place], [place], [address] Phone: [phone]   Fax:  [phone]  Name: Naria Abbey MRN: [id number] Date of Birth: [birth year]

## 2018-01-13 ENCOUNTER — Encounter: Payer: 59 | Admitting: Physical Therapy

## 2018-01-20 ENCOUNTER — Encounter: Payer: 59 | Admitting: Physical Therapy

## 2018-04-09 ENCOUNTER — Encounter: Payer: Self-pay | Admitting: Family Medicine

## 2018-04-09 ENCOUNTER — Ambulatory Visit
Admission: RE | Admit: 2018-04-09 | Discharge: 2018-04-09 | Disposition: A | Discharge status: Home / Self Care | Payer: BLUE CROSS/BLUE SHIELD | Source: Ambulatory Visit | Attending: Family Medicine | Admitting: Family Medicine

## 2018-04-09 ENCOUNTER — Ambulatory Visit: Payer: BLUE CROSS/BLUE SHIELD | Admitting: Family Medicine

## 2018-04-09 VITALS — BP 140/80 | HR 90 | Temp 97.6°F | Resp 16 | Ht 66.0 in | Wt 145.3 lb

## 2018-04-09 DIAGNOSIS — I10 Essential (primary) hypertension: Secondary | ICD-10-CM

## 2018-04-09 DIAGNOSIS — R059 Cough, unspecified: Secondary | ICD-10-CM

## 2018-04-09 DIAGNOSIS — R05 Cough: Secondary | ICD-10-CM | POA: Diagnosis not present

## 2018-04-09 DIAGNOSIS — Z720 Tobacco use: Secondary | ICD-10-CM

## 2018-04-09 DIAGNOSIS — M545 Low back pain, unspecified: Secondary | ICD-10-CM

## 2018-04-09 DIAGNOSIS — R0989 Other specified symptoms and signs involving the circulatory and respiratory systems: Secondary | ICD-10-CM | POA: Insufficient documentation

## 2018-04-09 DIAGNOSIS — Z789 Other specified health status: Secondary | ICD-10-CM

## 2018-04-09 DIAGNOSIS — Z7289 Other problems related to lifestyle: Secondary | ICD-10-CM

## 2018-04-09 LAB — POCT URINALYSIS DIPSTICK
APPEARANCE: NORMAL
Bilirubin, UA: NEGATIVE
Blood, UA: NEGATIVE
Glucose, UA: NEGATIVE
KETONES UA: NEGATIVE
Leukocytes, UA: NEGATIVE
NITRITE UA: NEGATIVE
PH UA: 6 (ref 5.0–8.0)
Protein, UA: NEGATIVE
Spec Grav, UA: 1.01 (ref 1.010–1.025)
UROBILINOGEN UA: 0.2 U/dL

## 2018-04-09 MED ORDER — METHOCARBAMOL 500 MG PO TABS: 500.0000 mg | ORAL_TABLET | Freq: Four times a day (QID) | ORAL | 0 refills | Status: DC | PRN

## 2018-04-09 NOTE — Assessment & Plan Note (Signed)
She continues to smoke; will get CXR today, with the abnormal breath sounds; I am here to help her quit if/when ready

## 2018-04-09 NOTE — Assessment & Plan Note (Signed)
Discussed safe/recommended alcohol intake limit for females

## 2018-04-09 NOTE — Patient Instructions (Addendum)
Try to limit alcohol to no more than 7 per week Please have the xrays done across the street Okay to take tylenol per package directions Use the new medicine at the pharmacy if needed (no alcohol for six hours before or after each pill) Call if any changes or worsening Mucinex (generic version is fine) follow package directions

## 2018-04-09 NOTE — Progress Notes (Signed)
BP 140/80 (BP Location: Right Arm, Patient Position: Sitting, Cuff Size: Normal)   Pulse 90   Temp 97.6 F (36.4 C) (Oral)   Resp 16   Ht 5\' 6"  (1.676 m)   Wt 145 lb 4.8 oz (65.9 kg)   LMP 01/23/2015   SpO2 94%   BMI 23.45 kg/m    Subjective:    Patient ID: Jenna Newton, female    DOB: Mar 21, 1966, 52 y.o.   MRN: [id number]  HPI: Jenna Newton is a 52 y.o. female  Chief Complaint  Patient presents with  . Back Pain    lower back pain x 4 days. Aches comes and goes and radiates to left flank.    HPI Patient is here for an acute visit She c/o lower back pain for the last 4 days It radiates to the left flank Aching pain; like something pushing and pulling Intermittent Changes with bending forward; also with bending a little to the left Tried heat and that didn't help Quit taking BC, heard that can cause problems  Urine dip done here was negative, unremarkable No blood in the urine; no dysuria  No blood in the stool, no mucous in the stool; moving stools okay; last this morning; totally normal  She usually catches a summer cold; has been coughing some  Depression screen [facility] 2/9 04/09/2018 12/04/2017 11/25/2017 06/19/2017 06/19/2017  Decreased Interest 0 0 0 0 0  Down, Depressed, Hopeless 0 1 1 0 0  PHQ - 2 Score 0 1 1 0 0    Relevant past medical, surgical, family and social history reviewed Past Medical History:  Diagnosis Date  . Anemia   . GERD (gastroesophageal reflux disease)   . Neuropathy   . Tobacco abuse 03/14/2017  . Vertigo   . White matter disease of brain due to ischemia 12/04/2017   Head CT March 2019   Past Surgical History:  Procedure Laterality Date  . none     Family History  Problem Relation Age of Onset  . Diabetes Mother   . Hypertension Mother   . Emphysema Mother   . Cancer Father   . Diabetes Maternal Grandmother   . Hypertension Maternal Grandmother   . Blindness Maternal Grandmother   . Cancer Paternal Grandmother   . Cancer  Paternal Grandfather   . Diabetes Brother   . Alcohol abuse Brother   . Cancer Maternal Grandfather        prostate  . Emphysema Maternal Grandfather    Social History   Tobacco Use  . Smoking status: Current Every Day Smoker    Packs/day: 0.50    Years: 25.00    Pack years: 12.50    Types: Cigarettes  . Smokeless tobacco: Never Used  Substance Use Topics  . Alcohol use: Yes    Alcohol/week: 14.0 standard drinks    Types: 14 Cans of beer per week  . Drug use: No  MD note: about 3 a day, 15 drinks per week, five days a week    Office Visit from 04/09/2018 in [facility]  AUDIT-C Score  4     Interim medical history since last visit reviewed. Allergies and medications reviewed  Review of Systems Per HPI unless specifically indicated above     Objective:    BP 140/80 (BP Location: Right Arm, Patient Position: Sitting, Cuff Size: Normal)   Pulse 90   Temp 97.6 F (36.4 C) (Oral)   Resp 16   Ht 5\' 6"  (1.676 m)  Wt 145 lb 4.8 oz (65.9 kg)   LMP 01/23/2015   SpO2 94%   BMI 23.45 kg/m   Wt Readings from Last 3 Encounters:  04/09/18 145 lb 4.8 oz (65.9 kg)  12/04/17 147 lb 3.2 oz (66.8 kg)  11/28/17 150 lb (68 kg)    Physical Exam  Constitutional: She appears well-developed and well-nourished.  HENT:  Mouth/Throat: Mucous membranes are normal.  Eyes: EOM are normal. No scleral icterus.  Cardiovascular: Normal rate and regular rhythm.  Pulmonary/Chest: Effort normal. She has decreased breath sounds in the right lower field.      Abdominal: Soft. Normal appearance and bowel sounds are normal. There is no splenomegaly. There is no tenderness.  Musculoskeletal:       Thoracic back: She exhibits tenderness. She exhibits no bony tenderness, no deformity and no spasm.       Back:  Psychiatric: She has a normal mood and affect. Her behavior is normal.    Results for orders placed or performed in visit on 04/09/18  POCT Urinalysis Dipstick    Result Value Ref Range   Color, UA light yellow    Clarity, UA clear    Glucose, UA Negative Negative   Bilirubin, UA Negative    Ketones, UA Negative    Spec Grav, UA 1.010 1.010 - 1.025   Blood, UA Negative    pH, UA 6.0 5.0 - 8.0   Protein, UA Negative Negative   Urobilinogen, UA 0.2 0.2 or 1.0 E.U./dL   Nitrite, UA Negative    Leukocytes, UA Negative Negative   Appearance normal    Odor yes       Assessment & Plan:   Problem List Items Addressed This Visit      Cardiovascular and Mediastinum   Benign hypertension    Not quite to goal; encouraged her to watch her salt intake; discomfort may be playing a part        Other   Tobacco abuse    She continues to smoke; will get CXR today, with the abnormal breath sounds; I am here to help her quit if/when ready      Alcohol use    Discussed safe/recommended alcohol intake limit for females       Other Visit Diagnoses    Acute midline low back pain without sciatica    -  Primary   urine unremarkable; sounds musculoskeletal; no sign of shingles; let me know if anything changes; tyelnol and robaxin   Relevant Medications   methocarbamol (ROBAXIN) 500 MG tablet   Other Relevant Orders   POCT Urinalysis Dipstick (Completed)   Cough       order CXR   Relevant Orders   DG Chest 2 View   Abnormal lung sounds       explained to patient that I want to get a CXR; risk with her smoking   Relevant Orders   DG Chest 2 View       Follow up plan: No follow-ups on file.  An after-visit summary was printed and given to the patient at [place].  Please see the patient instructions which may contain other information and recommendations beyond what is mentioned above in the assessment and plan.  Meds ordered this encounter  Medications  . methocarbamol (ROBAXIN) 500 MG tablet    Sig: Take 1 tablet (500 mg total) by mouth every 6 (six) hours as needed for muscle spasms. No alcohol within six hours of this    Dispense:  20  tablet    Refill:  0    Orders Placed This Encounter  Procedures  . DG Chest 2 View  . POCT Urinalysis Dipstick

## 2018-04-09 NOTE — Assessment & Plan Note (Signed)
Not quite to goal; encouraged her to watch her salt intake; discomfort may be playing a part

## 2018-10-01 ENCOUNTER — Other Ambulatory Visit: Payer: Self-pay | Admitting: Family Medicine

## 2018-10-01 NOTE — Telephone Encounter (Signed)
Patient needs an appointment and labs She is requesting a cholesterol medicine that hasn't been prescribed since 2018 She is obviously noncompliant Needs labs and a visit please

## 2018-10-02 NOTE — Telephone Encounter (Signed)
Called [phone] left voice message asking pt to return call to schedule appt

## 2018-10-07 ENCOUNTER — Encounter: Payer: Self-pay | Admitting: Family Medicine

## 2018-10-07 ENCOUNTER — Ambulatory Visit: Payer: BLUE CROSS/BLUE SHIELD | Admitting: Family Medicine

## 2018-10-07 VITALS — BP 130/72 | HR 100 | Temp 98.1°F | Resp 14 | Ht 66.0 in | Wt 146.2 lb

## 2018-10-07 DIAGNOSIS — J320 Chronic maxillary sinusitis: Secondary | ICD-10-CM | POA: Diagnosis not present

## 2018-10-07 DIAGNOSIS — I10 Essential (primary) hypertension: Secondary | ICD-10-CM | POA: Diagnosis not present

## 2018-10-07 DIAGNOSIS — Z789 Other specified health status: Secondary | ICD-10-CM

## 2018-10-07 DIAGNOSIS — E785 Hyperlipidemia, unspecified: Secondary | ICD-10-CM

## 2018-10-07 DIAGNOSIS — J3489 Other specified disorders of nose and nasal sinuses: Secondary | ICD-10-CM

## 2018-10-07 DIAGNOSIS — Z5181 Encounter for therapeutic drug level monitoring: Secondary | ICD-10-CM | POA: Diagnosis not present

## 2018-10-07 DIAGNOSIS — Z7289 Other problems related to lifestyle: Secondary | ICD-10-CM

## 2018-10-07 DIAGNOSIS — H60392 Other infective otitis externa, left ear: Secondary | ICD-10-CM

## 2018-10-07 DIAGNOSIS — Z720 Tobacco use: Secondary | ICD-10-CM

## 2018-10-07 MED ORDER — AMLODIPINE BESYLATE 5 MG PO TABS: 5.0000 mg | ORAL_TABLET | Freq: Every day | ORAL | 3 refills | Status: DC

## 2018-10-07 MED ORDER — LOVASTATIN 20 MG PO TABS: 20.0000 mg | ORAL_TABLET | Freq: Every day | ORAL | 1 refills | Status: DC

## 2018-10-07 MED ORDER — METHOCARBAMOL 500 MG PO TABS: 500.0000 mg | ORAL_TABLET | Freq: Four times a day (QID) | ORAL | 0 refills | Status: DC | PRN

## 2018-10-07 MED ORDER — VITAMIN B-12 500 MCG PO TABS: 500.0000 ug | ORAL_TABLET | Freq: Every day | ORAL | 3 refills | Status: DC

## 2018-10-07 MED ORDER — PROBIOTIC ACIDOPHILUS PO TABS: 1.0000 | ORAL_TABLET | Freq: Every day | ORAL | 0 refills | Status: DC

## 2018-10-07 MED ORDER — DOXYCYCLINE HYCLATE 100 MG PO TABS: 100.0000 mg | ORAL_TABLET | Freq: Two times a day (BID) | ORAL | 0 refills | Status: DC

## 2018-10-07 NOTE — Patient Instructions (Addendum)
We'll get a scan of your sinuses Start the antibiotic  Please do eat yogurt or kimchi or take a probiotic daily for the next month We want to replace the healthy germs in the gut If you notice foul, watery diarrhea in the next two months, schedule an appointment RIGHT AWAY or go to an urgent care or the emergency room if a holiday or over a weekend  Try to get down to less than 7 drinks a week  I do encourage you to quit smoking Call [phone] to sign up for smoking cessation classes You can call 1-800-QUIT-NOW to talk with a smoking cessation coach  Try to limit saturated fats in your diet (bologna, hot dogs, barbeque, cheeseburgers, hamburgers, steak, bacon, sausage, cheese, etc.) and get more fresh fruits, vegetables, and whole grains    Health Risks of Smoking Smoking cigarettes is very bad for your health. Tobacco smoke has over 200 known poisons in it. It contains the poisonous gases nitrogen oxide and carbon monoxide. There are over 60 chemicals in tobacco smoke that cause cancer. Smoking is difficult to quit because a chemical in tobacco, called nicotine, causes addiction or dependence. When you smoke and inhale, nicotine is absorbed rapidly into the bloodstream through your lungs. Both inhaled and non-inhaled nicotine may be addictive. What are the risks of cigarette smoke? Cigarette smokers have an increased risk of many serious medical problems, including:  Lung cancer.  Lung disease, such as pneumonia, bronchitis, and emphysema.  Chest pain (angina) and heart attack because the heart is not getting enough oxygen.  Heart disease and peripheral blood vessel disease.  High blood pressure (hypertension).  Stroke.  Oral cancer, including cancer of the lip, mouth, or voice box.  Bladder cancer.  Pancreatic cancer.  Cervical cancer.  Pregnancy complications, including premature birth.  Stillbirths and smaller newborn babies, birth defects, and genetic damage to  sperm.  Early menopause.  Lower estrogen level for women.  Infertility.  Facial wrinkles.  Blindness.  Increased risk of broken bones (fractures).  Senile dementia.  Stomach ulcers and internal bleeding.  Delayed wound healing and increased risk of complications during surgery.  Even smoking lightly shortens your life expectancy by several years. Because of secondhand smoke exposure, children of smokers have an increased risk of the following:  Sudden infant death syndrome (SIDS).  Respiratory infections.  Lung cancer.  Heart disease.  Ear infections. What are the benefits of quitting? There are many health benefits of quitting smoking. Here are some of them:  Within days of quitting smoking, your risk of having a heart attack decreases, your blood flow improves, and your lung capacity improves. Blood pressure, pulse rate, and breathing patterns start returning to normal soon after quitting.  Within months, your lungs may clear up completely.  Quitting for 10 years reduces your risk of developing lung cancer and heart disease to almost that of a nonsmoker.  People who quit may see an improvement in their overall quality of life. How do I quit smoking?     Smoking is an addiction with both physical and psychological effects, and longtime habits can be hard to change. Your health care provider can recommend:  Programs and community resources, which may include group support, education, or talk therapy.  Prescription medicines to help reduce cravings.  Nicotine replacement products, such as patches, gum, and nasal sprays. Use these products only as directed. Do not replace cigarette smoking with electronic cigarettes, which are commonly called e-cigarettes. The safety of e-cigarettes is  not known, and some may contain harmful chemicals.  A combination of two or more of these methods. Where to find more information  American Lung Association:  www.lung.org  American Cancer Society: www.cancer.org Summary  Smoking cigarettes is very bad for your health. Cigarette smokers have an increased risk of many serious medical problems, including several cancers, heart disease, and stroke.  Smoking is an addiction with both physical and psychological effects, and longtime habits can be hard to change.  By stopping right away, you can greatly reduce the risk of medical problems for you and your family.  To help you quit smoking, your health care provider can recommend programs, community resources, prescription medicines, and nicotine replacement products such as patches, gum, and nasal sprays. This information is not intended to replace advice given to you by your health care provider. Make sure you discuss any questions you have with your health care provider. Document Released: 09/26/2004 Document Revised: 11/20/2017 Document Reviewed: 08/23/2016 Elsevier Interactive Patient Education  2019 [employer].

## 2018-10-07 NOTE — Progress Notes (Signed)
BP 130/72   Pulse 100   Temp 98.1 F (36.7 C) (Oral)   Resp 14   Ht 5\' 6"  (1.676 m)   Wt 146 lb 3.2 oz (66.3 kg)   LMP 01/23/2015   SpO2 95%   BMI 23.60 kg/m    Subjective:    Patient ID: Jenna Newton, female    DOB: 04/14/1966, 53 y.o.   MRN: [id number]  HPI: Jenna Newton is a 53 y.o. female  Chief Complaint  Patient presents with  . Follow-up  . Medication Refill  . Ear Pain    left   . Dizziness    needs refill on med    HPI Patient is here f/u  Still smoking; not quite ready to quit smoking; I am here to help when needed; she thanked me for bringing it up and caring  Dizziness and left ear pain; has to wear ear plugs at work; sore in the left ear for 2 weeks; used some drops last night, but burning; no drainage; little bit of dizziness; no osore throat; no fevers, thing in the nose is bleeding, went from the right side to left side now; blowing and getting snotty blood, "like something is wrong with my sinuses"; she did inspection with camera but no CT scan  Having muscle spasms in the same spot, lower left side back; feels like a pinching and pulling; no blood in the stool; no blood in the urine; bending forward causes discomfort  High cholesterol; no problems with chol medicine Not much sausage or bacon or all  HTN; content with the medicine; no leg swelling; no constipation  Weight stable  Depression screen [facility] 2/9 10/07/2018 04/09/2018 12/04/2017 11/25/2017 06/19/2017  Decreased Interest 0 0 0 0 0  Down, Depressed, Hopeless 0 0 1 1 0  PHQ - 2 Score 0 0 1 1 0  Altered sleeping 0 - - - -  Tired, decreased energy 0 - - - -  Change in appetite 0 - - - -  Feeling bad or failure about yourself  0 - - - -  Trouble concentrating 0 - - - -  Moving slowly or fidgety/restless 0 - - - -  Suicidal thoughts 0 - - - -  PHQ-9 Score 0 - - - -   Fall Risk  10/07/2018 04/09/2018 12/04/2017 11/25/2017 06/19/2017  Falls in the past year? 0 No No No No  Number falls in past yr:  0 - - - -  Injury with Fall? 0 - - - -  Comment - - - - -    Relevant past medical, surgical, family and social history reviewed Past Medical History:  Diagnosis Date  . Anemia   . GERD (gastroesophageal reflux disease)   . Neuropathy   . Tobacco abuse 03/14/2017  . Vertigo   . White matter disease of brain due to ischemia 12/04/2017   Head CT March 2019   Past Surgical History:  Procedure Laterality Date  . none     Family History  Problem Relation Age of Onset  . Diabetes Mother   . Hypertension Mother   . Emphysema Mother   . Cancer Father   . Diabetes Maternal Grandmother   . Hypertension Maternal Grandmother   . Blindness Maternal Grandmother   . Cancer Paternal Grandmother   . Cancer Paternal Grandfather   . Diabetes Brother   . Alcohol abuse Brother   . Cancer Maternal Grandfather        prostate  .  Emphysema Maternal Grandfather    Social History   Tobacco Use  . Smoking status: Current Every Day Smoker    Packs/day: 0.50    Years: 25.00    Pack years: 12.50    Types: Cigarettes  . Smokeless tobacco: Never Used  Substance Use Topics  . Alcohol use: Yes    Alcohol/week: 14.0 standard drinks    Types: 14 Cans of beer per week  . Drug use: No  MD note: less than 14 drinks per week, she is getting that down on her own    [facility] Visit from 10/07/2018 in [facility]  AUDIT-C Score  3      Interim medical history since last visit reviewed. Allergies and medications reviewed  Review of Systems Per HPI unless specifically indicated above     Objective:    BP 130/72   Pulse 100   Temp 98.1 F (36.7 C) (Oral)   Resp 14   Ht 5\' 6"  (1.676 m)   Wt 146 lb 3.2 oz (66.3 kg)   LMP 01/23/2015   SpO2 95%   BMI 23.60 kg/m   Wt Readings from Last 3 Encounters:  10/07/18 146 lb 3.2 oz (66.3 kg)  04/09/18 145 lb 4.8 oz (65.9 kg)  12/04/17 147 lb 3.2 oz (66.8 kg)    Physical Exam Constitutional:      General: She is not in acute  distress.    Appearance: She is well-developed. She is not diaphoretic.  HENT:     Head: Normocephalic and atraumatic.     Right Ear: Tympanic membrane and ear canal normal.     Left Ear: Tympanic membrane normal. Swelling (along inferior aspect near opening, small erythematous papule with head suggestive of infection, no active oozing) and tenderness present.     Ears:      Nose: Mucosal edema and rhinorrhea (cloudy to yellow on the left) present.  Eyes:     General: No scleral icterus. Neck:     Thyroid: No thyromegaly.  Cardiovascular:     Rate and Rhythm: Normal rate and regular rhythm.     Heart sounds: Normal heart sounds. No murmur.  Pulmonary:     Effort: Pulmonary effort is normal. No respiratory distress.     Breath sounds: Normal breath sounds. No wheezing.  Abdominal:     General: Bowel sounds are normal. There is no distension.     Palpations: Abdomen is soft.  Lymphadenopathy:     Cervical: No cervical adenopathy.  Skin:    General: Skin is warm and dry.     Coloration: Skin is not pale.  Neurological:     Mental Status: She is alert.  Psychiatric:        Behavior: Behavior normal.        Thought Content: Thought content normal.        Judgment: Judgment normal.        Assessment & Plan:   Problem List Items Addressed This Visit      Cardiovascular and Mediastinum   Benign hypertension - Primary   Relevant Medications   amLODipine (NORVASC) 5 MG tablet   lovastatin (MEVACOR) 20 MG tablet     Other   Medication monitoring encounter   Relevant Orders   COMPLETE METABOLIC PANEL WITH GFR   Dyslipidemia   Relevant Medications   lovastatin (MEVACOR) 20 MG tablet   Other Relevant Orders   Lipid panel   Tobacco abuse    She is not ready to  quit smoking, but thanked me for caring and she knows I am here to help if/when she is ready to try to quit      Alcohol use    Encouragement given to cut down, no more than 7 drinks per week for women advised        Other Visit Diagnoses    Other specified disorders of nose and nasal sinuses       patient declined referral back to ENT, so I will get sinus CT to r/o mass, obstruction, etc.   Relevant Orders   CT Maxillofacial WO CM   Chronic maxillary sinusitis       patient reports PCN allergy; she declined referral to ENT; ABX with C diff precautions; call if needed   Relevant Medications   doxycycline (VIBRA-TABS) 100 MG tablet   Other Relevant Orders   CBC with Differential/Platelet   Infection of left ear canal       offered referral to ENT to have this lanced; she declined and opted for oral ABX only; C diff precautions reviewed; call if needed, refer to ENT if not improvin       Follow up plan: Return in about 6 months (around 04/07/2019) for follow-up visit with Dr. Sanda Klein.  An after-visit summary was printed and given to the patient at [place].  Please see the patient instructions which may contain other information and recommendations beyond what is mentioned above in the assessment and plan.  Meds ordered this encounter  Medications  . amLODipine (NORVASC) 5 MG tablet    Sig: Take 1 tablet (5 mg total) by mouth daily.    Dispense:  90 tablet    Refill:  3  . lovastatin (MEVACOR) 20 MG tablet    Sig: Take 1 tablet (20 mg total) by mouth at bedtime.    Dispense:  90 tablet    Refill:  1  . methocarbamol (ROBAXIN) 500 MG tablet    Sig: Take 1 tablet (500 mg total) by mouth every 6 (six) hours as needed for muscle spasms. No alcohol within six hours of this    Dispense:  20 tablet    Refill:  0  . vitamin B-12 (CYANOCOBALAMIN) 500 MCG tablet    Sig: Take 1 tablet (500 mcg total) by mouth daily.    Dispense:  100 tablet    Refill:  3    She'll use a Benef[employer] card; okay to change to #30 plus 11 or #60 plus 6 or any other way you have it in stock; thank you  . doxycycline (VIBRA-TABS) 100 MG tablet    Sig: Take 1 tablet (100 mg total) by mouth 2 (two) times daily.    Dispense:  14  tablet    Refill:  0  . Lactobacillus (PROBIOTIC ACIDOPHILUS) TABS    Sig: Take 1 tablet by mouth daily. To help prevent diarrhea with antibiotics    Dispense:  30 tablet    Refill:  0    Orders Placed This Encounter  Procedures  . CT Maxillofacial WO CM  . CBC with Differential/Platelet  . COMPLETE METABOLIC PANEL WITH GFR  . Lipid panel

## 2018-10-07 NOTE — Assessment & Plan Note (Signed)
She is not ready to quit smoking, but thanked me for caring and she knows I am here to help if/when she is ready to try to quit

## 2018-10-07 NOTE — Assessment & Plan Note (Signed)
Encouragement given to cut down, no more than 7 drinks per week for women advised

## 2018-10-08 ENCOUNTER — Other Ambulatory Visit: Payer: Self-pay | Admitting: Family Medicine

## 2018-10-08 DIAGNOSIS — E785 Hyperlipidemia, unspecified: Secondary | ICD-10-CM

## 2018-10-08 DIAGNOSIS — Z5181 Encounter for therapeutic drug level monitoring: Secondary | ICD-10-CM

## 2018-10-08 LAB — LIPID PANEL
CHOLESTEROL: 254 mg/dL — AB (ref <200)
HDL: 43 mg/dL — AB (ref >=50)
LDL Cholesterol (Calc): 167 mg/dL (calc) — ABNORMAL HIGH
Non-HDL Cholesterol (Calc): 211 mg/dL (calc) — ABNORMAL HIGH (ref <130)
TRIGLYCERIDES: 266 mg/dL — AB (ref <150)
Total CHOL/HDL Ratio: 5.9 (calc) — ABNORMAL HIGH (ref <5.0)

## 2018-10-08 LAB — COMPLETE METABOLIC PANEL WITH GFR
AG RATIO: 1.3 (calc) (ref 1.0–2.5)
ALKALINE PHOSPHATASE (APISO): 88 U/L (ref 37–153)
ALT: 19 U/L (ref 6–29)
AST: 18 U/L (ref 10–35)
Albumin: 4.3 g/dL (ref 3.6–5.1)
BILIRUBIN TOTAL: 0.3 mg/dL (ref 0.2–1.2)
BUN: 9 mg/dL (ref 7–25)
CALCIUM: 9.9 mg/dL (ref 8.6–10.4)
CHLORIDE: 104 mmol/L (ref 98–110)
CO2: 28 mmol/L (ref 20–32)
Creat: 0.51 mg/dL (ref 0.50–1.05)
GFR, Est African American: 127 mL/min/{1.73_m2} (ref >=60)
GFR, Est Non African American: 110 mL/min/{1.73_m2} (ref >=60)
Globulin: 3.2 g/dL (calc) (ref 1.9–3.7)
Glucose, Bld: 96 mg/dL (ref 65–99)
Potassium: 4.1 mmol/L (ref 3.5–5.3)
Sodium: 143 mmol/L (ref 135–146)
Total Protein: 7.5 g/dL (ref 6.1–8.1)

## 2018-10-08 LAB — CBC WITH DIFFERENTIAL/PLATELET
Absolute Monocytes: 433 cells/uL (ref 200–950)
BASOS PCT: 0.7 %
Basophils Absolute: 43 cells/uL (ref 0–200)
EOS ABS: 49 {cells}/uL (ref 15–500)
Eosinophils Relative: 0.8 %
HCT: 42.8 % (ref 35.0–45.0)
Hemoglobin: 14.8 g/dL (ref 11.7–15.5)
Lymphs Abs: 2605 cells/uL (ref 850–3900)
MCH: 32.5 pg (ref 27.0–33.0)
MCHC: 34.6 g/dL (ref 32.0–36.0)
MCV: 94.1 fL (ref 80.0–100.0)
MONOS PCT: 7.1 %
MPV: 11.1 fL (ref 7.5–12.5)
NEUTROS PCT: 48.7 %
Neutro Abs: 2971 cells/uL (ref 1500–7800)
PLATELETS: 251 10*3/uL (ref 140–400)
RBC: 4.55 10*6/uL (ref 3.80–5.10)
RDW: 13.1 % (ref 11.0–15.0)
TOTAL LYMPHOCYTE: 42.7 %
WBC: 6.1 10*3/uL (ref 3.8–10.8)

## 2018-10-08 MED ORDER — ATORVASTATIN CALCIUM 40 MG PO TABS: 40.0000 mg | ORAL_TABLET | Freq: Every day | ORAL | 0 refills | Status: DC

## 2018-10-14 ENCOUNTER — Ambulatory Visit
Admission: RE | Admit: 2018-10-14 | Discharge: 2018-10-14 | Disposition: A | Discharge status: Home / Self Care | Payer: BLUE CROSS/BLUE SHIELD | Source: Ambulatory Visit | Attending: Family Medicine | Admitting: Family Medicine

## 2018-10-14 DIAGNOSIS — J3489 Other specified disorders of nose and nasal sinuses: Secondary | ICD-10-CM | POA: Diagnosis not present

## 2018-10-14 NOTE — Progress Notes (Signed)
Jenna Newton, please let the patient know that her CT scan did not show any worrisome tumors or masses or inflammation. If she continues to have symptoms in the next week or two, please let us know and we'll have her go back to see her [place] doctor.

## 2018-12-16 ENCOUNTER — Telehealth: Payer: Self-pay | Admitting: Family Medicine

## 2018-12-16 NOTE — Telephone Encounter (Signed)
Copied from [place] [id number]. Topic: Quick Communication - See Telephone Encounter >> Dec 16, 2018  3:50 PM Ivar Drape wrote: CRM for notification. See Telephone encounter for: 12/16/18. Patient would like to know if something can be called in for her Vertigo, and sent to her preferred pharmacy [facility] in [place].

## 2018-12-17 NOTE — Telephone Encounter (Signed)
Pt scheduled for 12/18/18 at 3:20 with Lada.

## 2018-12-17 NOTE — Telephone Encounter (Signed)
She'll need a visit please She is allergic to meclizine so we'll need to have someone do a telehealth or MyChart or e-visit with her Remind people that if my schedule is booked, they can do e-visits

## 2018-12-18 ENCOUNTER — Other Ambulatory Visit: Payer: Self-pay

## 2018-12-18 ENCOUNTER — Encounter: Payer: Self-pay | Admitting: Nurse Practitioner

## 2018-12-18 ENCOUNTER — Ambulatory Visit (INDEPENDENT_AMBULATORY_CARE_PROVIDER_SITE_OTHER): Payer: BLUE CROSS/BLUE SHIELD | Admitting: Nurse Practitioner

## 2018-12-18 DIAGNOSIS — R42 Dizziness and giddiness: Secondary | ICD-10-CM

## 2018-12-18 DIAGNOSIS — M62830 Muscle spasm of back: Secondary | ICD-10-CM | POA: Diagnosis not present

## 2018-12-18 MED ORDER — METHOCARBAMOL 500 MG PO TABS: 500.0000 mg | ORAL_TABLET | Freq: Two times a day (BID) | ORAL | 0 refills | Status: DC

## 2018-12-18 MED ORDER — MECLIZINE HCL 25 MG PO TABS: 25.0000 mg | ORAL_TABLET | Freq: Three times a day (TID) | ORAL | 1 refills | Status: DC | PRN

## 2018-12-18 NOTE — Progress Notes (Signed)
Virtual Visit via Telephone Note  I connected with Jenna Newton on 12/18/18 at  1:20 PM EDT by a video enabled telemedicine application and verified that I am speaking with the correct person using two identifiers.   Staff discussed the limitations of evaluation and management by telemedicine and the availability of in person appointments. The patient expressed understanding and agreed to proceed.  Patient location: home  My location: home office Other people present:  None  HPI  Patient endorses vertigo has been acting up. States is started about three days ago. Episodes can last 5-10 minutes. States takes awhile to get back to her baseline after that- states just fatigued. States has seen ENT and did PT states helped temporarily but it gradually came back. States when she turns her head very quickly it causes events and feels just like her vertigo.  States she took antivert in the past but meclizine gave her headaches-explained same medication but one is branded. Would like to stick to brand.   Denies headaches blurry vision, slurred speech, unilateral weakness, paresthesia or nausea.    Patient endorses left sided back pain and muscle spasms, ongoing for a month or so. States has to lift at work and worsens spasms. Denies specific injuries. Robaxin works well for her pain.   PHQ2/9: Depression screen [facility] 2/9 12/18/2018 10/07/2018 04/09/2018 12/04/2017 11/25/2017  Decreased Interest 0 0 0 0 0  Down, Depressed, Hopeless 0 0 0 1 1  PHQ - 2 Score 0 0 0 1 1  Altered sleeping - 0 - - -  Tired, decreased energy - 0 - - -  Change in appetite - 0 - - -  Feeling bad or failure about yourself  - 0 - - -  Trouble concentrating - 0 - - -  Moving slowly or fidgety/restless - 0 - - -  Suicidal thoughts - 0 - - -  PHQ-9 Score - 0 - - -    PHQ reviewed. Negative  Patient Active Problem List   Diagnosis Date Noted  . White matter disease of brain due to ischemia 12/04/2017  . Alcohol use 12/04/2017   . Medication monitoring encounter 04/18/2017  . Benign hypertension 04/18/2017  . Dyslipidemia 04/18/2017  . Dry mouth 03/14/2017  . Neuropathy 03/14/2017  . Tobacco abuse 03/14/2017  . Mononeuritis 05/12/2014  . Numbness of lower extremity 05/12/2014  . Right foot drop 05/12/2014  . Weakness 05/12/2014    Past Medical History:  Diagnosis Date  . Anemia   . GERD (gastroesophageal reflux disease)   . Neuropathy   . Tobacco abuse 03/14/2017  . Vertigo   . White matter disease of brain due to ischemia 12/04/2017   Head CT March 2019    Past Surgical History:  Procedure Laterality Date  . none      Social History   Tobacco Use  . Smoking status: Current Every Day Smoker    Packs/day: 0.50    Years: 25.00    Pack years: 12.50    Types: Cigarettes  . Smokeless tobacco: Never Used  Substance Use Topics  . Alcohol use: Yes    Alcohol/week: 14.0 standard drinks    Types: 14 Cans of beer per week     Current Outpatient Medications:  .  amLODipine (NORVASC) 5 MG tablet, Take 1 tablet (5 mg total) by mouth daily., Disp: 90 tablet, Rfl: 3 .  atorvastatin (LIPITOR) 40 MG tablet, Take 1 tablet (40 mg total) by mouth at bedtime. For cholesterol; this replaces  lovastastin, Disp: 90 tablet, Rfl: 0 .  carbamide peroxide (DEBROX) 6.5 % OTIC solution, Place 5 drops into the right ear 2 (two) times daily., Disp: 15 mL, Rfl: 0 .  Lactobacillus (PROBIOTIC ACIDOPHILUS) TABS, Take 1 tablet by mouth daily. To help prevent diarrhea with antibiotics, Disp: 30 tablet, Rfl: 0 .  methocarbamol (ROBAXIN) 500 MG tablet, Take 1 tablet (500 mg total) by mouth every 6 (six) hours as needed for muscle spasms. No alcohol within six hours of this, Disp: 20 tablet, Rfl: 0 .  vitamin B-12 (CYANOCOBALAMIN) 500 MCG tablet, Take 1 tablet (500 mcg total) by mouth daily., Disp: 100 tablet, Rfl: 3  Allergies  Allergen Reactions  . Penicillins Other (See Comments)    swelling  . Meclizine Other (See Comments)     Gave pt headaches     ROS   No other specific complaints in a complete review of systems (except as listed in HPI above).  Objective  There were no vitals filed for this visit.   There is no height or weight on file to calculate BMI.  Nursing Note and Vital Signs reviewed.  Physical Exam  Alert, able to speak in full sentences   Assessment & Plan  1. Vertigo Discussed meclizine and antivert are essentially the same will start with half tab and take whole tab as needed. Discussed epley maneuver, declines PT at this time. Discussed red flag symptoms and when to go to ER.  - meclizine (ANTIVERT) 25 MG tablet; Take 1 tablet (25 mg total) by mouth 3 (three) times daily as needed for dizziness.  Dispense: 30 tablet; Refill: 1  2. Muscle spasm of back Discussed importance of stretching and hydration, muscle relaxer is not a long-term fix - methocarbamol (ROBAXIN) 500 MG tablet; Take 1 tablet (500 mg total) by mouth every 12 (twelve) hours. No alcohol within six hours of this  Dispense: 20 tablet; Refill: 0    Follow Up Instructions:    I discussed the assessment and treatment plan with the patient. The patient was provided an opportunity to ask questions and all were answered. The patient agreed with the plan and demonstrated an understanding of the instructions.   The patient was advised to call back or seek an in-person evaluation if the symptoms worsen or if the condition fails to improve as anticipated.  I provided 11 minutes of non-face-to-face time during this encounter.   Fredderick Severance, NP

## 2019-03-12 ENCOUNTER — Encounter: Payer: Self-pay | Admitting: Family Medicine

## 2019-03-12 ENCOUNTER — Ambulatory Visit: Payer: 59 | Admitting: Family Medicine

## 2019-03-12 ENCOUNTER — Other Ambulatory Visit: Payer: Self-pay

## 2019-03-12 VITALS — BP 122/64 | HR 106 | Temp 97.3°F | Resp 14 | Ht 66.0 in | Wt 153.2 lb

## 2019-03-12 DIAGNOSIS — M62830 Muscle spasm of back: Secondary | ICD-10-CM | POA: Diagnosis not present

## 2019-03-12 DIAGNOSIS — R040 Epistaxis: Secondary | ICD-10-CM | POA: Diagnosis not present

## 2019-03-12 DIAGNOSIS — E785 Hyperlipidemia, unspecified: Secondary | ICD-10-CM

## 2019-03-12 DIAGNOSIS — I10 Essential (primary) hypertension: Secondary | ICD-10-CM

## 2019-03-12 MED ORDER — OXYMETAZOLINE HCL 0.05 % NA SOLN: NASAL | 0 refills | Status: DC

## 2019-03-12 MED ORDER — METHOCARBAMOL 500 MG PO TABS: 500.0000 mg | ORAL_TABLET | Freq: Two times a day (BID) | ORAL | 0 refills | Status: DC

## 2019-03-12 MED ORDER — ATORVASTATIN CALCIUM 40 MG PO TABS: 40.0000 mg | ORAL_TABLET | Freq: Every day | ORAL | 0 refills | Status: DC

## 2019-03-12 MED ORDER — AYR SALINE NASAL NA GEL: 1.0000 "application " | NASAL | 3 refills | Status: DC

## 2019-03-12 NOTE — Patient Instructions (Signed)
Nosebleed, Adult A nosebleed is when blood comes out of the nose. Nosebleeds are common. Usually, they are not a sign of a serious condition. Nosebleeds can happen if a small blood vessel in your nose starts to bleed or if the lining of your nose (mucous membrane) cracks. They are commonly caused by:  Allergies.  Colds.  Picking your nose.  Blowing your nose too hard.  An injury from sticking an object into your nose or getting hit in the nose.  Dry or cold air. Less common causes of nosebleeds include:  Toxic fumes.  Something abnormal in the nose or in the air-filled spaces in the bones of the face (sinuses).  Growths in the nose, such as polyps.  Medicines or conditions that cause blood to clot slowly.  Certain illnesses or procedures that irritate or dry out the nasal passages. Follow these instructions at home: When you have a nosebleed:   Sit down and tilt your head slightly forward.  Use a clean towel or tissue to pinch your nostrils under the bony part of your nose. After 10 minutes, let go of your nose and see if bleeding starts again. Do not release pressure before that time. If there is still bleeding, repeat the pinching and holding for 10 minutes until the bleeding stops.  Do not place tissues or gauze in the nose to stop bleeding.  Avoid lying down and avoid tilting your head backward. That may make blood collect in the throat and cause gagging or coughing.  Use a nasal spray decongestant to help with a nosebleed as told by your health care provider.  Do not use petroleum jelly or mineral oil in your nose. It can drip into your lungs. After a nosebleed:  Avoid blowing your nose or sniffing for a number of hours.  Avoid straining, lifting, or bending at the waist for several days. You may resume other normal activities as you are able.  Use saline spray or a humidifier as told by your health care provider.  Aspirinand blood thinners make bleeding more  likely. If you are prescribed these medicines and you suffer from nosebleeds: ? Ask your health care provider if you should stop taking the medicines or if you should adjust the dose. ? Do not stop taking medicines that your health care provider has recommended unless told by your health care provider.  If your nosebleed was caused by dry mucous membranes, use over-the-counter saline nasal spray or gel. This will keep the mucous membranes moist and allow them to heal. If you must use a lubricant: ? Choose one that is water-soluble. ? Use only as much as you need and use it only as often as needed. ? Do not lie down until several hours after you use it. Contact a health care provider if:  You have a fever.  You get nosebleeds often or more often than usual.  You bruise very easily.  You have a nosebleed from having something stuck in your nose.  You have bleeding in your mouth.  You vomit or cough up brown material.  You have a nosebleed after you start a new medicine. Get help right away if:  You have a nosebleed after a fall or a head injury.  Your nosebleed does not go away after 20 minutes.  You feel dizzy or weak.  You have unusual bleeding from other parts of your body.  You have unusual bruising on other parts of your body.  You become sweaty.  You  vomit blood. This information is not intended to replace advice given to you by your health care provider. Make sure you discuss any questions you have with your health care provider. Document Released: 05/29/2005 Document Revised: 11/18/2017 Document Reviewed: 03/05/2016 Elsevier Patient Education  2020 [employer].

## 2019-03-12 NOTE — Progress Notes (Signed)
Name: Jenna Newton   MRN: [id number]    DOB: 06/30/1966   Date:03/12/2019       Progress Note  Subjective  Chief Complaint  Chief Complaint  Patient presents with  . Nose Problem    nose bleed on going has already seen ENT  . Medication Refill    HPI  - Pt presents with concern for ongoing nosebleeds.  She went to see ENT but was told there was nothing wrong.  She has used Ayr gel which did not help.  She tried Afrin but was told it could be addictive.  She denies headaches or sinus congestion.  Having epistaxis 1-3 times a week still.  BP  Is well controlled, no on Aspirin.   - Patient endorses left sided back pain and muscle spasms, ongoing for several months intermittently. States has to lift at work and worsens spasms. Denies specific injuries. Robaxin works well for her pain - we will provide refill today.  HTN: No chest pain, shortness of breath, headaches, vision changes.  TAking her medications as prescribed, BP is at goal.  HLD: Has not had labs done because she stopped her statin, restarted a few weeks ago.  No chest pain or shortness of breath, no myalgias.   Patient Active Problem List   Diagnosis Date Noted  . White matter disease of brain due to ischemia 12/04/2017  . Alcohol use 12/04/2017  . Medication monitoring encounter 04/18/2017  . Benign hypertension 04/18/2017  . Dyslipidemia 04/18/2017  . Dry mouth 03/14/2017  . Neuropathy 03/14/2017  . Tobacco abuse 03/14/2017  . Mononeuritis 05/12/2014  . Numbness of lower extremity 05/12/2014  . Right foot drop 05/12/2014  . Weakness 05/12/2014    Social History   Tobacco Use  . Smoking status: Current Every Day Smoker    Packs/day: 0.50    Years: 25.00    Pack years: 12.50    Types: Cigarettes  . Smokeless tobacco: Never Used  Substance Use Topics  . Alcohol use: Yes    Alcohol/week: 14.0 standard drinks    Types: 14 Cans of beer per week     Current Outpatient Medications:  .  amLODipine  (NORVASC) 5 MG tablet, Take 1 tablet (5 mg total) by mouth daily., Disp: 90 tablet, Rfl: 3 .  atorvastatin (LIPITOR) 40 MG tablet, Take 1 tablet (40 mg total) by mouth at bedtime. For cholesterol; this replaces lovastastin, Disp: 90 tablet, Rfl: 0 .  carbamide peroxide (DEBROX) 6.5 % OTIC solution, Place 5 drops into the right ear 2 (two) times daily., Disp: 15 mL, Rfl: 0 .  vitamin B-12 (CYANOCOBALAMIN) 500 MCG tablet, Take 1 tablet (500 mcg total) by mouth daily., Disp: 100 tablet, Rfl: 3 .  Lactobacillus (PROBIOTIC ACIDOPHILUS) TABS, Take 1 tablet by mouth daily. To help prevent diarrhea with antibiotics (Patient not taking: Reported on 03/12/2019), Disp: 30 tablet, Rfl: 0 .  meclizine (ANTIVERT) 25 MG tablet, Take 1 tablet (25 mg total) by mouth 3 (three) times daily as needed for dizziness. (Patient not taking: Reported on 03/12/2019), Disp: 30 tablet, Rfl: 1 .  methocarbamol (ROBAXIN) 500 MG tablet, Take 1 tablet (500 mg total) by mouth every 12 (twelve) hours. No alcohol within six hours of this (Patient not taking: Reported on 03/12/2019), Disp: 20 tablet, Rfl: 0  Allergies  Allergen Reactions  . Penicillins Other (See Comments)    swelling  . Meclizine Other (See Comments)    Gave pt headaches     I personally reviewed  active problem list, medication list, allergies, notes from last encounter, lab results with the patient/caregiver today.  ROS  Constitutional: Negative for fever or weight change.  Respiratory: Negative for cough and shortness of breath.   Cardiovascular: Negative for chest pain or palpitations.  Gastrointestinal: Negative for abdominal pain, no bowel changes.  Musculoskeletal: Negative for gait problem or joint swelling.  Skin: Negative for rash.  Neurological: Negative for dizziness or headache.  No other specific complaints in a complete review of systems (except as listed in HPI above).  Objective  Vitals:   03/12/19 1204  BP: 122/64  Pulse: (!) 106  Resp:  14  Temp: (!) 97.3 F (36.3 C)  SpO2: 99%  Weight: 153 lb 3.2 oz (69.5 kg)  Height: 5\' 6"  (1.676 m)   Body mass index is 24.73 kg/m.  Nursing Note and Vital Signs reviewed.  Physical Exam  Constitutional: Patient appears well-developed and well-nourished. No distress.  HENT: Head: Normocephalic and atraumatic. Ears: bilateral TMs with no erythema or effusion; Nose: Nose There is no active bleeding, the right lateral internal nostril has some scabbing present, no other abnormalities of the nostrils.  bilaterally. Mouth/Throat: Oropharynx is clear and moist. No oropharyngeal exudate or tonsillar swelling.  Eyes: Conjunctivae and EOM are normal. No scleral icterus.  Neck: Normal range of motion. Neck supple. No JVD present. No thyromegaly present.  Cardiovascular: Normal rate, regular rhythm and normal heart sounds.  No murmur heard. No BLE edema. Pulmonary/Chest: Effort normal and breath sounds normal. No respiratory distress. Musculoskeletal: Normal range of motion, no joint effusions. No gross deformities Neurological: Pt is alert and oriented to person, place, and time. No cranial nerve deficit. Coordination, balance, strength, speech and gait are normal.  Skin: Skin is warm and dry. No rash noted. No erythema.  Psychiatric: Patient has a normal mood and affect. behavior is normal. Judgment and thought content normal.  No results found for this or any previous visit (from the past 72 hour(s)).  Assessment & Plan  1. Recurrent epistaxis - saline (AYR) GEL; Place 1 application into the nose every 4 (four) hours.  Dispense: 14.1 g; Refill: 3 - oxymetazoline (AFRIN) 0.05 % nasal spray; Place 2 sprays in nostril ONLY WHEN BLEEDING.  Dispense: 30 mL; Refill: 0 - STOP smoking indoors; advised smoking cessation but she is not ready.  2. Muscle spasm of back - methocarbamol (ROBAXIN) 500 MG tablet; Take 1 tablet (500 mg total) by mouth every 12 (twelve) hours. No alcohol within six hours  of this  Dispense: 20 tablet; Refill: 0  3. Dyslipidemia - atorvastatin (LIPITOR) 40 MG tablet; Take 1 tablet (40 mg total) by mouth at bedtime. For cholesterol; this replaces lovastastin  Dispense: 90 tablet; Refill: 0  4. Benign hypertension - Continue medications as prescribed; at goal today.   -Red flags and when to present for emergency care or RTC including fever >101.19F, chest pain, shortness of breath, new/worsening/un-resolving symptoms, reviewed with patient at time of visit. Follow up and care instructions discussed and provided in AVS.

## 2019-06-11 ENCOUNTER — Other Ambulatory Visit: Payer: Self-pay | Admitting: Emergency Medicine

## 2019-06-11 DIAGNOSIS — E785 Hyperlipidemia, unspecified: Secondary | ICD-10-CM

## 2019-06-11 MED ORDER — ATORVASTATIN CALCIUM 40 MG PO TABS: 40.0000 mg | ORAL_TABLET | Freq: Every day | ORAL | 0 refills | Status: DC

## 2019-08-22 ENCOUNTER — Other Ambulatory Visit: Payer: Self-pay | Admitting: Family Medicine

## 2019-08-22 DIAGNOSIS — I10 Essential (primary) hypertension: Secondary | ICD-10-CM

## 2019-09-09 ENCOUNTER — Other Ambulatory Visit: Payer: Self-pay | Admitting: Emergency Medicine

## 2019-09-09 DIAGNOSIS — E785 Hyperlipidemia, unspecified: Secondary | ICD-10-CM

## 2019-09-13 MED ORDER — ATORVASTATIN CALCIUM 40 MG PO TABS: 40.0000 mg | ORAL_TABLET | Freq: Every day | ORAL | 0 refills | Status: DC

## 2019-09-14 NOTE — Telephone Encounter (Signed)
lvm informing pt that presciption has been sent to pharmacy and for her to return call to schedule appt with [facility]

## 2019-10-14 ENCOUNTER — Ambulatory Visit: Payer: Self-pay | Admitting: *Deleted

## 2019-10-14 NOTE — Telephone Encounter (Signed)
This is the third attempt the [facility] has made to return call to this pt.  I have forwarded to [facility] for further disposition.

## 2019-10-14 NOTE — Telephone Encounter (Signed)
Left vm she needs appt its been 6 months since last seen

## 2019-10-14 NOTE — Telephone Encounter (Signed)
Patient calling with complaints of vertigo. She states she has a history of this but cannot remember name of medication prescribed. She is requesting call back from RN.

## 2019-10-20 ENCOUNTER — Other Ambulatory Visit: Payer: Self-pay | Admitting: Emergency Medicine

## 2019-10-20 DIAGNOSIS — E785 Hyperlipidemia, unspecified: Secondary | ICD-10-CM

## 2019-11-10 ENCOUNTER — Ambulatory Visit: Payer: 59 | Admitting: Family Medicine

## 2019-11-24 ENCOUNTER — Telehealth: Payer: Self-pay | Admitting: Family Medicine

## 2019-11-24 NOTE — Telephone Encounter (Signed)
Patient is needing a medication refill on her cholesterol medicine she didn't know the name of it . Please advise for a refill

## 2019-11-30 ENCOUNTER — Encounter: Payer: Self-pay | Admitting: Emergency Medicine

## 2019-11-30 ENCOUNTER — Other Ambulatory Visit: Payer: Self-pay

## 2019-11-30 ENCOUNTER — Emergency Department
Admission: EM | Admit: 2019-11-30 | Discharge: 2019-11-30 | Disposition: A | Discharge status: Home / Self Care | Payer: 59 | Attending: Student in an Organized Health Care Education/Training Program | Admitting: Student in an Organized Health Care Education/Training Program

## 2019-11-30 DIAGNOSIS — F17210 Nicotine dependence, cigarettes, uncomplicated: Secondary | ICD-10-CM | POA: Insufficient documentation

## 2019-11-30 DIAGNOSIS — I10 Essential (primary) hypertension: Secondary | ICD-10-CM | POA: Diagnosis not present

## 2019-11-30 DIAGNOSIS — Z20822 Contact with and (suspected) exposure to covid-19: Secondary | ICD-10-CM | POA: Diagnosis not present

## 2019-11-30 DIAGNOSIS — Z79899 Other long term (current) drug therapy: Secondary | ICD-10-CM | POA: Diagnosis not present

## 2019-11-30 DIAGNOSIS — R519 Headache, unspecified: Secondary | ICD-10-CM

## 2019-11-30 LAB — SARS CORONAVIRUS 2 (TAT 6-24 HRS): SARS Coronavirus 2: NEGATIVE

## 2019-11-30 MED ORDER — ACETAMINOPHEN 500 MG PO TABS
1000.0000 mg | ORAL_TABLET | Freq: Once | ORAL | Status: AC
  Administered 2019-11-30: 1000 mg via ORAL
  Filled 2019-11-30: qty 2

## 2019-11-30 MED ORDER — PROCHLORPERAZINE MALEATE 10 MG PO TABS
10.0000 mg | ORAL_TABLET | Freq: Once | ORAL | Status: AC
  Administered 2019-11-30: 10 mg via ORAL
  Filled 2019-11-30: qty 1

## 2019-11-30 NOTE — ED Triage Notes (Signed)
Patient ambulatory to triage with steady gait, without difficulty or distress noted, mask in place; pt reports since last night having frontal HA accomp by N/V

## 2019-11-30 NOTE — Discharge Instructions (Signed)
Your covid test should result in MyChart in 6-24 hrs.  As discussed in the emergency department, you may use Tylenol and/or Ibuprofen for headaches. These are "Over the Counter" medications and can be found at most drug stores and grocery stores. Please use the recommended dosing instructions on the bottle/box. Do not exceed the maximum dose for either medications. Please be sure to rest and drink plenty of fluids. Please be sure to call your PCP for a follow-up visit, especially if your headaches persist.  Please call your physician or return to ED if you have: 1. Worsening or change in headaches. 2. Changes in vision. 3. New-onset nausea and vomiting. 4. Numbness, tingling, weakness in your extremities,. 4. Inability to eat or drink adequate amounts of food or liquids. 5. Chest pain, shortness of breath, or difficulty breathing. 6. Neurological changes- dizziness, fainting, loss of function of your arms, legs or other parts of your body. 7. Uncontrolled hypertension. 8. Or any other emergent concerns.

## 2019-11-30 NOTE — ED Provider Notes (Signed)
[facility] Emergency Department Provider Note    First MD Initiated Contact with Patient 11/30/19 [phone]     (approximate)  I have reviewed the triage vital signs and the nursing notes.   HISTORY  Chief Complaint Headache    HPI Jenna Newton is a 54 y.o. female who presents to the ER for evaluation of 24 hours of frontal headache.  She has not had this type headache in the past that she can recall.  Denies any fevers.  Is not had any congestion.  No cough or chest pain.  No neck stiffness.  No blurry vision.  Denies any trauma.  States it was not sudden onset.  There is no thunderclap headache.  No new medications.  Did not recently stop smoking or caffeine.  States the pain is mild.    Past Medical History:  Diagnosis Date  . Anemia   . GERD (gastroesophageal reflux disease)   . Neuropathy   . Tobacco abuse 03/14/2017  . Vertigo   . White matter disease of brain due to ischemia 12/04/2017   Head CT March 2019   Family History  Problem Relation Age of Onset  . Diabetes Mother   . Hypertension Mother   . Emphysema Mother   . Cancer Father   . Diabetes Maternal Grandmother   . Hypertension Maternal Grandmother   . Blindness Maternal Grandmother   . Cancer Paternal Grandmother   . Cancer Paternal Grandfather   . Diabetes Brother   . Alcohol abuse Brother   . Cancer Maternal Grandfather        prostate  . Emphysema Maternal Grandfather    Past Surgical History:  Procedure Laterality Date  . none     Patient Active Problem List   Diagnosis Date Noted  . White matter disease of brain due to ischemia 12/04/2017  . Alcohol use 12/04/2017  . Medication monitoring encounter 04/18/2017  . Benign hypertension 04/18/2017  . Dyslipidemia 04/18/2017  . Dry mouth 03/14/2017  . Neuropathy 03/14/2017  . Tobacco abuse 03/14/2017  . Mononeuritis 05/12/2014  . Numbness of lower extremity 05/12/2014  . Right foot drop 05/12/2014  . Weakness 05/12/2014       Prior to Admission medications   Medication Sig Start Date End Date Taking? Authorizing Provider  amLODipine (NORVASC) 5 MG tablet Take 1 tablet (5 mg total) by mouth daily. 08/23/19   Hubbard Hartshorn, FNP  atorvastatin (LIPITOR) 40 MG tablet Take 1 tablet (40 mg total) by mouth at bedtime. For cholesterol; this replaces lovastastin 09/13/19   Delsa Grana, PA-C  carbamide peroxide (DEBROX) 6.5 % OTIC solution Place 5 drops into the right ear 2 (two) times daily. 11/25/17   Poulose, Bethel Born, NP  methocarbamol (ROBAXIN) 500 MG tablet Take 1 tablet (500 mg total) by mouth every 12 (twelve) hours. No alcohol within six hours of this 03/12/19   Hubbard Hartshorn, FNP  oxymetazoline (AFRIN) 0.05 % nasal spray Place 2 sprays in nostril ONLY WHEN BLEEDING. 03/12/19   Hubbard Hartshorn, FNP  saline (AYR) GEL Place 1 application into the nose every 4 (four) hours. 03/12/19   Hubbard Hartshorn, FNP  vitamin B-12 (CYANOCOBALAMIN) 500 MCG tablet Take 1 tablet (500 mcg total) by mouth daily. 10/07/18   Arnetha Courser, MD    Allergies Penicillins and Meclizine    Social History Social History   Tobacco Use  . Smoking status: Current Every Day Smoker    Packs/day: 0.50  Years: 25.00    Pack years: 12.50    Types: Cigarettes  . Smokeless tobacco: Never Used  Substance Use Topics  . Alcohol use: Yes    Alcohol/week: 14.0 standard drinks    Types: 14 Cans of beer per week  . Drug use: No    Review of Systems Patient denies headaches, rhinorrhea, blurry vision, numbness, shortness of breath, chest pain, edema, cough, abdominal pain, nausea, vomiting, diarrhea, dysuria, fevers, rashes or hallucinations unless otherwise stated above in HPI. ____________________________________________   PHYSICAL EXAM:  VITAL SIGNS: Vitals:   11/30/19 0533 11/30/19 0653  BP: (!) 160/79 138/80  Pulse: (!) 110   Resp: 18   Temp: 98.6 F (37 C)   SpO2: 98%     Constitutional: Alert and oriented.  Eyes:  Conjunctivae are normal.  Head: Atraumatic. Nose: No congestion/rhinnorhea. Mouth/Throat: Mucous membranes are moist.   Neck: No stridor. Painless ROM.  Cardiovascular: Normal rate, regular rhythm. Grossly normal heart sounds.  Good peripheral circulation. Respiratory: Normal respiratory effort.  No retractions. Lungs CTAB. Gastrointestinal: Soft and nontender. No distention. No abdominal bruits. No CVA tenderness. Genitourinary:  Musculoskeletal: No lower extremity tenderness nor edema.  No joint effusions. Neurologic:  CN- intact.  No facial droop, Normal FNF.  Normal heel to shin.  Sensation intact bilaterally. Normal speech and language. No gross focal neurologic deficits are appreciated. No gait instability. Skin:  Skin is warm, dry and intact. No rash noted. Psychiatric: Mood and affect are normal. Speech and behavior are normal.  ____________________________________________   LABS (all labs ordered are listed, but only abnormal results are displayed)  No results found for this or any previous visit (from the past 24 hour(s)). ____________________________________________ ____________________________________________  [id number]   ____________________________________________   PROCEDURES  Procedure(s) performed:  Procedures    Critical Care performed: no ____________________________________________   INITIAL IMPRESSION / ASSESSMENT AND PLAN / ED COURSE  Pertinent labs & imaging results that were available during my care of the patient were reviewed by me and considered in my medical decision making (see chart for details).   DDX: Tension, cluster, migraine, Covid, mass, SAH  Jenna Newton is a 54 y.o. who presents to the ED with symptoms as described above.  Patient very well appearing clinically.  Is afebrile hemodynamically stable.  Her neuro exam is intact and reassuring.  She is not have any signs of meningismus.  Does not seem consistent with subarachnoid.  Not  consistent with mass occupying lesion.  Patient is requesting a Covid test for work though she is not having any other symptoms.  Will test.  Patient declining any IV or IM medications.  Would like to trial Compazine and Tylenol.  Is requesting note for work.     The patient was evaluated in Emergency Department today for the symptoms described in the history of present illness. He/she was evaluated in the context of the global COVID-19 pandemic, which necessitated consideration that the patient might be at risk for infection with the SARS-CoV-2 virus that causes COVID-19. Institutional protocols and algorithms that pertain to the evaluation of patients at risk for COVID-19 are in a state of rapid change based on information released by regulatory bodies including the CDC and federal and state organizations. These policies and algorithms were followed during the patient's care in the ED.  As part of my medical decision making, I reviewed the following data within the [facility] notes reviewed and incorporated, Labs reviewed, notes from prior ED visits and  [place] Controlled Substance Database   ____________________________________________   FINAL CLINICAL IMPRESSION(S) / ED DIAGNOSES  Final diagnoses:  Acute nonintractable headache, unspecified headache type      NEW MEDICATIONS STARTED DURING THIS VISIT:  New Prescriptions   No medications on file     Note:  This document was prepared using Dragon voice recognition software and may include unintentional dictation errors.    Merlyn Lot, MD 11/30/19 [phone]

## 2019-12-06 ENCOUNTER — Encounter: Payer: Self-pay | Admitting: Family Medicine

## 2019-12-06 ENCOUNTER — Other Ambulatory Visit: Payer: Self-pay

## 2019-12-06 ENCOUNTER — Ambulatory Visit: Payer: 59 | Admitting: Family Medicine

## 2019-12-06 VITALS — BP 124/82 | HR 96 | Temp 97.5°F | Resp 14 | Ht 66.0 in | Wt 153.0 lb

## 2019-12-06 DIAGNOSIS — E785 Hyperlipidemia, unspecified: Secondary | ICD-10-CM

## 2019-12-06 DIAGNOSIS — Z09 Encounter for follow-up examination after completed treatment for conditions other than malignant neoplasm: Secondary | ICD-10-CM

## 2019-12-06 DIAGNOSIS — R040 Epistaxis: Secondary | ICD-10-CM | POA: Diagnosis not present

## 2019-12-06 DIAGNOSIS — J329 Chronic sinusitis, unspecified: Secondary | ICD-10-CM

## 2019-12-06 DIAGNOSIS — I10 Essential (primary) hypertension: Secondary | ICD-10-CM | POA: Diagnosis not present

## 2019-12-06 DIAGNOSIS — R42 Dizziness and giddiness: Secondary | ICD-10-CM

## 2019-12-06 DIAGNOSIS — Z720 Tobacco use: Secondary | ICD-10-CM

## 2019-12-06 DIAGNOSIS — Z5181 Encounter for therapeutic drug level monitoring: Secondary | ICD-10-CM

## 2019-12-06 DIAGNOSIS — H1013 Acute atopic conjunctivitis, bilateral: Secondary | ICD-10-CM

## 2019-12-06 DIAGNOSIS — J209 Acute bronchitis, unspecified: Secondary | ICD-10-CM

## 2019-12-06 MED ORDER — AYR SALINE NASAL NA GEL: 1.0000 "application " | NASAL | 3 refills | Status: DC

## 2019-12-06 MED ORDER — PREDNISONE 20 MG PO TABS: 40.0000 mg | ORAL_TABLET | Freq: Every day | ORAL | 0 refills | Status: AC

## 2019-12-06 MED ORDER — MECLIZINE HCL 25 MG PO TABS: 25.0000 mg | ORAL_TABLET | Freq: Three times a day (TID) | ORAL | 2 refills | Status: DC | PRN

## 2019-12-06 MED ORDER — MECLIZINE HCL 12.5 MG PO TABS: 12.5000 mg | ORAL_TABLET | Freq: Three times a day (TID) | ORAL | 1 refills | Status: DC | PRN

## 2019-12-06 MED ORDER — AMLODIPINE BESYLATE 5 MG PO TABS: 5.0000 mg | ORAL_TABLET | Freq: Every day | ORAL | 3 refills | Status: DC

## 2019-12-06 MED ORDER — DOXYCYCLINE HYCLATE 100 MG PO CAPS: 100.0000 mg | ORAL_CAPSULE | Freq: Two times a day (BID) | ORAL | 0 refills | Status: DC

## 2019-12-06 MED ORDER — ATORVASTATIN CALCIUM 40 MG PO TABS: 40.0000 mg | ORAL_TABLET | Freq: Every day | ORAL | 0 refills | Status: DC

## 2019-12-06 MED ORDER — ATORVASTATIN CALCIUM 40 MG PO TABS: 40.0000 mg | ORAL_TABLET | Freq: Every day | ORAL | 3 refills | Status: DC

## 2019-12-06 MED ORDER — CROMOLYN SODIUM 4 % OP SOLN: 1.0000 [drp] | Freq: Four times a day (QID) | OPHTHALMIC | 1 refills | Status: DC | PRN

## 2019-12-06 MED ORDER — MONTELUKAST SODIUM 10 MG PO TABS: 10.0000 mg | ORAL_TABLET | Freq: Every day | ORAL | 3 refills | Status: DC

## 2019-12-06 MED ORDER — TRIAMCINOLONE ACETONIDE 55 MCG/ACT NA AERO: 2.0000 | INHALATION_SPRAY | Freq: Every day | NASAL | 12 refills | Status: DC

## 2019-12-06 MED ORDER — LEVOCETIRIZINE DIHYDROCHLORIDE 5 MG PO TABS: 5.0000 mg | ORAL_TABLET | Freq: Every evening | ORAL | 5 refills | Status: DC

## 2019-12-06 NOTE — Progress Notes (Signed)
Patient ID: Jenna Newton, female    DOB: 05-16-1966, 54 y.o.   MRN: [id number]  PCP: Delsa Grana, PA-C  Chief Complaint  Patient presents with  . Follow-up  . Hypertension  . Hyperlipidemia  . Medication Refill    states she feels like she needs to take meclizine everyday to feel good  . Migraine    just went to ER last week for one    Subjective:   Jenna Newton is a 54 y.o. female, presents to clinic with CC of the following:  Patient presents for routine follow-up but also has several acute and acute on chronic complaints She is previously established with Dr. Sanda Klein, who left the practice, she has recently been seen by APPs in same clinic, Suezanne Cheshire and Raelyn Ensign, patient is new to me has not been seen for almost 9 months  Hypertension-patient has well-controlled blood pressure managed with amlodipine 5 mg daily.  When she takes her medication she denies any side effects.  BP Readings from Last 3 Encounters:  12/06/19 124/82  11/30/19 (!) 142/88  03/12/19 122/64  She does have a history of headaches and dizziness she even recently went to the ER for worsening symptoms but she is very congested, she does not believe is related to high blood pressure but her blood pressure was mildly elevated when in the ER but she also was in severe amount of pain.  Blood pressure is well controlled and at goal today.  HLD -patient is compliant with Lipitor 40 mg she previously was on lovastatin but has done well taking and tolerating Lipitor 40 mg.  She does try and watch what she eats and tries to avoid fatty or fried food.  Risk factors cardiovascular disease and atherosclerotic disease include smoking hypertension hyperlipidemia, no known atherosclerosis.  Denies any chest pain, shortness of breath, claudication symptoms, myalgias.  Patient had headache-recently seen in the ER records reviewed, patient does endorse continued nasal symptoms she is not currently on any allergy  medication.  She has seen ENT in the past also previously had nosebleeds she has not had a nosebleed in some time and she is very congested has a lot of pressure in her face forehead eyes and teeth.  Symptoms have been ongoing for at least the past 2 to 3 weeks.  She has not felt well but she has not had a fever sweats or chills.  He does request a refill of saline spray.  In her chart she does have Afrin but she had only use this to treat brisk epistaxis, which she has not had any bleeds in a while.  She also request a refill of meclizine which she states she uses 3 times daily as needed for vertigo symptoms and is the only medication she been taking that is an antihistamine she states that it dries her up a little bit helps with congestion ear symptoms of vertigo  Patient also endorses a little congestion in her chest with cough and wheeze that has gradually worsened over the past 1 to 2 weeks  Patient Active Problem List   Diagnosis Date Noted  . White matter disease of brain due to ischemia 12/04/2017  . Alcohol use 12/04/2017  . Medication monitoring encounter 04/18/2017  . Benign hypertension 04/18/2017  . Dyslipidemia 04/18/2017  . Dry mouth 03/14/2017  . Neuropathy 03/14/2017  . Tobacco abuse 03/14/2017  . Mononeuritis 05/12/2014  . Numbness of lower extremity 05/12/2014  . Right foot drop 05/12/2014  .  Weakness 05/12/2014      Current Outpatient Medications:  .  amLODipine (NORVASC) 5 MG tablet, Take 1 tablet (5 mg total) by mouth daily., Disp: 90 tablet, Rfl: 3 .  atorvastatin (LIPITOR) 40 MG tablet, Take 1 tablet (40 mg total) by mouth at bedtime. For cholesterol; this replaces lovastastin, Disp: 30 tablet, Rfl: 0 .  meclizine (ANTIVERT) 12.5 MG tablet, Take 1 tablet (12.5 mg total) by mouth 3 (three) times daily as needed for dizziness., Disp: 30 tablet, Rfl: 1 .  methocarbamol (ROBAXIN) 500 MG tablet, Take 1 tablet (500 mg total) by mouth every 12 (twelve) hours. No alcohol  within six hours of this, Disp: 20 tablet, Rfl: 0 .  oxymetazoline (AFRIN) 0.05 % nasal spray, Place 2 sprays in nostril ONLY WHEN BLEEDING., Disp: 30 mL, Rfl: 0 .  saline (AYR) GEL, Place 1 application into the nose every 4 (four) hours., Disp: 14.1 g, Rfl: 3 .  vitamin B-12 (CYANOCOBALAMIN) 500 MCG tablet, Take 1 tablet (500 mcg total) by mouth daily., Disp: 100 tablet, Rfl: 3 .  cromolyn (OPTICROM) 4 % ophthalmic solution, Place 1 drop into both eyes 4 (four) times daily as needed., Disp: 10 mL, Rfl: 1 .  doxycycline (VIBRAMYCIN) 100 MG capsule, Take 1 capsule (100 mg total) by mouth 2 (two) times daily., Disp: 20 capsule, Rfl: 0 .  levocetirizine (XYZAL) 5 MG tablet, Take 1 tablet (5 mg total) by mouth every evening., Disp: 30 tablet, Rfl: 5 .  montelukast (SINGULAIR) 10 MG tablet, Take 1 tablet (10 mg total) by mouth at bedtime., Disp: 30 tablet, Rfl: 3 .  triamcinolone (NASACORT) 55 MCG/ACT AERO nasal inhaler, Place 2 sprays into the nose daily., Disp: 1 Inhaler, Rfl: 12   Allergies  Allergen Reactions  . Penicillins Other (See Comments)    swelling  . Meclizine Other (See Comments)    Gave pt headaches      Family History  Problem Relation Age of Onset  . Diabetes Mother   . Hypertension Mother   . Emphysema Mother   . Cancer Father   . Diabetes Maternal Grandmother   . Hypertension Maternal Grandmother   . Blindness Maternal Grandmother   . Cancer Paternal Grandmother   . Cancer Paternal Grandfather   . Diabetes Brother   . Alcohol abuse Brother   . Cancer Maternal Grandfather        prostate  . Emphysema Maternal Grandfather      Social History   Socioeconomic History  . Marital status: Single    Spouse name: Not on file  . Number of children: 0  . Years of education: Not on file  . Highest education level: Bachelor's degree (e.g., BA, AB, BS)  Occupational History  . Not on file  Tobacco Use  . Smoking status: Current Every Day Smoker    Packs/day: 0.50     Years: 25.00    Pack years: 12.50    Types: Cigarettes  . Smokeless tobacco: Never Used  Substance and Sexual Activity  . Alcohol use: Yes    Alcohol/week: 14.0 standard drinks    Types: 14 Cans of beer per week  . Drug use: No  . Sexual activity: Yes    Partners: Male    Birth control/protection: Post-menopausal  Other Topics Concern  . Not on file  Social History Narrative  . Not on file   Social Determinants of Health   Financial Resource Strain: Low Risk   . Difficulty of Paying Living Expenses: Not  hard at all  Food Insecurity: No Food Insecurity  . Worried About Charity fundraiser in the Last Year: Never true  . Ran Out of Food in the Last Year: Never true  Transportation Needs: No Transportation Needs  . Lack of Transportation (Medical): No  . Lack of Transportation (Non-Medical): No  Physical Activity: Inactive  . Days of Exercise per Week: 0 days  . Minutes of Exercise per Session: 0 min  Stress: No Stress Concern Present  . Feeling of Stress : Not at all  Social Connections: Severely Isolated  . Frequency of Communication with Friends and Family: Never  . Frequency of Social Gatherings with Friends and Family: Never  . Attends Religious Services: Never  . Active Member of Clubs or Organizations: No  . Attends Archivist Meetings: Never  . Marital Status: Separated  Intimate Partner Violence: Not At Risk  . Fear of Current or Ex-Partner: No  . Emotionally Abused: No  . Physically Abused: No  . Sexually Abused: No    Chart Review Today: I personally reviewed active problem list, medication list, allergies, family history, social history, health maintenance, notes from last encounter, lab results, imaging with the patient/caregiver today. Reviewed ER visit from last week  Review of Systems 10 Systems reviewed and are negative for acute change except as noted in the HPI.     Objective:   Vitals:   12/06/19 1537  BP: 124/82  Pulse: 96    Resp: 14  Temp: (!) 97.5 F (36.4 C)  SpO2: 97%  Weight: 153 lb (69.4 kg)  Height: 5\' 6"  (1.676 m)    Body mass index is 24.69 kg/m.  Physical Exam Vitals and nursing note reviewed.  Constitutional:      General: She is not in acute distress.    Appearance: Normal appearance. She is well-developed. She is not ill-appearing, toxic-appearing or diaphoretic.     Interventions: Face mask in place.  HENT:     Head: Normocephalic and atraumatic.     Right Ear: Hearing, tympanic membrane, ear canal and external ear normal.     Left Ear: Hearing, tympanic membrane, ear canal and external ear normal.     Nose: Mucosal edema, congestion and rhinorrhea present.     Right Sinus: Maxillary sinus tenderness and frontal sinus tenderness present.     Left Sinus: Maxillary sinus tenderness and frontal sinus tenderness present.     Comments: Mild sinus ttp    Mouth/Throat:     Mouth: Mucous membranes are moist. Mucous membranes are not pale.     Pharynx: Uvula midline. No oropharyngeal exudate or uvula swelling.     Tonsils: No tonsillar abscesses.  Eyes:     General: Lids are normal. No scleral icterus.       Right eye: No discharge.        Left eye: No discharge.     Conjunctiva/sclera: Conjunctivae normal.     Pupils: Pupils are equal, round, and reactive to light.  Neck:     Trachea: Phonation normal. No tracheal deviation.  Cardiovascular:     Rate and Rhythm: Normal rate and regular rhythm.     Pulses: Normal pulses.          Radial pulses are 2+ on the right side and 2+ on the left side.       Posterior tibial pulses are 2+ on the right side and 2+ on the left side.     Heart sounds: Normal heart sounds.  No murmur. No friction rub. No gallop.   Pulmonary:     Effort: Pulmonary effort is normal. No respiratory distress.     Breath sounds: No stridor. Wheezing present. No rhonchi or rales.  Chest:     Chest wall: No tenderness.  Abdominal:     General: Bowel sounds are normal.  There is no distension.     Palpations: Abdomen is soft.     Tenderness: There is no abdominal tenderness. There is no guarding or rebound.  Musculoskeletal:        General: No deformity. Normal range of motion.     Cervical back: Normal range of motion and neck supple.     Right lower leg: No edema.     Left lower leg: No edema.  Lymphadenopathy:     Cervical: No cervical adenopathy.  Skin:    General: Skin is warm and dry.     Capillary Refill: Capillary refill takes less than 2 seconds.     Coloration: Skin is not jaundiced or pale.     Findings: No rash.  Neurological:     Mental Status: She is alert and oriented to person, place, and time.     Motor: No abnormal muscle tone.     Coordination: Coordination normal.     Gait: Gait normal.  Psychiatric:        Speech: Speech normal.        Behavior: Behavior normal.      Results for orders placed or performed during the hospital encounter of 11/30/19  SARS CORONAVIRUS 2 (TAT 6-24 HRS) Nasopharyngeal Nasopharyngeal Swab   Specimen: Nasopharyngeal Swab  Result Value Ref Range   SARS Coronavirus 2 NEGATIVE NEGATIVE        Assessment & Plan:    1. Benign hypertension Stable, well controlled today, continue amlodipine 5 mg encouraged her to work on healthy lifestyle, exercise, smoking cessation and low-salt diet - amLODipine (NORVASC) 5 MG tablet; Take 1 tablet (5 mg total) by mouth daily.  Dispense: 90 tablet; Refill: 3 - COMPLETE METABOLIC PANEL WITH GFR  2. Dyslipidemia Has not been seen for her hyperlipidemia in a while meds refilled today, will check lipid panel and liver function tests, she is not having any concerning signs or symptoms of atherosclerotic disease or any side effects of medication.  Encouraged her to continue to take Lipitor 40 mg every night, avoid transfat saturated fat, exercise and increase vegetables and whole grains in diet - COMPLETE METABOLIC PANEL WITH GFR - Lipid panel - atorvastatin  (LIPITOR) 40 MG tablet; Take 1 tablet (40 mg total) by mouth at bedtime. For cholesterol; this replaces lovastastin  Dispense: 90 tablet; Refill: 3  3. Recurrent epistaxis Previously had recurrent epistaxis which has not had some time she uses saline to help with this a suspect that her nosebleeds were previously from very dry or irritated nasal mucosa.  She has seen ENT for this in the past. It for now with very swollen nasal mucosa and turbinates she would benefit from using some steroid nasal spray she can use saline spray to help hydrate or help prevent nosebleeds, she should avoid Afrin unless using with a brisk nosebleed and if it does not stop with holding pressure trying Afrin she should be seen in urgent care or ER - saline (AYR) GEL; Place 1 application into the nose every 4 (four) hours.  Dispense: 14.1 g; Refill: 3  4. Recurrent rhinosinusitis Recurrent, not currently managing any of her allergy triggers, symptoms  have been ongoing for more than 2 weeks even recently to the ER for headache which I suspect was likely related to sinus infection or rhinosinusitis, due to prolonged symptoms antibiotics are indicated.  Did encourage her to start antihistamines, steroid nasal sprays, use antibiotics for 7 days finish to 10 days if symptoms are lingering.  And she may continue to have allergy symptoms and congestion encouraged her to stay compliant with daily use of allergy meds can use Claritin-D and time to the year because severe allergy or nasal symptoms.  Her blood pressure will likely tolerate a decongestant if she tolerates any other side effects. - doxycycline (VIBRAMYCIN) 100 MG capsule; Take 1 capsule (100 mg total) by mouth 2 (two) times daily.  Dispense: 20 capsule; Refill: 0 - levocetirizine (XYZAL) 5 MG tablet; Take 1 tablet (5 mg total) by mouth every evening.  Dispense: 30 tablet; Refill: 5 - montelukast (SINGULAIR) 10 MG tablet; Take 1 tablet (10 mg total) by mouth at bedtime.   Dispense: 30 tablet; Refill: 3 - triamcinolone (NASACORT) 55 MCG/ACT AERO nasal inhaler; Place 2 sprays into the nose daily.  Dispense: 1 Inhaler; Refill: 12  5. Acute atopic conjunctivitis of both eyes Patient also endorses severe itchy watery and swollen eyes encouraged to try [place] over-the-counter or can get and try cromolyn drops as needed for severe symptoms - levocetirizine (XYZAL) 5 MG tablet; Take 1 tablet (5 mg total) by mouth every evening.  Dispense: 30 tablet; Refill: 5 - triamcinolone (NASACORT) 55 MCG/ACT AERO nasal inhaler; Place 2 sprays into the nose daily.  Dispense: 1 Inhaler; Refill: 12 - cromolyn (OPTICROM) 4 % ophthalmic solution; Place 1 drop into both eyes 4 (four) times daily as needed.  Dispense: 10 mL; Refill: 1  6. Acute bronchitis, unspecified organism Patient initially did not mention anything about her breathing but she is a current smoker, on exam she did have some wheeze and some intermittent coughing, did choose doxycycline to be able to cover anything atypical in her chest with suspected acute bronchitis and also with sinusitis we will treat with steroids to help with wheeze and inflammation - doxycycline (VIBRAMYCIN) 100 MG capsule; Take 1 capsule (100 mg total) by mouth 2 (two) times daily.  Dispense: 20 capsule; Refill: 0 - predniSONE (DELTASONE) 20 MG tablet; Take 2 tablets (40 mg total) by mouth daily with breakfast for 5 days.  Dispense: 10 tablet; Refill: 0  7. Medication monitoring encounter Checking basic labs today  8. Vertigo No red flags today no findings concerning on exam for central vertigo, I do believe she was using meclizine frequently because she was getting relief with its antihistamine properties.  Vertigo may be secondary to eustachian tube dysfunction or her ENT and allergy problems hope that vertigo symptoms will decrease when she gets control of her allergies and nasal symptoms.  Use meclizine sparingly, follow-up with any worsening  symptoms or if she would like to go back to ENT - meclizine (ANTIVERT) 25 MG tablet; Take 1 tablet (25 mg total) by mouth 3 (three) times daily as needed for dizziness (vertigo).  Dispense: 90 tablet; Refill: 2  9. Tobacco abuse Patient unfortunately continues to smoke and is not currently interested in smoking cessation.  Did discuss today Smoking cessation instruction/counseling given:  counseled patient on the dangers of tobacco use, advised patient to stop smoking, and reviewed strategies to maximize success   10. Encounter for examination following treatment at hospital ER visit - reviewed encounter documentations, imaging, labs etc, discussed  with pt today    Delsa Grana, PA-C 12/06/19 4:05 PM

## 2019-12-07 LAB — COMPLETE METABOLIC PANEL WITH GFR
AG Ratio: 1.7 (calc) (ref 1.0–2.5)
ALT: 12 U/L (ref 6–29)
AST: 13 U/L (ref 10–35)
Albumin: 4.2 g/dL (ref 3.6–5.1)
Alkaline phosphatase (APISO): 89 U/L (ref 37–153)
BUN/Creatinine Ratio: 16 (calc) (ref 6–22)
BUN: 8 mg/dL (ref 7–25)
CO2: 25 mmol/L (ref 20–32)
Calcium: 9.2 mg/dL (ref 8.6–10.4)
Chloride: 107 mmol/L (ref 98–110)
Creat: 0.49 mg/dL — ABNORMAL LOW (ref 0.50–1.05)
GFR, Est African American: 128 mL/min/{1.73_m2} (ref >=60)
GFR, Est Non African American: 110 mL/min/{1.73_m2} (ref >=60)
Globulin: 2.5 g/dL (calc) (ref 1.9–3.7)
Glucose, Bld: 93 mg/dL (ref 65–99)
Potassium: 4.1 mmol/L (ref 3.5–5.3)
Sodium: 143 mmol/L (ref 135–146)
Total Bilirubin: 0.2 mg/dL (ref 0.2–1.2)
Total Protein: 6.7 g/dL (ref 6.1–8.1)

## 2019-12-07 LAB — LIPID PANEL
Cholesterol: 144 mg/dL (ref <200)
HDL: 33 mg/dL — ABNORMAL LOW (ref >=50)
LDL Cholesterol (Calc): 85 mg/dL (calc)
Non-HDL Cholesterol (Calc): 111 mg/dL (calc) (ref <130)
Total CHOL/HDL Ratio: 4.4 (calc) (ref <5.0)
Triglycerides: 159 mg/dL — ABNORMAL HIGH (ref <150)

## 2019-12-15 ENCOUNTER — Encounter: Payer: Self-pay | Admitting: Family Medicine

## 2020-01-04 ENCOUNTER — Other Ambulatory Visit: Payer: Self-pay

## 2020-01-04 ENCOUNTER — Telehealth (INDEPENDENT_AMBULATORY_CARE_PROVIDER_SITE_OTHER): Payer: 59 | Admitting: Family Medicine

## 2020-01-04 ENCOUNTER — Encounter: Payer: Self-pay | Admitting: Family Medicine

## 2020-01-04 VITALS — Ht 66.0 in | Wt 153.0 lb

## 2020-01-04 DIAGNOSIS — J209 Acute bronchitis, unspecified: Secondary | ICD-10-CM

## 2020-01-04 DIAGNOSIS — R42 Dizziness and giddiness: Secondary | ICD-10-CM

## 2020-01-04 DIAGNOSIS — Z720 Tobacco use: Secondary | ICD-10-CM | POA: Diagnosis not present

## 2020-01-04 DIAGNOSIS — I10 Essential (primary) hypertension: Secondary | ICD-10-CM | POA: Diagnosis not present

## 2020-01-04 DIAGNOSIS — E785 Hyperlipidemia, unspecified: Secondary | ICD-10-CM | POA: Diagnosis not present

## 2020-01-04 DIAGNOSIS — H1013 Acute atopic conjunctivitis, bilateral: Secondary | ICD-10-CM

## 2020-01-04 DIAGNOSIS — J329 Chronic sinusitis, unspecified: Secondary | ICD-10-CM | POA: Diagnosis not present

## 2020-01-04 NOTE — Progress Notes (Signed)
Name: Jenna Newton   MRN: [id number]    DOB: February 23, 1966   Date:01/04/2020       Progress Note  Subjective:    Chief Complaint  Chief Complaint  Patient presents with  . Follow-up  . Hyperlipidemia  . Hypertension    I connected with  Dimas Millin  on 01/04/20 at  1:20 PM EDT by a video enabled telemedicine application and verified that I am speaking with the correct person using two identifiers.  I discussed the limitations of evaluation and management by telemedicine and the availability of in person appointments. The patient expressed understanding and agreed to proceed. Staff also discussed with the patient that there may be a patient responsible charge related to this service. Patient Location: home Provider Location: [facility] Additional Individuals present:  None  HPI At patient's last office visit when she was new to me she had multiple acute complaints and had not been seen in about 9 months.  We did address her acute complaints and rapid labs for chronic conditions and today she is following up on lab results and doing her routine visit more thoroughly for her chronic conditions including hypertension and hyperlipidemia.  Hypertension:  Currently managed on amlodipine 5 mg daily, previously had elevated blood pressure when she was in the ER with severe headaches, that has been treated with antibiotics and allergy medication, blood pressures have improved Pt reports good med compliance and denies any SE.  No lightheadedness, hypotension, syncope. Blood pressure today is well controlled. BP Readings from Last 3 Encounters:  12/06/19 124/82  11/30/19 (!) 142/88  03/12/19 122/64  Pts labs from last month show normal renal function and electrolytes Pt denies CP, SOB, exertional sx, LE edema, palpitation, Ha's, visual disturbances Dietary efforts for BP?  Tries to be healthy and avoid extra salt in her diet   Hyperlipidemia: Current Medication Regimen: Lipitor 40 mg in  the last year she had switch from lovastatin to Lipitor, she is continuing to tolerate without any side effects or myalgias she does continue to try and avoid fatty foods or fried foods. Last Lipids: Lab Results  Component Value Date   CHOL 144 12/06/2019   HDL 33 (L) 12/06/2019   LDLCALC 85 12/06/2019   TRIG 159 (H) 12/06/2019   CHOLHDL 4.4 12/06/2019  Reviewed patient's last labs with her today her LDL is at goal, HDL is low triglycerides were mildly high but overall total cholesterol was also well controlled she does have risk factors including hypertension, hyperlipidemia, current smoker which we have discussed. - Denies: Chest pain, shortness of breath, myalgias.   Current smoker, continues to smoke does not have any desire to currently stop smoking, reviewed smoking cessation offered resources medications if and when she desires to stop smoking.  Pt has severe and frequent vertigo that she is managing with frequent use of meclizine.  At her last office visit she did have severe nasal symptoms swelling and congestion she was instructed to start treatment for allergic rhinitis this may have been exacerbating her vertigo episodes.  She previously was having nosebleeds and was given saline spray to use but I did not see any sign of nosebleeds nor had patient had any in a while.  Encouraged her to use a steroid and antihistamine see if this would decrease her vertigo episodes.   She has been able to get the prescribed antihistamines she started Xyzal, Singulair and Nasacort she notes that her vertigo symptoms and her headaches significantly improved.  She  is having less vertigo she is using 1 meclizine on average in the morning and at helps her all day.  She is no longer having headaches, her cough and wheeze that she had had we have treated with the doxycycline and a short burst of steroids she does continue to feel a little bit wheezy but overall improved. She was also having severe allergic  conjunctivitis and was given cromolyn drops.  Her eye symptoms have also improved but not completely resolved  She feels that her nasal congestion headaches vertigo are all improving after doing this antibiotics she has not had a return and she has been mostly compliant with daily medicines.  Patient Active Problem List   Diagnosis Date Noted  . White matter disease of brain due to ischemia 12/04/2017  . Alcohol use 12/04/2017  . Medication monitoring encounter 04/18/2017  . Benign hypertension 04/18/2017  . Dyslipidemia 04/18/2017  . Dry mouth 03/14/2017  . Neuropathy 03/14/2017  . Tobacco abuse 03/14/2017  . Mononeuritis 05/12/2014  . Numbness of lower extremity 05/12/2014  . Right foot drop 05/12/2014  . Weakness 05/12/2014    Social History   Tobacco Use  . Smoking status: Current Every Day Smoker    Packs/day: 0.50    Years: 25.00    Pack years: 12.50    Types: Cigarettes  . Smokeless tobacco: Never Used  Substance Use Topics  . Alcohol use: Yes    Alcohol/week: 14.0 standard drinks    Types: 14 Cans of beer per week     Current Outpatient Medications:  .  amLODipine (NORVASC) 5 MG tablet, Take 1 tablet (5 mg total) by mouth daily., Disp: 90 tablet, Rfl: 3 .  atorvastatin (LIPITOR) 40 MG tablet, Take 1 tablet (40 mg total) by mouth at bedtime. For cholesterol; this replaces lovastastin, Disp: 90 tablet, Rfl: 3 .  cromolyn (OPTICROM) 4 % ophthalmic solution, Place 1 drop into both eyes 4 (four) times daily as needed., Disp: 10 mL, Rfl: 1 .  doxycycline (VIBRAMYCIN) 100 MG capsule, Take 1 capsule (100 mg total) by mouth 2 (two) times daily., Disp: 20 capsule, Rfl: 0 .  levocetirizine (XYZAL) 5 MG tablet, Take 1 tablet (5 mg total) by mouth every evening., Disp: 30 tablet, Rfl: 5 .  meclizine (ANTIVERT) 25 MG tablet, Take 1 tablet (25 mg total) by mouth 3 (three) times daily as needed for dizziness (vertigo)., Disp: 90 tablet, Rfl: 2 .  methocarbamol (ROBAXIN) 500 MG  tablet, Take 1 tablet (500 mg total) by mouth every 12 (twelve) hours. No alcohol within six hours of this, Disp: 20 tablet, Rfl: 0 .  montelukast (SINGULAIR) 10 MG tablet, Take 1 tablet (10 mg total) by mouth at bedtime., Disp: 30 tablet, Rfl: 3 .  oxymetazoline (AFRIN) 0.05 % nasal spray, Place 2 sprays in nostril ONLY WHEN BLEEDING., Disp: 30 mL, Rfl: 0 .  saline (AYR) GEL, Place 1 application into the nose every 4 (four) hours., Disp: 14.1 g, Rfl: 3 .  triamcinolone (NASACORT) 55 MCG/ACT AERO nasal inhaler, Place 2 sprays into the nose daily., Disp: 1 Inhaler, Rfl: 12 .  vitamin B-12 (CYANOCOBALAMIN) 500 MCG tablet, Take 1 tablet (500 mcg total) by mouth daily., Disp: 100 tablet, Rfl: 3  Allergies  Allergen Reactions  . Penicillins Other (See Comments)    swelling  . Meclizine Other (See Comments)    Gave pt headaches     I personally reviewed active problem list, medication list, allergies, family history, social history, health maintenance,  notes from last encounter, lab results, imaging with the patient/caregiver today.   Review of Systems  10 Systems reviewed and are negative for acute change except as noted in the HPI.   Objective:   Virtual encounter, vitals limited, only able to obtain the following Today's Vitals   01/04/20 1308  Weight: 153 lb (69.4 kg)  Height: 5\' 6"  (1.676 m)   Body mass index is 24.69 kg/m. Nursing Note and Vital Signs reviewed.  Physical Exam Vitals and nursing note reviewed.  Pulmonary:     Effort: No respiratory distress.  Neurological:     Mental Status: She is alert.  Psychiatric:        Mood and Affect: Mood normal.        Behavior: Behavior normal.     PE limited by telephone encounter  No results found for this or any previous visit (from the past 72 hour(s)).  Assessment and Plan:   1. Benign hypertension Continues to be well controlled and stable continue amlodipine 5 mg labs reviewed from last month Labs done at last  visit and reviewed today, medications were sent in and refilled for the next year  2. Dyslipidemia Patient is compliant with and tolerating Lipitor 40 mg no myalgias, LDL is at goal and well-controlled Encouraged her to continue her diet and lifestyle efforts Labs done at last visit and reviewed today, medications were sent in and refilled for the next year  3. Tobacco abuse Again discussed smoking cessation and explained the benefits of stopping smoking and the risks of continued tobacco use  4. Recurrent rhinosinusitis Very congested and swollen with a history of nosebleeds.  Her symptoms have improved with using Xyzal, Singulair and Nasacort Encouraged her to continue treatment through her allergy seasons  5. Acute atopic conjunctivitis of both eyes Improving symptoms with the above treatment noted #4 and with use of as needed cromolyn drops  6. Vertigo Improvement of her vertigo severity and frequency she currently feels that vertigo is well controlled with only once a day meclizine no longer feeling like she needs it 3 times a day  Her headaches have resolved I do think that was likely secondary to a sinus infection Did encourage her to follow-up if any worsening or if she would like a referral to ENT Patient continues to endorse some slight wheeze with smoking and history of bronchitis she likely has some aspect of COPD encouraged her to follow-up if she is not having her respiratory symptoms return completely to her baseline, she did do steroids doxycycline and had some improvement.   -Red flags and when to present for emergency care or RTC including fever >101.74F, chest pain, shortness of breath, new/worsening/un-resolving symptoms, reviewed with patient at time of visit. Follow up and care instructions discussed and provided in AVS. - I discussed the assessment and treatment plan with the patient. The patient was provided an opportunity to ask questions and all were answered. The  patient agreed with the plan and demonstrated an understanding of the instructions.  I provided 30+ minutes of non-face-to-face time during this encounter.  Delsa Grana, PA-C 01/04/20 1:35 PM

## 2020-07-06 ENCOUNTER — Ambulatory Visit: Payer: 59 | Admitting: Family Medicine

## 2020-07-07 ENCOUNTER — Other Ambulatory Visit: Payer: Self-pay | Admitting: Family Medicine

## 2020-07-07 DIAGNOSIS — J329 Chronic sinusitis, unspecified: Secondary | ICD-10-CM

## 2020-09-11 ENCOUNTER — Encounter: Payer: Self-pay | Admitting: Family Medicine

## 2020-09-11 ENCOUNTER — Ambulatory Visit: Payer: BC Managed Care – PPO | Admitting: Family Medicine

## 2020-09-11 ENCOUNTER — Other Ambulatory Visit: Payer: Self-pay

## 2020-09-11 VITALS — BP 128/68 | HR 100 | Temp 98.6°F | Resp 16 | Ht 66.0 in | Wt 135.3 lb

## 2020-09-11 DIAGNOSIS — R42 Dizziness and giddiness: Secondary | ICD-10-CM

## 2020-09-11 DIAGNOSIS — Z1159 Encounter for screening for other viral diseases: Secondary | ICD-10-CM

## 2020-09-11 DIAGNOSIS — Z720 Tobacco use: Secondary | ICD-10-CM

## 2020-09-11 DIAGNOSIS — E785 Hyperlipidemia, unspecified: Secondary | ICD-10-CM | POA: Diagnosis not present

## 2020-09-11 DIAGNOSIS — I10 Essential (primary) hypertension: Secondary | ICD-10-CM | POA: Diagnosis not present

## 2020-09-11 DIAGNOSIS — G629 Polyneuropathy, unspecified: Secondary | ICD-10-CM | POA: Diagnosis not present

## 2020-09-11 DIAGNOSIS — J329 Chronic sinusitis, unspecified: Secondary | ICD-10-CM

## 2020-09-11 DIAGNOSIS — R9082 White matter disease, unspecified: Secondary | ICD-10-CM

## 2020-09-11 DIAGNOSIS — Z5181 Encounter for therapeutic drug level monitoring: Secondary | ICD-10-CM

## 2020-09-11 DIAGNOSIS — I998 Other disorder of circulatory system: Secondary | ICD-10-CM

## 2020-09-11 DIAGNOSIS — J320 Chronic maxillary sinusitis: Secondary | ICD-10-CM

## 2020-09-11 DIAGNOSIS — H1013 Acute atopic conjunctivitis, bilateral: Secondary | ICD-10-CM

## 2020-09-11 MED ORDER — DOXYCYCLINE HYCLATE 100 MG PO TABS: 100.0000 mg | ORAL_TABLET | Freq: Two times a day (BID) | ORAL | 0 refills | Status: AC

## 2020-09-11 MED ORDER — PREDNISONE 20 MG PO TABS
40.0000 mg | ORAL_TABLET | Freq: Every day | ORAL | 0 refills | Status: DC
Start: 2020-09-11 — End: 2021-04-09

## 2020-09-11 MED ORDER — ATORVASTATIN CALCIUM 40 MG PO TABS
40.0000 mg | ORAL_TABLET | Freq: Every day | ORAL | 3 refills | Status: DC
Start: 2020-09-11 — End: 2021-05-25

## 2020-09-11 MED ORDER — AMLODIPINE BESYLATE 5 MG PO TABS: 5.0000 mg | ORAL_TABLET | Freq: Every day | ORAL | 3 refills | Status: DC

## 2020-09-11 MED ORDER — MONTELUKAST SODIUM 10 MG PO TABS: 10.0000 mg | ORAL_TABLET | Freq: Every day | ORAL | 3 refills | Status: DC

## 2020-09-11 MED ORDER — VITAMIN B-12 500 MCG PO TABS
500.0000 ug | ORAL_TABLET | Freq: Every day | ORAL | 3 refills | Status: DC
Start: 2020-09-11 — End: 2022-03-09

## 2020-09-11 MED ORDER — CROMOLYN SODIUM 4 % OP SOLN
1.0000 [drp] | Freq: Four times a day (QID) | OPHTHALMIC | 1 refills | Status: DC | PRN
Start: 2020-09-11 — End: 2020-12-01

## 2020-09-11 MED ORDER — MECLIZINE HCL 25 MG PO TABS: 25.0000 mg | ORAL_TABLET | Freq: Three times a day (TID) | ORAL | 11 refills | Status: DC | PRN

## 2020-09-11 MED ORDER — LEVOCETIRIZINE DIHYDROCHLORIDE 5 MG PO TABS: 5.0000 mg | ORAL_TABLET | Freq: Every evening | ORAL | 5 refills | Status: DC

## 2020-09-11 NOTE — Progress Notes (Signed)
Name: Jenna Newton   MRN: [id number]    DOB: 1966/08/01   Date:11/30/2020       Progress Note  Chief Complaint  Patient presents with  . Hypertension  . Hyperlipidemia    6 month follow up     Subjective:   Jenna Newton is a 55 y.o. female, presents to clinic for routine f/up  Hypertension:  Currently managed on norvasc Pt reports good med compliance and denies any SE.   Blood pressure today is well controlled. BP Readings from Last 3 Encounters:  09/11/20 128/68  12/06/19 124/82  11/30/19 (!) 142/88   Pt denies CP, SOB, exertional sx, LE edema, palpitation, Ha's, visual disturbances, lightheadedness, hypotension, syncope.   Hyperlipidemia: Currently treated with lipitor 40, pt reports good med compliance Last Lipids: Lab Results  Component Value Date   CHOL 144 12/06/2019   HDL 33 (L) 12/06/2019   LDLCALC 85 12/06/2019   TRIG 159 (H) 12/06/2019   CHOLHDL 4.4 12/06/2019   - Denies: Chest pain, shortness of breath, myalgias, claudication  Chronic allergies - nasal - and eye allergies, recurrent sinusitis and hx of vertigo as well  Hx of ischemic white matter disease/CVA? On statin Not on ASA or other antiplatelet   Current smoker - Smoking cessation instruction/counseling given:  counseled patient on the dangers of tobacco use, advised patient to stop smoking, and reviewed strategies to maximize success     Current Outpatient Medications:  .  predniSONE (DELTASONE) 20 MG tablet, Take 2 tablets (40 mg total) by mouth daily., Disp: 10 tablet, Rfl: 0 .  saline (AYR) GEL, Place 1 application into the nose every 4 (four) hours., Disp: 14.1 g, Rfl: 3 .  triamcinolone (NASACORT) 55 MCG/ACT AERO nasal inhaler, Place 2 sprays into the nose daily., Disp: 1 Inhaler, Rfl: 12 .  amLODipine (NORVASC) 5 MG tablet, Take 1 tablet (5 mg total) by mouth daily., Disp: 90 tablet, Rfl: 3 .  atorvastatin (LIPITOR) 40 MG tablet, Take 1 tablet (40 mg total) by mouth at bedtime.  For cholesterol; this replaces lovastastin, Disp: 90 tablet, Rfl: 3 .  cromolyn (OPTICROM) 4 % ophthalmic solution, Place 1 drop into both eyes 4 (four) times daily as needed., Disp: 10 mL, Rfl: 1 .  levocetirizine (XYZAL) 5 MG tablet, Take 1 tablet (5 mg total) by mouth every evening., Disp: 30 tablet, Rfl: 5 .  meclizine (ANTIVERT) 25 MG tablet, Take 1 tablet (25 mg total) by mouth 3 (three) times daily as needed for dizziness (vertigo)., Disp: 90 tablet, Rfl: 11 .  montelukast (SINGULAIR) 10 MG tablet, Take 1 tablet (10 mg total) by mouth at bedtime., Disp: 90 tablet, Rfl: 3 .  vitamin B-12 (CYANOCOBALAMIN) 500 MCG tablet, Take 1 tablet (500 mcg total) by mouth daily., Disp: 100 tablet, Rfl: 3  Patient Active Problem List   Diagnosis Date Noted  . White matter disease of brain due to ischemia 12/04/2017  . Alcohol use 12/04/2017  . Medication monitoring encounter 04/18/2017  . Benign hypertension 04/18/2017  . Dyslipidemia 04/18/2017  . Dry mouth 03/14/2017  . Neuropathy 03/14/2017  . Tobacco abuse 03/14/2017  . Mononeuritis 05/12/2014  . Numbness of lower extremity 05/12/2014  . Right foot drop 05/12/2014  . Weakness 05/12/2014    Past Surgical History:  Procedure Laterality Date  . none      Family History  Problem Relation Age of Onset  . Diabetes Mother   . Hypertension Mother   . Emphysema Mother   . Cancer  Father   . Diabetes Maternal Grandmother   . Hypertension Maternal Grandmother   . Blindness Maternal Grandmother   . Cancer Paternal Grandmother   . Cancer Paternal Grandfather   . Diabetes Brother   . Alcohol abuse Brother   . Cancer Maternal Grandfather        prostate  . Emphysema Maternal Grandfather     Social History   Tobacco Use  . Smoking status: Current Every Day Smoker    Packs/day: 0.50    Years: 25.00    Pack years: 12.50    Types: Cigarettes  . Smokeless tobacco: Never Used  Vaping Use  . Vaping Use: Never used  Substance Use Topics   . Alcohol use: Yes    Alcohol/week: 14.0 standard drinks    Types: 14 Cans of beer per week  . Drug use: No     Allergies  Allergen Reactions  . Penicillins Other (See Comments)    swelling  . Meclizine Other (See Comments)    Gave pt headaches     Health Maintenance  Topic Date Due  . Hepatitis C Screening  Never done  . INFLUENZA VACCINE  11/30/2020 (Originally 04/02/2020)  . MAMMOGRAM  12/05/2020 (Originally 10/08/2019)  . COLONOSCOPY (Pts 45-60yrs Insurance coverage will need to be confirmed)  12/05/2020 (Originally 03/11/2010)  . TETANUS/TDAP  12/05/2020 (Originally 09/02/2018)  . COVID-19 Vaccine (3 - Booster for Moderna series) 12/11/2020  . PAP SMEAR-Modifier  10/07/2021  . HIV Screening  Completed  . HPV VACCINES  Aged Out    Chart Review Today: I personally reviewed active problem list, medication list, allergies, family history, social history, health maintenance, notes from last encounter, lab results, imaging with the patient/caregiver today.   Review of Systems  10 Systems reviewed and are negative for acute change except as noted in the HPI.  Objective:   Vitals:   09/11/20 1544  BP: 128/68  Pulse: 100  Resp: 16  Temp: 98.6 F (37 C)  TempSrc: Oral  SpO2: 95%  Weight: 135 lb 4.8 oz (61.4 kg)  Height: 5\' 6"  (1.676 m)    Body mass index is 21.84 kg/m.  Physical Exam Vitals and nursing note reviewed.  Constitutional:      General: She is not in acute distress.    Appearance: Normal appearance. She is well-developed. She is not ill-appearing, toxic-appearing or diaphoretic.     Interventions: Face mask in place.  HENT:     Head: Normocephalic and atraumatic.     Right Ear: Ear canal and external ear normal.     Left Ear: Ear canal and external ear normal.     Nose: Mucosal edema, congestion and rhinorrhea present.     Right Turbinates: Enlarged and swollen.     Left Turbinates: Enlarged and swollen.     Right Sinus: Maxillary sinus tenderness and  frontal sinus tenderness present.     Left Sinus: Maxillary sinus tenderness and frontal sinus tenderness present.     Mouth/Throat:     Mouth: Mucous membranes are moist.     Pharynx: Posterior oropharyngeal erythema present. No oropharyngeal exudate.  Eyes:     General: Lids are normal. No scleral icterus.       Right eye: No discharge.        Left eye: No discharge.  Neck:     Trachea: Phonation normal. No tracheal deviation.  Cardiovascular:     Rate and Rhythm: Normal rate and regular rhythm.     Pulses: Normal  pulses.          Radial pulses are 2+ on the right side and 2+ on the left side.       Posterior tibial pulses are 2+ on the right side and 2+ on the left side.     Heart sounds: Normal heart sounds. No murmur heard. No friction rub. No gallop.   Pulmonary:     Effort: Pulmonary effort is normal. No respiratory distress.     Breath sounds: Normal breath sounds. No stridor. No wheezing, rhonchi or rales.  Chest:     Chest wall: No tenderness.  Abdominal:     General: Bowel sounds are normal. There is no distension.     Palpations: Abdomen is soft.  Musculoskeletal:     Right lower leg: No edema.     Left lower leg: No edema.  Skin:    General: Skin is warm and dry.     Coloration: Skin is not jaundiced or pale.     Findings: No rash.  Neurological:     Mental Status: She is alert.     Motor: No abnormal muscle tone.     Gait: Gait normal.  Psychiatric:        Mood and Affect: Mood normal.        Speech: Speech normal.        Behavior: Behavior normal.         Assessment & Plan:     ICD-10-CM   1. Benign hypertension  X93 COMPLETE METABOLIC PANEL WITH GFR    amLODipine (NORVASC) 5 MG tablet   stable, well controlled, BP at goal  2. Neuropathy  G62.9 CBC with Differential/Platelet    COMPLETE METABOLIC PANEL WITH GFR    vitamin B-12 (CYANOCOBALAMIN) 500 MCG tablet  3. Tobacco abuse  Z72.0    tobacco counseling given  4. Dyslipidemia  E78.5 Lipid  panel    atorvastatin (LIPITOR) 40 MG tablet   compliant with statin, continue meds and diet/lifestyle efforts  5. Recurrent rhinosinusitis  J32.9 predniSONE (DELTASONE) 20 MG tablet    levocetirizine (XYZAL) 5 MG tablet    montelukast (SINGULAIR) 10 MG tablet    doxycycline (VIBRA-TABS) 100 MG tablet  6. Acute atopic conjunctivitis of both eyes  H10.13 predniSONE (DELTASONE) 20 MG tablet    levocetirizine (XYZAL) 5 MG tablet    cromolyn (OPTICROM) 4 % ophthalmic solution  7. Vertigo  R42 meclizine (ANTIVERT) 25 MG tablet  8. Chronic maxillary sinusitis  J32.0 doxycycline (VIBRA-TABS) 100 MG tablet  9. White matter disease of brain due to ischemia  R90.82    I99.8   10. Encounter for hepatitis C screening test for low risk patient  Z11.59 Hepatitis C antibody  11. Medication monitoring encounter  Z51.81 CBC with Differential/Platelet    Lipid panel    COMPLETE METABOLIC PANEL WITH GFR     Return in about 6 months (around 03/11/2021) for Routine follow-up.   Delsa Grana, PA-C 11/30/20 12:05 PM

## 2020-11-06 DIAGNOSIS — R42 Dizziness and giddiness: Secondary | ICD-10-CM | POA: Diagnosis not present

## 2020-11-30 ENCOUNTER — Encounter: Payer: Self-pay | Admitting: Family Medicine

## 2020-11-30 DIAGNOSIS — R42 Dizziness and giddiness: Secondary | ICD-10-CM | POA: Diagnosis not present

## 2020-11-30 DIAGNOSIS — H903 Sensorineural hearing loss, bilateral: Secondary | ICD-10-CM | POA: Diagnosis not present

## 2020-11-30 DIAGNOSIS — H6982 Other specified disorders of Eustachian tube, left ear: Secondary | ICD-10-CM | POA: Diagnosis not present

## 2020-12-01 ENCOUNTER — Other Ambulatory Visit: Payer: Self-pay | Admitting: Family Medicine

## 2020-12-01 DIAGNOSIS — H1013 Acute atopic conjunctivitis, bilateral: Secondary | ICD-10-CM

## 2020-12-11 DIAGNOSIS — H8111 Benign paroxysmal vertigo, right ear: Secondary | ICD-10-CM | POA: Diagnosis not present

## 2021-04-05 ENCOUNTER — Encounter: Payer: Self-pay | Admitting: Unknown Physician Specialty

## 2021-04-05 ENCOUNTER — Telehealth (INDEPENDENT_AMBULATORY_CARE_PROVIDER_SITE_OTHER): Payer: BC Managed Care – PPO | Admitting: Unknown Physician Specialty

## 2021-04-05 ENCOUNTER — Other Ambulatory Visit: Payer: Self-pay

## 2021-04-05 ENCOUNTER — Telehealth: Payer: Self-pay

## 2021-04-05 ENCOUNTER — Ambulatory Visit: Payer: Self-pay

## 2021-04-05 DIAGNOSIS — J00 Acute nasopharyngitis [common cold]: Secondary | ICD-10-CM

## 2021-04-05 MED ORDER — SULFAMETHOXAZOLE-TRIMETHOPRIM 800-160 MG PO TABS: 1.0000 | ORAL_TABLET | Freq: Two times a day (BID) | ORAL | 0 refills | Status: DC

## 2021-04-05 NOTE — Telephone Encounter (Signed)
Pt has been scheduled phone visit today with Jenna Newton.  Please call work number and they will get her to the phone.

## 2021-04-05 NOTE — Telephone Encounter (Signed)
Third attempt to reach pt at number provided. Left VM to CB each time.  Per agent: "Patient called in  say that her nostril is inflamed and sore to the touch may be swollen on the inside due to her sinus issues. Please advise Ph# [phone] "

## 2021-04-05 NOTE — Progress Notes (Signed)
LMP 01/23/2015    Subjective:    Patient ID: Jenna Newton, female    DOB: 1966/04/23, 55 y.o.   MRN: [id number]  HPI: Jenna Newton is a 55 y.o. female  Chief Complaint  Patient presents with   Sinusitis   This visit was completed via telephone due to the restrictions of the COVID-19 pandemic. All issues as above were discussed and addressed but no physical exam was performed. If it was felt that the patient should be evaluated in the office, they were directed there. The patient verbally consented to this visit. Patient was unable to complete an audio/visual visit due to Technical difficulties. Location of the patient: work Location of the provider: work Those involved with this call:  Time spent on call: 15 minutes I verified patient identity using two factors (patient name and date of birth). Patient consents verbally to being seen via telemedicine visit today.    Sudden onset of nasal tenderness.  Difficult to blow her nose since in hurts badly.  No cheek or forehead tenderness.  Denies fever.  Hx of chronic sinus infections.  No sore throat, cough.  Uses Nascort.  No illegal substances  Relevant past medical, surgical, family and social history reviewed and updated as indicated. Interim medical history since our last visit reviewed. Allergies and medications reviewed and updated.  Review of Systems  Per HPI unless specifically indicated above     Objective:    LMP 01/23/2015   Wt Readings from Last 3 Encounters:  09/11/20 135 lb 4.8 oz (61.4 kg)  01/04/20 153 lb (69.4 kg)  12/06/19 153 lb (69.4 kg)    Physical Exam Neurological:     Mental Status: She is alert.  Psychiatric:        Mood and Affect: Mood normal.    Results for orders placed or performed in visit on 12/06/19  COMPLETE METABOLIC PANEL WITH GFR  Result Value Ref Range   Glucose, Bld 93 65 - 99 mg/dL   BUN 8 7 - 25 mg/dL   Creat 0.49 (L) 0.50 - 1.05 mg/dL   GFR, Est Non African American 110 >  OR = 60 mL/min/1.84m2   GFR, Est African American 128 > OR = 60 mL/min/1.53m2   BUN/Creatinine Ratio 16 6 - 22 (calc)   Sodium 143 135 - 146 mmol/L   Potassium 4.1 3.5 - 5.3 mmol/L   Chloride 107 98 - 110 mmol/L   CO2 25 20 - 32 mmol/L   Calcium 9.2 8.6 - 10.4 mg/dL   Total Protein 6.7 6.1 - 8.1 g/dL   Albumin 4.2 3.6 - 5.1 g/dL   Globulin 2.5 1.9 - 3.7 g/dL (calc)   AG Ratio 1.7 1.0 - 2.5 (calc)   Total Bilirubin 0.2 0.2 - 1.2 mg/dL   Alkaline phosphatase (APISO) 89 37 - 153 U/L   AST 13 10 - 35 U/L   ALT 12 6 - 29 U/L  Lipid panel  Result Value Ref Range   Cholesterol 144 <200 mg/dL   HDL 33 (L) > OR = 50 mg/dL   Triglycerides 159 (H) <150 mg/dL   LDL Cholesterol (Calc) 85 mg/dL (calc)   Total CHOL/HDL Ratio 4.4 <5.0 (calc)   Non-HDL Cholesterol (Calc) 111 <130 mg/dL (calc)      Assessment & Plan:   Problem List Items Addressed This Visit   None Visit Diagnoses     Acute rhinitis    -  Primary   Pt with acute soreness and inflammation of  nostril.  She does use Nasacort and may have an infected ulcer.  Rx for sulfur        Follow up plan: Return if symptoms worsen or fail to improve.

## 2021-04-05 NOTE — Telephone Encounter (Signed)
Copied from [place] [phone]. Topic: General - Other >> Apr 05, 2021  1:18 PM Jenna Newton wrote: Reason for CRM: Patient called in asking if her Dr can please send something to her Pharmacy for her say that her nostril is inflamed and sore to the touch may be swollen on the inside due to her sinus issues. Please advise

## 2021-04-05 NOTE — Telephone Encounter (Signed)
      Message from Jodie Echevaria sent at 04/05/2021  1:22 PM EDT  Patient called in  say that her nostril is inflamed and sore to the touch may be swollen on the inside due to her sinus issues. Please advise Ph# [phone]       Call History   Type Contact Phone/Fax User  04/05/2021 01:21 PM EDT Phone ([address]) [name], [name] (Self) [phone] Lemmie Evens) Jodie Echevaria  04/05/2021 01:20 PM EDT Phone ([address]) [name], [name] (Self) [phone] Lemmie Evens) Jodie Echevaria

## 2021-04-05 NOTE — Telephone Encounter (Signed)
Lvm to make an appt for the dr will request one since has not been seen since 09-2020

## 2021-04-09 ENCOUNTER — Other Ambulatory Visit: Payer: Self-pay

## 2021-04-09 ENCOUNTER — Ambulatory Visit: Payer: BC Managed Care – PPO | Admitting: Family Medicine

## 2021-04-09 ENCOUNTER — Ambulatory Visit: Payer: Self-pay | Admitting: *Deleted

## 2021-04-09 ENCOUNTER — Encounter: Payer: Self-pay | Admitting: Family Medicine

## 2021-04-09 VITALS — BP 120/76 | HR 88 | Temp 99.7°F | Resp 16 | Ht 66.0 in | Wt 123.9 lb

## 2021-04-09 DIAGNOSIS — J329 Chronic sinusitis, unspecified: Secondary | ICD-10-CM | POA: Diagnosis not present

## 2021-04-09 DIAGNOSIS — R220 Localized swelling, mass and lump, head: Secondary | ICD-10-CM | POA: Diagnosis not present

## 2021-04-09 DIAGNOSIS — J3489 Other specified disorders of nose and nasal sinuses: Secondary | ICD-10-CM

## 2021-04-09 MED ORDER — DOXYCYCLINE HYCLATE 100 MG PO TABS: 100.0000 mg | ORAL_TABLET | Freq: Two times a day (BID) | ORAL | 0 refills | Status: AC

## 2021-04-09 MED ORDER — PREDNISONE 20 MG PO TABS: 40.0000 mg | ORAL_TABLET | Freq: Every day | ORAL | 0 refills | Status: DC

## 2021-04-09 NOTE — Telephone Encounter (Signed)
Pt called in c/o her right side of her face being swollen.   She called in on 04/05/2021 and was started on an antibiotic (a sulfur drug per pt) for a sinus infection.   Her right side of her sinuses were already sore and swollen but she feels the antibiotic is making it worse.   Denies her throat being swollen, no shortness of breath or chest tightness.   I instructed her to call 911 if she felt like her throat was closing, the facial swelling was worsening or she developed shortness of breath or chest tightness prior to her appt this morning.   She verbalized understanding.   I made her an appt with Dr. Ky Barban for 11:00 in office visit this morning.   Reason for Disposition  Face swelling began after taking a drug  Answer Assessment - Initial Assessment Questions 1. ONSET: "When did the swelling start?" (e.g., minutes, hours, days)     I'm having swelling on right side of my face.   I was prescribed antibiotic for a sinus infection.   My right face is swollen  big and it hurts to touch.  I think it's the antibiotic making it worse.   I can't blow my nose it hurts.  I called on the 4th and started on the 4th. 2. LOCATION: "What part of the face is swollen?"     My eye, jaw, lip and gums.   I wear dentures. Sulfur drug is what I was given.   I've never had a reaction to any medicine. 3. SEVERITY: "How swollen is it?"     My throat is fine.   No shortness of breath or chest tightness.    My nose is hurting and tingling. 4. ITCHING: "Is there any itching?" If Yes, ask: "How much?"   (Scale 1-10; mild, moderate or severe)     No 5. PAIN: "Is the swelling painful to touch?" If Yes, ask: "How painful is it?"   (Scale 1-10; mild, moderate or severe)   - NONE (0): no pain   - MILD (1-3): doesn't interfere with normal activities    - MODERATE (4-7): interferes with normal activities or awakens from sleep    - SEVERE (8-10): excruciating pain, unable to do any normal activities      Right side of face  hurts a 8 on scale 6. FEVER: "Do you have a fever?" If Yes, ask: "What is it, how was it measured, and when did it start?"      No fever 7. CAUSE: "What do you think is causing the face swelling?"     I think the antibiotic is causing it to be worse. 8. RECURRENT SYMPTOM: "Have you had face swelling before?" If Yes, ask: "When was the last time?" "What happened that time?"     No 9. OTHER SYMPTOMS: "Do you have any other symptoms?" (e.g., toothache, leg swelling)     No 10. PREGNANCY: "Is there any chance you are pregnant?" "When was your last menstrual period?"       Not asked  Protocols used: Face Swelling-A-AH

## 2021-04-09 NOTE — Progress Notes (Signed)
    SUBJECTIVE:   CHIEF COMPLAINT / HPI:   Facial swelling - since 8/3, seen virtually 8/4, started bactrim for sinusitis, swelling got worse after starting bactrim.  - Also with allergy to PCN.  - redness, swelling, itching to face.  - took benadryl this morning ~8:15am. - no trouble breathing, swallowing, fevers - has sore inside R nostril, may have been ulcerated, getting worse.  OBJECTIVE:   BP 120/76   Pulse 88   Temp 99.7 F (37.6 C) (Oral)   Resp 16   Ht 5\' 6"  (1.676 m)   Wt 123 lb 14.4 oz (56.2 kg)   LMP 01/23/2015   BMI 20.00 kg/m   Gen: well appearing, in NAD HEENT: swelling and erythema to face diffusely. Erythematous and enlarged sore to proximal R nostril, no obvious ulcerations. No oral mucosal swelling, oropharynx clear without lesions. Card: RRR Lungs: CTAB   ASSESSMENT/PLAN:   Facial Swelling, Sinusitis Will change antibiotic to doxycycline and provide prednisone burst. May also put antibiotic ointment on nasal sore. RTC if symptoms no better after completing course. Emergency precautions discussed.    Myles Gip, DO

## 2021-04-09 NOTE — Patient Instructions (Signed)
It was great to see you!  Our plans for today:  - We are changing your antibiotic. Take for 7 days. - We are prescribing steroids, you will take this for 5 days.  - If you have trouble breathing, swelling gets worse, go to the Emergency room.   Take care and seek immediate care sooner if you develop any concerns.   Dr. Ky Barban

## 2021-05-08 ENCOUNTER — Emergency Department: Payer: BC Managed Care – PPO

## 2021-05-08 ENCOUNTER — Emergency Department
Admission: EM | Admit: 2021-05-08 | Discharge: 2021-05-08 | Disposition: A | Discharge status: Home / Self Care | Payer: BC Managed Care – PPO | Attending: Emergency Medicine | Admitting: Emergency Medicine

## 2021-05-08 DIAGNOSIS — I10 Essential (primary) hypertension: Secondary | ICD-10-CM | POA: Diagnosis not present

## 2021-05-08 DIAGNOSIS — G40209 Localization-related (focal) (partial) symptomatic epilepsy and epileptic syndromes with complex partial seizures, not intractable, without status epilepticus: Secondary | ICD-10-CM

## 2021-05-08 DIAGNOSIS — R791 Abnormal coagulation profile: Secondary | ICD-10-CM | POA: Diagnosis not present

## 2021-05-08 DIAGNOSIS — F17210 Nicotine dependence, cigarettes, uncomplicated: Secondary | ICD-10-CM | POA: Insufficient documentation

## 2021-05-08 DIAGNOSIS — G939 Disorder of brain, unspecified: Secondary | ICD-10-CM | POA: Diagnosis not present

## 2021-05-08 DIAGNOSIS — R4701 Aphasia: Secondary | ICD-10-CM | POA: Diagnosis not present

## 2021-05-08 DIAGNOSIS — C7931 Secondary malignant neoplasm of brain: Secondary | ICD-10-CM | POA: Diagnosis not present

## 2021-05-08 DIAGNOSIS — R569 Unspecified convulsions: Secondary | ICD-10-CM

## 2021-05-08 DIAGNOSIS — I700 Atherosclerosis of aorta: Secondary | ICD-10-CM | POA: Diagnosis not present

## 2021-05-08 DIAGNOSIS — I2510 Atherosclerotic heart disease of native coronary artery without angina pectoris: Secondary | ICD-10-CM | POA: Diagnosis not present

## 2021-05-08 DIAGNOSIS — Z8669 Personal history of other diseases of the nervous system and sense organs: Secondary | ICD-10-CM | POA: Diagnosis not present

## 2021-05-08 DIAGNOSIS — R918 Other nonspecific abnormal finding of lung field: Secondary | ICD-10-CM | POA: Diagnosis not present

## 2021-05-08 DIAGNOSIS — I672 Cerebral atherosclerosis: Secondary | ICD-10-CM | POA: Diagnosis not present

## 2021-05-08 DIAGNOSIS — D259 Leiomyoma of uterus, unspecified: Secondary | ICD-10-CM | POA: Diagnosis not present

## 2021-05-08 DIAGNOSIS — I639 Cerebral infarction, unspecified: Secondary | ICD-10-CM | POA: Diagnosis not present

## 2021-05-08 DIAGNOSIS — Z79899 Other long term (current) drug therapy: Secondary | ICD-10-CM | POA: Diagnosis not present

## 2021-05-08 DIAGNOSIS — R4182 Altered mental status, unspecified: Secondary | ICD-10-CM | POA: Diagnosis not present

## 2021-05-08 DIAGNOSIS — C801 Malignant (primary) neoplasm, unspecified: Secondary | ICD-10-CM

## 2021-05-08 DIAGNOSIS — G9389 Other specified disorders of brain: Secondary | ICD-10-CM | POA: Diagnosis not present

## 2021-05-08 DIAGNOSIS — J432 Centrilobular emphysema: Secondary | ICD-10-CM | POA: Diagnosis not present

## 2021-05-08 DIAGNOSIS — R2990 Unspecified symptoms and signs involving the nervous system: Secondary | ICD-10-CM

## 2021-05-08 LAB — CBC
HCT: 40.9 % (ref 36.0–46.0)
Hemoglobin: 13.9 g/dL (ref 12.0–15.0)
MCH: 29.2 pg (ref 26.0–34.0)
MCHC: 34 g/dL (ref 30.0–36.0)
MCV: 85.9 fL (ref 80.0–100.0)
Platelets: 284 10*3/uL (ref 150–400)
RBC: 4.76 MIL/uL (ref 3.87–5.11)
RDW: 16.2 % — ABNORMAL HIGH (ref 11.5–15.5)
WBC: 7.5 10*3/uL (ref 4.0–10.5)
nRBC: 0 % (ref 0.0–0.2)

## 2021-05-08 LAB — COMPREHENSIVE METABOLIC PANEL
ALT: 30 U/L (ref 0–44)
AST: 35 U/L (ref 15–41)
Albumin: 4.4 g/dL (ref 3.5–5.0)
Alkaline Phosphatase: 98 U/L (ref 38–126)
Anion gap: 11 (ref 5–15)
BUN: 13 mg/dL (ref 6–20)
CO2: 25 mmol/L (ref 22–32)
Calcium: 9.4 mg/dL (ref 8.9–10.3)
Chloride: 99 mmol/L (ref 98–111)
Creatinine, Ser: 0.45 mg/dL (ref 0.44–1.00)
GFR, Estimated: >60 mL/min (ref >60)
Glucose, Bld: 114 mg/dL — ABNORMAL HIGH (ref 70–99)
Potassium: 4.4 mmol/L (ref 3.5–5.1)
Sodium: 135 mmol/L (ref 135–145)
Total Bilirubin: 1.2 mg/dL (ref 0.3–1.2)
Total Protein: 8.1 g/dL (ref 6.5–8.1)

## 2021-05-08 LAB — CBG MONITORING, ED
Glucose-Capillary: 131 mg/dL — ABNORMAL HIGH (ref 70–99)
Glucose-Capillary: 138 mg/dL — ABNORMAL HIGH (ref 70–99)

## 2021-05-08 LAB — DIFFERENTIAL
Abs Immature Granulocytes: 0.03 10*3/uL (ref 0.00–0.07)
Basophils Absolute: 0 10*3/uL (ref 0.0–0.1)
Basophils Relative: 1 %
Eosinophils Absolute: 0.4 10*3/uL (ref 0.0–0.5)
Eosinophils Relative: 5 %
Immature Granulocytes: 0 %
Lymphocytes Relative: 29 %
Lymphs Abs: 2.1 10*3/uL (ref 0.7–4.0)
Monocytes Absolute: 0.4 10*3/uL (ref 0.1–1.0)
Monocytes Relative: 6 %
Neutro Abs: 4.5 10*3/uL (ref 1.7–7.7)
Neutrophils Relative %: 59 %

## 2021-05-08 LAB — ETHANOL: Alcohol, Ethyl (B): <10 mg/dL (ref <10)

## 2021-05-08 LAB — APTT: aPTT: 29 seconds (ref 24–36)

## 2021-05-08 LAB — PROTIME-INR
INR: 1 (ref 0.8–1.2)
Prothrombin Time: 13 seconds (ref 11.4–15.2)

## 2021-05-08 MED ORDER — NICOTINE 21 MG/24HR TD PT24
21.0000 mg | MEDICATED_PATCH | Freq: Once | TRANSDERMAL | Status: DC
  Administered 2021-05-08: 21 mg via TRANSDERMAL
  Filled 2021-05-08: qty 1

## 2021-05-08 MED ORDER — IOHEXOL 240 MG/ML SOLN: 30.0000 mL | Freq: Once | INTRAMUSCULAR | Status: DC | PRN

## 2021-05-08 MED ORDER — IOHEXOL 350 MG/ML SOLN
80.0000 mL | Freq: Once | INTRAVENOUS | Status: AC | PRN
  Administered 2021-05-08: 75 mL via INTRAVENOUS

## 2021-05-08 MED ORDER — LEVETIRACETAM 500 MG PO TABS: 500.0000 mg | ORAL_TABLET | Freq: Two times a day (BID) | ORAL | Status: DC

## 2021-05-08 MED ORDER — DEXAMETHASONE 4 MG PO TABS: ORAL_TABLET | ORAL | 0 refills | Status: DC

## 2021-05-08 MED ORDER — SODIUM CHLORIDE 0.9 % IV SOLN: Freq: Once | INTRAVENOUS | Status: DC

## 2021-05-08 MED ORDER — GADOBUTROL 1 MMOL/ML IV SOLN
6.0000 mL | Freq: Once | INTRAVENOUS | Status: AC | PRN
  Administered 2021-05-08: 6 mL via INTRAVENOUS

## 2021-05-08 MED ORDER — LEVETIRACETAM IN NACL 1000 MG/100ML IV SOLN
1000.0000 mg | Freq: Once | INTRAVENOUS | Status: AC
  Administered 2021-05-08: 1000 mg via INTRAVENOUS
  Filled 2021-05-08: qty 100

## 2021-05-08 MED ORDER — SODIUM CHLORIDE 0.9% FLUSH
3.0000 mL | Freq: Once | INTRAVENOUS | Status: DC
Start: 2021-05-08 — End: 2021-05-08

## 2021-05-08 MED ORDER — DEXAMETHASONE SODIUM PHOSPHATE 10 MG/ML IJ SOLN
10.0000 mg | Freq: Once | INTRAMUSCULAR | Status: AC
  Administered 2021-05-08: 10 mg via INTRAVENOUS
  Filled 2021-05-08 (×2): qty 1

## 2021-05-08 MED ORDER — LEVETIRACETAM 500 MG PO TABS: 500.0000 mg | ORAL_TABLET | Freq: Two times a day (BID) | ORAL | 0 refills | Status: DC

## 2021-05-08 MED ORDER — LORAZEPAM 2 MG/ML IJ SOLN
1.0000 mg | Freq: Once | INTRAMUSCULAR | Status: AC
  Administered 2021-05-08: 1 mg via INTRAVENOUS
  Filled 2021-05-08: qty 1

## 2021-05-08 NOTE — ED Provider Notes (Signed)
[facility] Emergency Department Provider Note  ____________________________________________   Event Date/Time   First MD Initiated Contact with Patient 05/08/21 1301     (approximate)  I have reviewed the triage vital signs and the nursing notes.   HISTORY  Chief Complaint Altered Mental Status    HPI Jenna Newton is a 55 y.o. female  with pmhx anemia, GERD, alcohol abuse, here with expressive aphasia. Pt was reportedly last normal around 10:40 AM. She had gone to work with her brother and had been normal this AM.  He received a call at around 11:15 AM stating that she had been found and was not making any sense in regards to her speech and language processing. She arrives confused, unable to provide additional history due to aphasia. Shakes her head no to any pain, weakness, numbness. Follows commands.  Level 5 caveat invoked as remainder of history, ROS, and physical exam limited due to patient's aphasia.        Past Medical History:  Diagnosis Date   Anemia    GERD (gastroesophageal reflux disease)    Neuropathy    Tobacco abuse 03/14/2017   Vertigo    White matter disease of brain due to ischemia 12/04/2017   Head CT March 2019    Patient Active Problem List   Diagnosis Date Noted   White matter disease of brain due to ischemia 12/04/2017   Alcohol use 12/04/2017   Medication monitoring encounter 04/18/2017   Benign hypertension 04/18/2017   Dyslipidemia 04/18/2017   Dry mouth 03/14/2017   Neuropathy 03/14/2017   Tobacco abuse 03/14/2017   Mononeuritis 05/12/2014   Numbness of lower extremity 05/12/2014   Right foot drop 05/12/2014   Weakness 05/12/2014    Past Surgical History:  Procedure Laterality Date   none      Prior to Admission medications   Medication Sig Start Date End Date Taking? Authorizing Provider  amLODipine (NORVASC) 5 MG tablet Take 1 tablet (5 mg total) by mouth daily. 09/11/20  Yes Delsa Grana, PA-C   atorvastatin (LIPITOR) 40 MG tablet Take 1 tablet (40 mg total) by mouth at bedtime. For cholesterol; this replaces lovastastin 09/11/20  Yes Tapia, Leisa, PA-C  dexamethasone (DECADRON) 4 MG tablet Take one tablet twice a day for 1 week, or until followed up with Oncology. If you cannot follow-up, decrease to 1 tablet daily for 3 days, then stop. 05/08/21  Yes Duffy Bruce, MD  fluticasone (FLONASE) 50 MCG/ACT nasal spray Place into both nostrils. 11/30/20  Yes [provider]  levETIRAcetam (KEPPRA) 500 MG tablet Take 1 tablet (500 mg total) by mouth 2 (two) times daily. 05/08/21 07/07/21 Yes Duffy Bruce, MD  levocetirizine (XYZAL) 5 MG tablet Take 1 tablet (5 mg total) by mouth every evening. 09/11/20  Yes Delsa Grana, PA-C  meclizine (ANTIVERT) 25 MG tablet Take 1 tablet (25 mg total) by mouth 3 (three) times daily as needed for dizziness (vertigo). 09/11/20  Yes Delsa Grana, PA-C  montelukast (SINGULAIR) 10 MG tablet Take 1 tablet (10 mg total) by mouth at bedtime. 09/11/20  Yes Delsa Grana, PA-C  triamcinolone (NASACORT) 55 MCG/ACT AERO nasal inhaler Place 2 sprays into the nose daily. 12/06/19  Yes Delsa Grana, PA-C  vitamin B-12 (CYANOCOBALAMIN) 500 MCG tablet Take 1 tablet (500 mcg total) by mouth daily. 09/11/20  Yes Delsa Grana, PA-C  cromolyn (OPTICROM) 4 % ophthalmic solution Place 1 drop into both eyes 4 (four) times daily as needed. 12/01/20   Delsa Grana,  PA-C  saline (AYR) GEL Place 1 application into the nose every 4 (four) hours. 12/06/19   Delsa Grana, PA-C    Allergies Penicillins, Meclizine, and Sulfa antibiotics  Family History  Problem Relation Age of Onset   Diabetes Mother    Hypertension Mother    Emphysema Mother    Cancer Father    Diabetes Maternal Grandmother    Hypertension Maternal Grandmother    Blindness Maternal Grandmother    Cancer Paternal Grandmother    Cancer Paternal Grandfather    Diabetes Brother    Alcohol abuse Brother    Cancer  Maternal Grandfather        prostate   Emphysema Maternal Grandfather     Social History Social History   Tobacco Use   Smoking status: Every Day    Packs/day: 0.50    Years: 25.00    Pack years: 12.50    Types: Cigarettes   Smokeless tobacco: Never  Vaping Use   Vaping Use: Never used  Substance Use Topics   Alcohol use: Yes    Alcohol/week: 14.0 standard drinks    Types: 14 Cans of beer per week   Drug use: No    Review of Systems  Review of Systems  Unable to perform ROS: Mental status change  Neurological:  Positive for speech difficulty.  Psychiatric/Behavioral:  Positive for confusion.     ____________________________________________  PHYSICAL EXAM:      VITAL SIGNS: ED Triage Vitals [05/08/21 1246]  Enc Vitals Group     BP (!) 159/92     Pulse Rate 98     Resp 18     Temp      Temp src      SpO2 97 %     Weight      Height      Head Circumference      Peak Flow      Pain Score      Pain Loc      Pain Edu?      Excl. in [place]?      Physical Exam Vitals and nursing note reviewed.  Constitutional:      General: She is not in acute distress.    Appearance: She is well-developed.  HENT:     Head: Normocephalic and atraumatic.  Eyes:     Conjunctiva/sclera: Conjunctivae normal.  Cardiovascular:     Rate and Rhythm: Normal rate and regular rhythm.     Heart sounds: Normal heart sounds. No murmur heard.   No friction rub.  Pulmonary:     Effort: Pulmonary effort is normal. No respiratory distress.     Breath sounds: Normal breath sounds. No wheezing or rales.  Abdominal:     General: There is no distension.     Palpations: Abdomen is soft.     Tenderness: There is no abdominal tenderness.  Musculoskeletal:     Cervical back: Neck supple.  Skin:    General: Skin is warm.     Capillary Refill: Capillary refill takes less than 2 seconds.  Neurological:     Mental Status: She is alert and oriented to person, place, and time.     Motor: No  abnormal muscle tone.     Comments: CNII-XII intact. Strength 5/5 bl UE and LE. Normal sensation to light touch. Expressive aphasia noted with intermittent word errors, though receptive and follows commands.      ____________________________________________   LABS (all labs ordered are listed, but only abnormal results are  displayed)  Labs Reviewed  CBC - Abnormal; Notable for the following components:      Result Value   RDW 16.2 (*)    All other components within normal limits  COMPREHENSIVE METABOLIC PANEL - Abnormal; Notable for the following components:   Glucose, Bld 114 (*)    All other components within normal limits  CBG MONITORING, ED - Abnormal; Notable for the following components:   Glucose-Capillary 138 (*)    All other components within normal limits  CBG MONITORING, ED - Abnormal; Notable for the following components:   Glucose-Capillary 131 (*)    All other components within normal limits  PROTIME-INR  APTT  DIFFERENTIAL  ETHANOL    ____________________________________________  EKG: Normal sinus rhythm, VR 100. QRS 120. No acute St elevations or depressions. No ischemia or infarct. ________________________________________  RADIOLOGY All imaging, including plain films, CT scans, and ultrasounds, independently reviewed by me, and interpretations confirmed via formal radiology reads.  ED MD interpretation:   CT Head: Small ill defined hyperdensity in R corona radiata, L parietal lobe, R frontal matter MR Head: multple small peripherally enhancing lesions, likely metastatic, less likely septic   Official radiology report(s): MR Brain W and Wo Contrast  Result Date: 05/08/2021 CLINICAL DATA:  Brain mass or lesion EXAM: MRI HEAD WITHOUT AND WITH CONTRAST TECHNIQUE: Multiplanar, multiecho pulse sequences of the brain and surrounding structures were obtained without and with intravenous contrast. CONTRAST:  70mL GADAVIST GADOBUTROL 1 MMOL/ML IV SOLN COMPARISON:  CT  head earlier same day FINDINGS: Brain: Multiple (greater than 10) peripherally enhancing lesions are identified in bilateral cerebral hemispheres. No definite involvement of brainstem or cerebellum. Largest in the right corona radiata measures 1 cm and corresponds to one of the hyperdense lesions seen on CT. There is diffusion hyperintensity at the periphery. Mild edema associated with some of the lesions. There is no acute infarction or intracranial hemorrhage. No significant mass effect. No hydrocephalus or extra-axial collection. Vascular: Major vessel flow voids at the skull base are preserved. Skull and upper cervical spine: Normal marrow signal is preserved. Sinuses/Orbits: Paranasal sinuses are aerated. Orbits are unremarkable. Other: Sella is unremarkable.  Mastoid air cells are clear. IMPRESSION: Multiple small peripherally enhancing lesions. Primary differential consideration is metastatic disease. Septic emboli is another consideration in the appropriate setting. Electronically Signed   By: Macy Mis M.D.   On: 05/08/2021 15:13   CT CHEST ABDOMEN PELVIS W CONTRAST  Result Date: 05/08/2021 CLINICAL DATA:  Cancer of unknown primary. Patient presents with multiple brain lesions. EXAM: CT CHEST, ABDOMEN, AND PELVIS WITH CONTRAST TECHNIQUE: Multidetector CT imaging of the chest, abdomen and pelvis was performed following the standard protocol during bolus administration of intravenous contrast. CONTRAST:  65mL OMNIPAQUE IOHEXOL 350 MG/ML SOLN COMPARISON:  None. FINDINGS: CT CHEST FINDINGS Cardiovascular: Heart size is normal. Aortic atherosclerosis. No pericardial effusion. Coronary artery calcifications. Mediastinum/Nodes: Normal appearance of the thyroid gland. The trachea appears patent and is midline. Normal appearance of the esophagus. No enlarged supraclavicular or axillary lymph nodes. Prominent left paratracheal lymph node measures 1.1 cm, image 23/2. Prominent right paratracheal lymph node  measures 1 cm, image 23/2. Enlarged left hilar lymph nodes are identified measuring up to 1.3 cm, image 61/4, image 26/2 and image 24/2 Lungs/Pleura: No pleural effusion. Centrilobular emphysema. Elongated mass within the posterior left upper lobe extends from the left hilum. This is best seen on the sagittal images where the mass measures 3.2 cm in length with a maximum diameter of 1.2  cm, image 107/5. No other pulmonary nodule or mass identified. Musculoskeletal: No chest wall mass or suspicious bone lesions identified. CT ABDOMEN PELVIS FINDINGS Hepatobiliary: No focal liver abnormality is seen. No gallstones, gallbladder wall thickening, or biliary dilatation. Pancreas: Unremarkable. No pancreatic ductal dilatation or surrounding inflammatory changes. Spleen: Normal in size without focal abnormality. Adrenals/Urinary Tract: Bilateral adrenal nodules are noted in appears similar to study from 12/17/2013. The largest is on the right measuring 2.4 x 1.9 cm, image 61/2. On 12/17/2013 this measured 2.5 x 1.7 cm. No kidney mass or hydronephrosis. Bladder unremarkable. Stomach/Bowel: Stomach is within normal limits. No evidence of bowel wall thickening, distention, or inflammatory changes. Vascular/Lymphatic: Aortic atherosclerosis. No aneurysm. No abdominopelvic adenopathy. Reproductive: Enlarged partially calcified fibroid uterus is identified which measures 10.4 by 9.8 x 9.5 cm. No adnexal mass identified. Other: No free fluid or fluid collections. Musculoskeletal: No acute or significant osseous findings. IMPRESSION: 1. 3.2 cm posterior left upper lobe lung mass extends from the left hilum and is concerning for primary bronchogenic carcinoma. Further evaluation with PET-CT and tissue sampling advised. 2. Enlarged left hilar and prominent bilateral paratracheal lymph nodes are identified. Worrisome for nodal metastasis. 3. Similar appearance of bilateral adrenal nodules compared with 12/17/2013. Favor benign  adenomas. 4. Coronary artery calcifications noted. 5. Enlarged fibroid uterus. Aortic Atherosclerosis (ICD10-I70.0) and Emphysema (ICD10-J43.9). Electronically Signed   By: Kerby Moors M.D.   On: 05/08/2021 16:55   CT HEAD CODE STROKE WO CONTRAST  Result Date: 05/08/2021 CLINICAL DATA:  Code stroke. EXAM: CT HEAD WITHOUT CONTRAST TECHNIQUE: Contiguous axial images were obtained from the base of the skull through the vertex without intravenous contrast. COMPARISON:  2019 FINDINGS: Brain: There is small area of ill-defined hyperdensity in the right corona radiata (series 4, image 16). Additional small areas present in the left parietal lobe involving cortex (image 23). Possible small area in the anterior right frontal white matter (image 13). Small area of low attenuation in the left frontal subcortical white matter could reflect chronic microvascular ischemic change or edema. Gray-white differentiation is preserved. Vascular: No hyperdense vessel. Intracranial atherosclerotic calcification at the skull base. Skull: Unremarkable. Sinuses/Orbits: No acute abnormality. Other: Mastoid air cells are clear. ASPECTS ([place] Stroke Program Early CT Score) - Ganglionic level infarction (caudate, lentiform nuclei, internal capsule, insula, M1-M3 cortex): 7 - Supraganglionic infarction (M4-M6 cortex): 3 Total score (0-10 with 10 being normal): 10 IMPRESSION: Small ill-defined hyperdensity in the right corona radiata, left parietal lobe, and possibly in the anterior right frontal white matter. Small areas of acute hemorrhage are possible, but MRI is recommended to evaluate for underlying lesions. No evidence of acute infarction. These results were called by telephone at the time of interpretation on 05/08/2021 at 1:04 pm to provider PHILLIP STAFFORD , who verbally acknowledged these results. Electronically Signed   By: Macy Mis M.D.   On: 05/08/2021 13:11     ____________________________________________  PROCEDURES   Procedure(s) performed (including Critical Care):  Procedures  ____________________________________________  INITIAL IMPRESSION / MDM / [place] / ED COURSE  As part of my medical decision making, I reviewed the following data within the [place] notes reviewed and incorporated, Old chart reviewed, Notes from prior ED visits, and [place] Controlled Substance Database       *Jenna Newton was evaluated in Emergency Department on 05/09/2021 for the symptoms described in the history of present illness. She was evaluated in the context of the global COVID-19 pandemic, which necessitated consideration  that the patient might be at risk for infection with the SARS-CoV-2 virus that causes COVID-19. Institutional protocols and algorithms that pertain to the evaluation of patients at risk for COVID-19 are in a state of rapid change based on information released by regulatory bodies including the CDC and federal and state organizations. These policies and algorithms were followed during the patient's care in the ED.  Some ED evaluations and interventions may be delayed as a result of limited staffing during the pandemic.*     Medical Decision Making:  55 yo F here with acute onset altered mental status, speech difficulty. Pt initially activated as a CODE STROKE and taken stat to CT scanner, which shows multiple lesions/bleeds concerning for metastatic disease. Neuro at bedside, recommends MR, Keppra, admission. Labs overall unremarkable. Plt normal. No anticoagulation use. Pt initially resistant to MRI but convinced after discussing indications/concerns. MRI read as concerning for metastatic disease (unlikely septic emboli in absence of any fevers, leukocytosis, infectious sx). Neuro at bedside to discuss, pt remains somewhat post-ictal but is again refusing admission.  I had a long discussion with pt, family,  and friend. She refuses admission. She was, however, amenable to obtaining CT scan in ED so CT C/A/P obtained, reviewed as above. Unfortunately, seems pt likely has primary bronchogenic CA with mets. I sat down and discussed this with pt, family, and friend. Pt is now awake, alert, oriented x 4. She is able to tell me why she is here, her diagnosis, and why I recommend admission for obs/work-up. She refuses to stay. I called and discussed with Dr. Tasia Catchings to help arrange f/u. Will start on keppra BID, decadron, and arrange close f/u as outpt. Pt updated and in agreement. Signed out AMA.  ____________________________________________  FINAL CLINICAL IMPRESSION(S) / ED DIAGNOSES  Final diagnoses:  Stroke-like symptoms  Brain metastases (HCC)  Lung mass  Cancer ([place])  Seizure ([place])     MEDICATIONS GIVEN DURING THIS VISIT:  Medications  levETIRAcetam (KEPPRA) IVPB 1000 mg/100 mL premix (0 mg Intravenous Stopped 05/08/21 1427)  LORazepam (ATIVAN) injection 1 mg (1 mg Intravenous Given 05/08/21 1400)  gadobutrol (GADAVIST) 1 MMOL/ML injection 6 mL (6 mLs Intravenous Contrast Given 05/08/21 1458)  dexamethasone (DECADRON) injection 10 mg (10 mg Intravenous Given 05/08/21 1648)  iohexol (OMNIPAQUE) 350 MG/ML injection 80 mL (75 mLs Intravenous Contrast Given 05/08/21 1611)     ED Discharge Orders          Ordered    levETIRAcetam (KEPPRA) 500 MG tablet  2 times daily        05/08/21 1705    dexamethasone (DECADRON) 4 MG tablet        05/08/21 1705             Note:  This document was prepared using Dragon voice recognition software and may include unintentional dictation errors.   Duffy Bruce, MD 05/09/21 [phone]

## 2021-05-08 NOTE — ED Notes (Signed)
Code  stroke  called  to  [facility]

## 2021-05-08 NOTE — ED Triage Notes (Signed)
Pt presents to ED via POV with c/o of pt having AMS that started this afternoon. Per brother pt was normal this morning. Pt not able to tell this RN last name or what a marker is.   Last known well 1040 this morning.

## 2021-05-08 NOTE — ED Notes (Signed)
CBG 138

## 2021-05-08 NOTE — Consult Note (Signed)
Neurology Consultation Reason for Consult: Aphasia Requesting Physician: Duffy Bruce   CC: Aphasia  History is obtained from: Largely from brother at bedside, chart review as well as briefly from patient  HPI: Jenna Newton is a 55 y.o. female with a past medical history significant for hypertension, hyperlipidemia, right foot drop, daily alcohol use, tobacco use.  She and her brother reports she has been in her normal state of health lately and he had last talked to her at 7:20 in the morning when they got to work.  He received a call at 11:15 AM or so stating she had been found and was not making any sense with regards to her speech and language processing.  He decided to bring her to [facility] for further evaluation by private vehicle.  Initially when he found her she was not able to state her name but she was by the time of my evaluation although she remains significantly aphasic  On chart review, she was seen by video visit on 8/4 for concern for acute rhinitis, with an erythematous and enlarged sore to the proximal right nostril noted without obvious ulcerations mucosal swelling and oropharynx without lesions on 8/8.  She was initially treated with Bactrim but then changed to doxycycline as well as prednisone with recommendations to treat the nasal sore with an antibiotic ointment.  She reports that the bleeding stopped but she has continued to have a knot in the right nare that has been stable.  She denies any other skin lesions or conditions any fevers, sweats, chills, loss of weight or other systemic symptoms to me, but is also unable to explain in her own words the reason I am concerned about her MRI brain scan and the additional work-up that I recommend she be admitted for.  LKW: 7:20 a.m. 9/5 tPA given?: No, multifocal hyperdensities on head CT concerning for mets versus bleeds Premorbid modified rankin scale:      0 - No symptoms.  ROS: Unable to obtain due to altered mental status    Past Medical History:  Diagnosis Date   Anemia    GERD (gastroesophageal reflux disease)    Neuropathy    Tobacco abuse 03/14/2017   Vertigo    White matter disease of brain due to ischemia 12/04/2017   Head CT March 2019   Past Surgical History:  Procedure Laterality Date   none      Current Facility-Administered Medications:    iohexol (OMNIPAQUE) 240 MG/ML injection 30 mL, 30 mL, Oral, Once PRN, Timothee Gali L, MD   nicotine (NICODERM CQ - dosed in mg/24 hours) patch 21 mg, 21 mg, Transdermal, Once, Duffy Bruce, MD, 21 mg at 05/08/21 1355   sodium chloride flush (NS) 0.9 % injection 3 mL, 3 mL, Intravenous, Once, Carrie Mew, MD  Current Outpatient Medications:    amLODipine (NORVASC) 5 MG tablet, Take 1 tablet (5 mg total) by mouth daily., Disp: 90 tablet, Rfl: 3   atorvastatin (LIPITOR) 40 MG tablet, Take 1 tablet (40 mg total) by mouth at bedtime. For cholesterol; this replaces lovastastin, Disp: 90 tablet, Rfl: 3   fluticasone (FLONASE) 50 MCG/ACT nasal spray, Place into both nostrils., Disp: , Rfl:    levocetirizine (XYZAL) 5 MG tablet, Take 1 tablet (5 mg total) by mouth every evening., Disp: 30 tablet, Rfl: 5   meclizine (ANTIVERT) 25 MG tablet, Take 1 tablet (25 mg total) by mouth 3 (three) times daily as needed for dizziness (vertigo)., Disp: 90 tablet, Rfl: 11  montelukast (SINGULAIR) 10 MG tablet, Take 1 tablet (10 mg total) by mouth at bedtime., Disp: 90 tablet, Rfl: 3   triamcinolone (NASACORT) 55 MCG/ACT AERO nasal inhaler, Place 2 sprays into the nose daily., Disp: 1 Inhaler, Rfl: 12   vitamin B-12 (CYANOCOBALAMIN) 500 MCG tablet, Take 1 tablet (500 mcg total) by mouth daily., Disp: 100 tablet, Rfl: 3   cromolyn (OPTICROM) 4 % ophthalmic solution, Place 1 drop into both eyes 4 (four) times daily as needed., Disp: 10 mL, Rfl: 3   predniSONE (DELTASONE) 20 MG tablet, Take 2 tablets (40 mg total) by mouth daily. (Patient not taking: No sig reported), Disp:  10 tablet, Rfl: 0   saline (AYR) GEL, Place 1 application into the nose every 4 (four) hours., Disp: 14.1 g, Rfl: 3   Family History  Problem Relation Age of Onset   Diabetes Mother    Hypertension Mother    Emphysema Mother    Cancer Father    Diabetes Maternal Grandmother    Hypertension Maternal Grandmother    Blindness Maternal Grandmother    Cancer Paternal Grandmother    Cancer Paternal Grandfather    Diabetes Brother    Alcohol abuse Brother    Cancer Maternal Grandfather        prostate   Emphysema Maternal Grandfather     Social History:  reports that she has been smoking cigarettes. She has a 12.50 pack-year smoking history. She has never used smokeless tobacco. She reports current alcohol use of about 14.0 standard drinks per week. She reports that she does not use drugs.  Exam: Current vital signs: BP (!) 167/82   Pulse (!) 102   Resp 15   LMP 01/23/2015   SpO2 93%  Vital signs in last 24 hours: Pulse Rate:  [98-102] 102 (09/06 1330) Resp:  [15-18] 15 (09/06 1330) BP: (159-167)/(82-92) 167/82 (09/06 1330) SpO2:  [93 %-97 %] 93 % (09/06 1330)   Physical Exam  Constitutional: Appears well-developed and well-nourished.  Thin Psych: Affect irritable, does tear up when I explained my concern for malignancy, but perseverating on desire to go home Eyes: No scleral injection HENT: No oropharyngeal obstruction.  Small knot in the right nare without ulceration at this time.  No perioral lesions. MSK: no joint deformities.  Cardiovascular: Normal rate and regular rhythm.  Respiratory: Effort normal, non-labored breathing GI: Soft.  No distension. There is no tenderness.  Skin: Warm dry and intact visible skin.  No Janeway's lesions or Osler nodes on the bilateral hands, feet not examined as patient had shoes on and was not willing to stay  Neuro: Mental Status: On initial evaluation patient was able to follow some simple commands and produce some simple speech but  was markedly aphasic.  On later reevaluation she was able to name, repeat, corroborate key details of her history and she and her brother both reported her speech was back to her baseline Cranial Nerves: II: Visual Fields are full. Pupils are equal, round, and reactive to light.   III,IV, VI: EOMI without ptosis or diploplia.  V: Facial sensation is symmetric to temperature VII: Facial movement is symmetric.  VIII: hearing is intact to voice X: Uvula elevates symmetrically XI: Shoulder shrug is symmetric. XII: tongue is midline without atrophy or fasciculations.  Motor: Tone is normal. Bulk is normal.  On initial evaluation mild pronation without drift of the bilateral upper extremities, no leg drift bilaterally.  However she was too aphasic to perform confrontational testing Sensory: She was  grossly equally reactive to light touch in all 4 extremities Reflexes: Deferred by patient Cerebellar: Finger-nose and heel-to-shin were difficult to assess in the setting of her aphasia and then deferred by patient  NIHSS total 3  Score breakdown: 2 points for being unable to state month or age, one-point for mild to moderate aphasia    I have reviewed labs in epic and the results pertinent to this consultation are: Mildly elevated glucose at 114, mildly increased RDW at 16.2, otherwise CBC and CMP are within normal limits Ethanol level undetectable  I have reviewed the images obtained:  Head CT w/ multifocal hyperdensities -- differential includes brain masses/lesions, hemorrhages,  MRI brain w/ and w/o demonstrating multiple diffusion restriction lesions, peripherally enhancing highly concerning for metastatic disease.  Radiology notes potential differential of septic emboli; clinical history and presentation do not support this  Impression: Likely metastatic brain disease with focal seizure from a left temporal lesion.  Patient's aphasia is rapidly resolving as expected for postictal Todd's  phenomenon.  Recommendations: -Given 1 g of Keppra IV on my recommendation, continue 500 mg twice daily -Seizure precautions need to be fully reviewed with patient and family, as detailed below.  They should be included in discharge instructions.  I did preliminarily discuss these with the patient that she was not able to explain to me that she understood  -CT chest abdomen pelvis with contrast for malignancy screening to assess for primary site -UA, UDS pending -Do not feel there is need for steroids at this time given minimal edema on imaging, please hold further dexamethasone -Management of chronic alcohol use and other comorbidities per primary team -At this time, patient is improving but did not have full capacity to leave [place], though she may recover this capacity as her postictal Todd's aphasia resolves -Discussed with ED provider and continue to recommend admission for further work-up as above, additionally discussed with brother at bedside -Neurology will plan to continue to follow  Standard seizure precautions: Per [facility] statutes, patients with seizures are not allowed to drive until  they have been seizure-free for six months. Use caution when using heavy equipment or power tools. Avoid working on ladders or at heights. Take showers instead of baths. Ensure the water temperature is not too high on the home water heater. Do not go swimming alone. When caring for infants or small children, sit down when holding, feeding, or changing them to minimize risk of injury to the child in the event you have a seizure.  To reduce risk of seizures, maintain good sleep hygiene avoid alcohol and illicit drug use, take all anti-seizure medications as prescribed.   Lesleigh Noe MD-PhD [facility] [phone] [facility] coverage for [facility] is from 8 AM to 4 AM in-house and 4 PM to 8 PM by telephone/video. 8 PM to 8 AM emergent questions or  overnight urgent questions should be addressed to Teleneurology On-call or [facility] neurohospitalist; contact information can be found on AMION

## 2021-05-08 NOTE — Progress Notes (Signed)
CODE STROKE- PHARMACY COMMUNICATION   Time CODE STROKE called/page received:1311  Time response to CODE STROKE was made (in person or via phone): immediately  Time Stroke Kit retrieved from [place] (only if needed): N/a, no TNK per neurologist  Name of Provider/Nurse contacted:Dr. Bhagat  Past Medical History:  Diagnosis Date   Anemia    GERD (gastroesophageal reflux disease)    Neuropathy    Tobacco abuse 03/14/2017   Vertigo    White matter disease of brain due to ischemia 12/04/2017   Head CT March 2019     Wynelle Cleveland, PharmD Pharmacy Resident  05/08/2021 3:26 PM

## 2021-05-08 NOTE — ED Notes (Signed)
pts belongings taken by pts brother.

## 2021-05-08 NOTE — Discharge Instructions (Addendum)
As we discussed, your symptoms today are likely due to brain metastases, or cancer, which has likely spread from your left lung.  We highly recommend that you are admitted, but understand if you refused to do so.  For now, start the [place] which is an antiseizure medicine twice a day.  You cannot drive, by [facility], until seizure-free and cleared by your doctor.  Take the steroid twice a day to help with swelling around the brain masses and with symptoms.  Call oncology, at the number provided, tomorrow, to set up urgent follow-up this week.

## 2021-05-08 NOTE — Consult Note (Signed)
Arrived to patient before a code stroke was called, pt was present in room with family and primary nurse. Pt able to follow commands, unable to accurately answer questions of month and age. Neurology arrives to bedside and it was determined that pt was last seen well at 0720 this am when her brother spoke to her before work, brother received a call at work stating they needed him to check on his sister bc she was speaking but not making sense. Pt went to CT and per neuro suggestions pt not a candidate for TNK. Pt will be ordered to have MRI.

## 2021-05-08 NOTE — ED Notes (Signed)
Pt was ready to leave and did not want to sign discharges, wheeled pt to lobby for pickup from sister. Pt was contacting sister via courtesy phone for pickup. Pt did not want to stay in wheelchair per request and was ambulating. First nurse aware of pt in lobby waiting for discharge.

## 2021-05-09 ENCOUNTER — Encounter: Payer: Self-pay | Admitting: Family Medicine

## 2021-05-09 ENCOUNTER — Telehealth: Payer: Self-pay | Admitting: *Deleted

## 2021-05-09 ENCOUNTER — Telehealth: Payer: Self-pay | Admitting: Oncology

## 2021-05-09 NOTE — Telephone Encounter (Signed)
Patient called asking for a note to return to work. I explained to her that doctor will not give a note to someone she has never seen before. Patient has a new patient appointment 05/14/21. She asked if she can get her appointment moved to today. I transferred her to [facility] to new patient coordinator

## 2021-05-09 NOTE — Telephone Encounter (Signed)
error 

## 2021-05-14 ENCOUNTER — Telehealth: Payer: Self-pay | Admitting: Pulmonary Disease

## 2021-05-14 ENCOUNTER — Inpatient Hospital Stay: Payer: BC Managed Care – PPO | Attending: Oncology | Admitting: Oncology

## 2021-05-14 ENCOUNTER — Encounter: Payer: Self-pay | Admitting: Oncology

## 2021-05-14 ENCOUNTER — Inpatient Hospital Stay: Payer: BC Managed Care – PPO

## 2021-05-14 VITALS — BP 147/87 | HR 78 | Temp 98.4°F | Resp 18 | Wt 121.7 lb

## 2021-05-14 DIAGNOSIS — C7931 Secondary malignant neoplasm of brain: Secondary | ICD-10-CM | POA: Diagnosis not present

## 2021-05-14 DIAGNOSIS — R569 Unspecified convulsions: Secondary | ICD-10-CM | POA: Insufficient documentation

## 2021-05-14 DIAGNOSIS — C3412 Malignant neoplasm of upper lobe, left bronchus or lung: Secondary | ICD-10-CM | POA: Insufficient documentation

## 2021-05-14 DIAGNOSIS — G939 Disorder of brain, unspecified: Secondary | ICD-10-CM

## 2021-05-14 DIAGNOSIS — Z7952 Long term (current) use of systemic steroids: Secondary | ICD-10-CM | POA: Insufficient documentation

## 2021-05-14 DIAGNOSIS — R918 Other nonspecific abnormal finding of lung field: Secondary | ICD-10-CM

## 2021-05-14 DIAGNOSIS — M21371 Foot drop, right foot: Secondary | ICD-10-CM | POA: Diagnosis not present

## 2021-05-14 DIAGNOSIS — Z7189 Other specified counseling: Secondary | ICD-10-CM

## 2021-05-14 DIAGNOSIS — F17210 Nicotine dependence, cigarettes, uncomplicated: Secondary | ICD-10-CM | POA: Insufficient documentation

## 2021-05-14 MED ORDER — DEXAMETHASONE 4 MG PO TABS: 4.0000 mg | ORAL_TABLET | Freq: Every day | ORAL | 0 refills | Status: DC

## 2021-05-14 NOTE — Progress Notes (Signed)
Hematology/Oncology Consult note [facility] [phone] [phone]   Patient Care Team: Delsa Grana, PA-C as PCP - General (Family Medicine) Telford Nab, RN as Oncology Nurse Navigator  REFERRING PROVIDER: Duffy Bruce, MD  CHIEF COMPLAINTS/REASON FOR VISIT:  Evaluation of lung mass  HISTORY OF PRESENTING ILLNESS:   Jenna Newton is a  55 y.o.  female with PMH listed below was seen in consultation at the request of  Duffy Bruce, MD  for evaluation of lung mass  05/08/2021, patient presented to emergency room for evaluation of expressive aphasia.  Acute onset of symptoms.  She/has no other neurological deficits at that time.  In ER, patient was found to have expressive aphasia with intermittent word at rest, though receptive and follows commands. MRI brain with and without contrast showed multiple small peripherally enhancing lesions.  Primary differential consideration is metastatic disease.   Patient declined being admitted.  Was eventually convinced to stay to do CT chest abdomen pelvis with contrast. CT showed a 3.2 cm posterior left upper lobe mass extending from the left hilum, concerning for primary bronchogenic carcinoma.  Enlarged left hilar and prominent bilateral peritracheal lymph nodes are identified.  Worrisome for nodal metastasis.  Similar appearance of bilateral adrenal nodule compared with 12/17/2013.  Favor benign adenomas.  Coronary artery calcifications, enlarged fibroid uterus. Patient prefers to be discharged home.  She was sent on dexamethasone 4 mg twice daily.  And Keppra 500 mg twice daily.  Patient was referred to establish care with cancer center. Today she has no difficulty in speech.  She was accompanied by her cousin. She has some gait change with right foot drop. No additional episodes of expressive aphasia.  Patient drinks alcohol, 12 packs of beer /week, current every day smoker.   Review of Systems   Constitutional:  Negative for appetite change, chills, fatigue and fever.  HENT:   Negative for hearing loss and voice change.   Eyes:  Negative for eye problems.  Respiratory:  Negative for chest tightness and cough.   Cardiovascular:  Negative for chest pain.  Gastrointestinal:  Negative for abdominal distention, abdominal pain and blood in stool.  Endocrine: Negative for hot flashes.  Genitourinary:  Negative for difficulty urinating and frequency.   Musculoskeletal:  Positive for gait problem. Negative for arthralgias.  Skin:  Negative for itching and rash.  Neurological:  Positive for gait problem and speech difficulty. Negative for extremity weakness, headaches and seizures.  Hematological:  Negative for adenopathy.  Psychiatric/Behavioral:  Negative for confusion.    MEDICAL HISTORY:  Past Medical History:  Diagnosis Date   Anemia    GERD (gastroesophageal reflux disease)    Neuropathy    Tobacco abuse 03/14/2017   Vertigo    White matter disease of brain due to ischemia 12/04/2017   Head CT March 2019    SURGICAL HISTORY: Past Surgical History:  Procedure Laterality Date   none      SOCIAL HISTORY: Social History   Socioeconomic History   Marital status: Single    Spouse name: Not on file   Number of children: 0   Years of education: Not on file   Highest education level: Bachelor's degree (e.g., BA, AB, BS)  Occupational History   Not on file  Tobacco Use   Smoking status: Every Day    Packs/day: 0.50    Years: 25.00    Pack years: 12.50    Types: Cigarettes   Smokeless tobacco: Never  Vaping Use  Vaping Use: Never used  Substance and Sexual Activity   Alcohol use: Yes    Alcohol/week: 14.0 standard drinks    Types: 14 Cans of beer per week    Comment: 12 pack/ week   Drug use: No   Sexual activity: Yes    Partners: Male    Birth control/protection: Post-menopausal  Other Topics Concern   Not on file  Social History Narrative   Not on file    Social Determinants of Health   Financial Resource Strain: Not on file  Food Insecurity: Not on file  Transportation Needs: Not on file  Physical Activity: Not on file  Stress: Not on file  Social Connections: Not on file  Intimate Partner Violence: Not on file    FAMILY HISTORY: Family History  Problem Relation Age of Onset   Diabetes Mother    Hypertension Mother    Emphysema Mother    Emphysema Father    Diabetes Brother    Alcohol abuse Brother    Diabetes Maternal Grandmother    Hypertension Maternal Grandmother    Blindness Maternal Grandmother    Cancer Maternal Grandfather        prostate   Emphysema Maternal Grandfather    Cancer Paternal Grandmother    Emphysema Paternal Grandfather     ALLERGIES:  is allergic to penicillins, meclizine, and sulfa antibiotics.  MEDICATIONS:  Current Outpatient Medications  Medication Sig Dispense Refill   amLODipine (NORVASC) 5 MG tablet Take 1 tablet (5 mg total) by mouth daily. 90 tablet 3   atorvastatin (LIPITOR) 40 MG tablet Take 1 tablet (40 mg total) by mouth at bedtime. For cholesterol; this replaces lovastastin 90 tablet 3   cromolyn (OPTICROM) 4 % ophthalmic solution Place 1 drop into both eyes 4 (four) times daily as needed. 10 mL 3   dexamethasone (DECADRON) 4 MG tablet Take one tablet twice a day for 1 week, or until followed up with Oncology. If you cannot follow-up, decrease to 1 tablet daily for 3 days, then stop. 17 tablet 0   [START ON 05/17/2021] dexamethasone (DECADRON) 4 MG tablet Take 1 tablet (4 mg total) by mouth daily. 30 tablet 0   fluticasone (FLONASE) 50 MCG/ACT nasal spray Place into both nostrils.     levETIRAcetam (KEPPRA) 500 MG tablet Take 1 tablet (500 mg total) by mouth 2 (two) times daily. 120 tablet 0   levocetirizine (XYZAL) 5 MG tablet Take 1 tablet (5 mg total) by mouth every evening. 30 tablet 5   meclizine (ANTIVERT) 25 MG tablet Take 1 tablet (25 mg total) by mouth 3 (three) times daily  as needed for dizziness (vertigo). 90 tablet 11   montelukast (SINGULAIR) 10 MG tablet Take 1 tablet (10 mg total) by mouth at bedtime. 90 tablet 3   saline (AYR) GEL Place 1 application into the nose every 4 (four) hours. 14.1 g 3   vitamin B-12 (CYANOCOBALAMIN) 500 MCG tablet Take 1 tablet (500 mcg total) by mouth daily. 100 tablet 3   No current facility-administered medications for this visit.     PHYSICAL EXAMINATION: ECOG PERFORMANCE STATUS: 1 - Symptomatic but completely ambulatory Vitals:   05/14/21 1510  BP: (!) 147/87  Pulse: 78  Resp: 18  Temp: 98.4 F (36.9 C)   Filed Weights   05/14/21 1510  Weight: 121 lb 11.2 oz (55.2 kg)    Physical Exam Constitutional:      General: She is not in acute distress. HENT:     Head:  Normocephalic and atraumatic.  Eyes:     General: No scleral icterus. Cardiovascular:     Rate and Rhythm: Normal rate and regular rhythm.     Heart sounds: Normal heart sounds.  Pulmonary:     Effort: Pulmonary effort is normal. No respiratory distress.     Breath sounds: No wheezing.  Abdominal:     General: Bowel sounds are normal. There is no distension.     Palpations: Abdomen is soft.  Musculoskeletal:        General: No deformity. Normal range of motion.     Cervical back: Normal range of motion and neck supple.     Comments: Steppage gait due to right foot drop, decreased  dorsiflexion   Skin:    General: Skin is warm and dry.     Findings: No erythema or rash.  Neurological:     Mental Status: She is alert and oriented to person, place, and time. Mental status is at baseline.     Cranial Nerves: No cranial nerve deficit.     Coordination: Coordination normal.  Psychiatric:        Mood and Affect: Mood normal.    LABORATORY DATA:  I have reviewed the data as listed Lab Results  Component Value Date   WBC 7.5 05/08/2021   HGB 13.9 05/08/2021   HCT 40.9 05/08/2021   MCV 85.9 05/08/2021   PLT 284 05/08/2021   Recent Labs     05/08/21 1310  NA 135  K 4.4  CL 99  CO2 25  GLUCOSE 114*  BUN 13  CREATININE 0.45  CALCIUM 9.4  GFRNONAA >60  PROT 8.1  ALBUMIN 4.4  AST 35  ALT 30  ALKPHOS 98  BILITOT 1.2   Iron/TIBC/Ferritin/ %Sat    Component Value Date/Time   FERRITIN 126 03/14/2017 0926      RADIOGRAPHIC STUDIES: I have personally reviewed the radiological images as listed and agreed with the findings in the report. MR Brain W and Wo Contrast  Result Date: 05/08/2021 CLINICAL DATA:  Brain mass or lesion EXAM: MRI HEAD WITHOUT AND WITH CONTRAST TECHNIQUE: Multiplanar, multiecho pulse sequences of the brain and surrounding structures were obtained without and with intravenous contrast. CONTRAST:  7mL GADAVIST GADOBUTROL 1 MMOL/ML IV SOLN COMPARISON:  CT head earlier same day FINDINGS: Brain: Multiple (greater than 10) peripherally enhancing lesions are identified in bilateral cerebral hemispheres. No definite involvement of brainstem or cerebellum. Largest in the right corona radiata measures 1 cm and corresponds to one of the hyperdense lesions seen on CT. There is diffusion hyperintensity at the periphery. Mild edema associated with some of the lesions. There is no acute infarction or intracranial hemorrhage. No significant mass effect. No hydrocephalus or extra-axial collection. Vascular: Major vessel flow voids at the skull base are preserved. Skull and upper cervical spine: Normal marrow signal is preserved. Sinuses/Orbits: Paranasal sinuses are aerated. Orbits are unremarkable. Other: Sella is unremarkable.  Mastoid air cells are clear. IMPRESSION: Multiple small peripherally enhancing lesions. Primary differential consideration is metastatic disease. Septic emboli is another consideration in the appropriate setting. Electronically Signed   By: Macy Mis M.D.   On: 05/08/2021 15:13   CT CHEST ABDOMEN PELVIS W CONTRAST  Result Date: 05/08/2021 CLINICAL DATA:  Cancer of unknown primary. Patient presents  with multiple brain lesions. EXAM: CT CHEST, ABDOMEN, AND PELVIS WITH CONTRAST TECHNIQUE: Multidetector CT imaging of the chest, abdomen and pelvis was performed following the standard protocol during bolus administration of intravenous contrast.  CONTRAST:  51mL OMNIPAQUE IOHEXOL 350 MG/ML SOLN COMPARISON:  None. FINDINGS: CT CHEST FINDINGS Cardiovascular: Heart size is normal. Aortic atherosclerosis. No pericardial effusion. Coronary artery calcifications. Mediastinum/Nodes: Normal appearance of the thyroid gland. The trachea appears patent and is midline. Normal appearance of the esophagus. No enlarged supraclavicular or axillary lymph nodes. Prominent left paratracheal lymph node measures 1.1 cm, image 23/2. Prominent right paratracheal lymph node measures 1 cm, image 23/2. Enlarged left hilar lymph nodes are identified measuring up to 1.3 cm, image 61/4, image 26/2 and image 24/2 Lungs/Pleura: No pleural effusion. Centrilobular emphysema. Elongated mass within the posterior left upper lobe extends from the left hilum. This is best seen on the sagittal images where the mass measures 3.2 cm in length with a maximum diameter of 1.2 cm, image 107/5. No other pulmonary nodule or mass identified. Musculoskeletal: No chest wall mass or suspicious bone lesions identified. CT ABDOMEN PELVIS FINDINGS Hepatobiliary: No focal liver abnormality is seen. No gallstones, gallbladder wall thickening, or biliary dilatation. Pancreas: Unremarkable. No pancreatic ductal dilatation or surrounding inflammatory changes. Spleen: Normal in size without focal abnormality. Adrenals/Urinary Tract: Bilateral adrenal nodules are noted in appears similar to study from 12/17/2013. The largest is on the right measuring 2.4 x 1.9 cm, image 61/2. On 12/17/2013 this measured 2.5 x 1.7 cm. No kidney mass or hydronephrosis. Bladder unremarkable. Stomach/Bowel: Stomach is within normal limits. No evidence of bowel wall thickening, distention, or  inflammatory changes. Vascular/Lymphatic: Aortic atherosclerosis. No aneurysm. No abdominopelvic adenopathy. Reproductive: Enlarged partially calcified fibroid uterus is identified which measures 10.4 by 9.8 x 9.5 cm. No adnexal mass identified. Other: No free fluid or fluid collections. Musculoskeletal: No acute or significant osseous findings. IMPRESSION: 1. 3.2 cm posterior left upper lobe lung mass extends from the left hilum and is concerning for primary bronchogenic carcinoma. Further evaluation with PET-CT and tissue sampling advised. 2. Enlarged left hilar and prominent bilateral paratracheal lymph nodes are identified. Worrisome for nodal metastasis. 3. Similar appearance of bilateral adrenal nodules compared with 12/17/2013. Favor benign adenomas. 4. Coronary artery calcifications noted. 5. Enlarged fibroid uterus. Aortic Atherosclerosis (ICD10-I70.0) and Emphysema (ICD10-J43.9). Electronically Signed   By: Kerby Moors M.D.   On: 05/08/2021 16:55   CT HEAD CODE STROKE WO CONTRAST  Result Date: 05/08/2021 CLINICAL DATA:  Code stroke. EXAM: CT HEAD WITHOUT CONTRAST TECHNIQUE: Contiguous axial images were obtained from the base of the skull through the vertex without intravenous contrast. COMPARISON:  2019 FINDINGS: Brain: There is small area of ill-defined hyperdensity in the right corona radiata (series 4, image 16). Additional small areas present in the left parietal lobe involving cortex (image 23). Possible small area in the anterior right frontal white matter (image 13). Small area of low attenuation in the left frontal subcortical white matter could reflect chronic microvascular ischemic change or edema. Gray-white differentiation is preserved. Vascular: No hyperdense vessel. Intracranial atherosclerotic calcification at the skull base. Skull: Unremarkable. Sinuses/Orbits: No acute abnormality. Other: Mastoid air cells are clear. ASPECTS (Blair Stroke Program Early CT Score) - Ganglionic level  infarction (caudate, lentiform nuclei, internal capsule, insula, M1-M3 cortex): 7 - Supraganglionic infarction (M4-M6 cortex): 3 Total score (0-10 with 10 being normal): 10 IMPRESSION: Small ill-defined hyperdensity in the right corona radiata, left parietal lobe, and possibly in the anterior right frontal white matter. Small areas of acute hemorrhage are possible, but MRI is recommended to evaluate for underlying lesions. No evidence of acute infarction. These results were called by telephone at the time of interpretation  on 05/08/2021 at 1:04 pm to provider PHILLIP STAFFORD , who verbally acknowledged these results. Electronically Signed   By: Macy Mis M.D.   On: 05/08/2021 13:11      ASSESSMENT & PLAN:  1. Lung mass   2. Brain lesion   3. Goals of care, counseling/discussion   4. Right foot drop    # left upper lobe lung mass with left hilar and bilateral paratracheal lymph nodes, and multiple brain lesions. Likely primary lung cancer with nodal mets and brain mets. Stage IV. CT and MRI images were reviewed and dicussed with patient.  Continue Dexamethsone 4mg  BID and complete 1 week, followed by 4mg  daily.  Continue Keppra 500mg  BID. Advise patient to stop driving Refer to pulmonology for biopsy to establish tissue diagnosis.  Refer to Radonc.  Discussed case with Dr.Gonzalez.    Orders Placed This Encounter  Procedures   Ambulatory referral to Radiation Oncology    Referral Priority:   Routine    Referral Type:   Consultation    Referral Reason:   Specialty Services Required    Requested Specialty:   Radiation Oncology    Number of Visits Requested:   1   Ambulatory referral to Pulmonology    Referral Priority:   Routine    Referral Type:   Consultation    Referral Reason:   Specialty Services Required    Referred to Provider:   Tyler Pita, MD    Requested Specialty:   Pulmonary Disease    Number of Visits Requested:   1    All questions were answered. The patient  knows to call the clinic with any problems questions or concerns.  cc Duffy Bruce, MD    Return of visit: to be determined.  Thank you for this kind referral and the opportunity to participate in the care of this patient. A copy of today's note is routed to referring provider    Earlie Server, MD, PhD Hematology Oncology [facility]  05/14/2021

## 2021-05-14 NOTE — Progress Notes (Signed)
Patient here to establish care  

## 2021-05-14 NOTE — Telephone Encounter (Signed)
Received secure epic chat from [facility] to schedule patient for urgent consults.  Appt scheduled 05/16/2021 at 2:00.  Lm for patient.

## 2021-05-15 NOTE — Telephone Encounter (Signed)
Lm x2 for patient.  Will call once more due to nature of call.

## 2021-05-15 NOTE — Telephone Encounter (Signed)
Noted.  Will close encounter.  

## 2021-05-15 NOTE — Telephone Encounter (Signed)
Pt is aware of appt

## 2021-05-16 ENCOUNTER — Telehealth: Payer: Self-pay | Admitting: Pulmonary Disease

## 2021-05-16 ENCOUNTER — Other Ambulatory Visit: Payer: Self-pay

## 2021-05-16 ENCOUNTER — Encounter: Payer: Self-pay | Admitting: Pulmonary Disease

## 2021-05-16 ENCOUNTER — Ambulatory Visit (INDEPENDENT_AMBULATORY_CARE_PROVIDER_SITE_OTHER): Payer: BC Managed Care – PPO | Admitting: Pulmonary Disease

## 2021-05-16 VITALS — BP 124/60 | HR 98 | Temp 97.8°F | Ht 66.0 in | Wt 123.8 lb

## 2021-05-16 DIAGNOSIS — R590 Localized enlarged lymph nodes: Secondary | ICD-10-CM | POA: Diagnosis not present

## 2021-05-16 DIAGNOSIS — J449 Chronic obstructive pulmonary disease, unspecified: Secondary | ICD-10-CM

## 2021-05-16 DIAGNOSIS — R918 Other nonspecific abnormal finding of lung field: Secondary | ICD-10-CM

## 2021-05-16 DIAGNOSIS — F17210 Nicotine dependence, cigarettes, uncomplicated: Secondary | ICD-10-CM | POA: Diagnosis not present

## 2021-05-16 MED ORDER — STIOLTO RESPIMAT 2.5-2.5 MCG/ACT IN AERS: 2.0000 | INHALATION_SPRAY | Freq: Every day | RESPIRATORY_TRACT | 0 refills | Status: DC

## 2021-05-16 NOTE — H&P (View-Only) (Signed)
Subjective:    Patient ID: Jenna Newton, female    DOB: 1965/12/06, 55 y.o.   MRN: [id number] Chief Complaint  Patient presents with   pulmonary consult    C/o occ prod cough with clear sputum.    HPI Patient is a 55 year old current smoker (1 PPD) who resents for evaluation of a left upper lobe mass and mediastinal adenopathy.  She is kindly referred by Dr. Earlie Server.  The patient was in her usual state of health until 08 May 2021 when she "passed out" at work.  She does not recall the events and was brought to the emergency room for evaluation.  Immediately after the episode she had transient expressive aphasia though she was able to follow commands and had no receptive issues.  There is also question that she may have had a seizure prior to evaluation in the ED.  The patient had a CT scan of the head for evaluation of potential stroke and this shows some abnormalities that require follow-up with an MRI brain.  This showed multiple small peripherally enhancing lesions.  Primary differential consideration is metastatic disease.  The patient declined admission at that time she did have a CT chest abdomen and pelvis with contrast at that time CT chest showed a 3.2 cm posterior left upper lobe mass concerning for bronchogenic carcinoma and enlarged left hilar and prominent bilateral peritracheal lymph nodes.  This is consistent with nodal metastatic disease.  She was discharged home on dexamethasone and Keppra.  Previously she was noted to have a foot drop and slight gait disturbance.  These issues have improved.  Her speech is fluent today.  Patient presents for next step for evaluation of this lung lesion.  We discussed diagnostic modalities and that the preferred modality in this situation would be bronchoscopy with robotic navigation assistance and endobronchial ultrasound with TBNA.  She had the procedure explained and benefits, limitations and complications of the same explained.  She agrees to  proceed.  She does not endorse any fevers, chills or sweats.  No cough or sputum production.  No hemoptysis.  No chest pain, orthopnea or paroxysmal nocturnal dyspnea.  She does not have dyspnea and is able to perform her activities of daily living without difficulties.  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  She presents today with her cousin whom she has designated as DPOA.  As noted she is an active smoker 1 pack a day, she also drinks approximately 12 beers per week.   Review of Systems A 10 point review of systems was performed and it is as noted above otherwise negative.  Past Medical History:  Diagnosis Date   Anemia    GERD (gastroesophageal reflux disease)    Neuropathy    Tobacco abuse 03/14/2017   Vertigo    White matter disease of brain due to ischemia 12/04/2017   Head CT March 2019   Past Surgical History:  Procedure Laterality Date   none     Family History  Problem Relation Age of Onset   Diabetes Mother    Hypertension Mother    Emphysema Mother    Emphysema Father    Diabetes Brother    Alcohol abuse Brother    Diabetes Maternal Grandmother    Hypertension Maternal Grandmother    Blindness Maternal Grandmother    Cancer Maternal Grandfather        prostate   Emphysema Maternal Grandfather    Cancer  Paternal Grandmother    Emphysema Paternal Grandfather    Social History   Tobacco Use   Smoking status: Every Day    Packs/day: 1.00    Years: 25.00    Pack years: 25.00    Types: Cigarettes   Smokeless tobacco: Never  Substance Use Topics   Alcohol use: Yes    Alcohol/week: 14.0 standard drinks    Types: 14 Cans of beer per week    Comment: 12 pack/ week   Allergies  Allergen Reactions   Penicillins Other (See Comments)    swelling   Meclizine Other (See Comments)    Gave pt headaches    Sulfa Antibiotics Other (See Comments)    swelling   Current Meds  Medication Sig   amLODipine (NORVASC) 5  MG tablet Take 1 tablet (5 mg total) by mouth daily.   atorvastatin (LIPITOR) 40 MG tablet Take 1 tablet (40 mg total) by mouth at bedtime. For cholesterol; this replaces lovastastin   cromolyn (OPTICROM) 4 % ophthalmic solution Place 1 drop into both eyes 4 (four) times daily as needed.   [START ON 05/17/2021] dexamethasone (DECADRON) 4 MG tablet Take 1 tablet (4 mg total) by mouth daily.   fluticasone (FLONASE) 50 MCG/ACT nasal spray Place into both nostrils.   levETIRAcetam (KEPPRA) 500 MG tablet Take 1 tablet (500 mg total) by mouth 2 (two) times daily.   levocetirizine (XYZAL) 5 MG tablet Take 1 tablet (5 mg total) by mouth every evening.   meclizine (ANTIVERT) 25 MG tablet Take 1 tablet (25 mg total) by mouth 3 (three) times daily as needed for dizziness (vertigo).   montelukast (SINGULAIR) 10 MG tablet Take 1 tablet (10 mg total) by mouth at bedtime.   saline (AYR) GEL Place 1 application into the nose every 4 (four) hours.   vitamin B-12 (CYANOCOBALAMIN) 500 MCG tablet Take 1 tablet (500 mcg total) by mouth daily.   [DISCONTINUED] dexamethasone (DECADRON) 4 MG tablet Take one tablet twice a day for 1 week, or until followed up with Oncology. If you cannot follow-up, decrease to 1 tablet daily for 3 days, then stop.   Immunization History  Administered Date(s) Administered   [employer] Vaccination 05/15/2020, 06/12/2020       Objective:   Physical Exam BP 124/60 (BP Location: Left Arm, Cuff Size: Normal)   Pulse 98   Temp 97.8 F (36.6 C) (Temporal)   Ht 5\' 6"  (1.676 m)   Wt 123 lb 12.8 oz (56.2 kg)   LMP 01/23/2015   SpO2 95%   BMI 19.98 kg/m  GENERAL: Well-developed well-nourished woman, no acute distress.  Fully ambulatory, no conversational dyspnea HEAD: Normocephalic, atraumatic.  EYES: Pupils equal, round, reactive to light.  No scleral icterus.  MOUTH: Nose/mouth/throat not examined due to masking requirements for COVID 19. NECK: Supple. No thyromegaly.  Trachea midline. No JVD.  No adenopathy. PULMONARY: Good air entry bilaterally.  Scattered rhonchi throughout, no other adventitious sounds. CARDIOVASCULAR: S1 and S2. Regular rate and rhythm.  No rubs, murmurs or gallops heard. ABDOMEN: Benign. MUSCULOSKELETAL: No joint deformity, no clubbing, no edema.  NEUROLOGIC: No focal deficit, no gait disturbance noted today, speech is fluent today. SKIN: Intact,warm,dry. PSYCH: Mood and behavior normal  Representative image from chest CT performed 08 May 2021 showing left upper lobe mass     Assessment & Plan:     ICD-10-CM   1. Lung mass  R91.8 CT SUPER D CHEST WO CONTRAST   3.2 cm left upper lobe  lesion Will need diagnosis with robotic bronchoscopic biopsy Patient has been scheduled for 23 September    2. Mediastinal adenopathy  R59.0    Will perform EBUS with TBNA at the time of bronchoscopy    3. COPD suggested by initial evaluation ([place])  J44.9    Trial of Stiolto 2 inhalations daily    4. Tobacco dependence due to cigarettes  F17.210    Patient counseled regards to discontinuation of smoking     Orders Placed This Encounter  Procedures   CT SUPER D CHEST WO CONTRAST    MONARCH PROTOCOL  ARMS DOWN INSPIRATORY IMAGES    Standing Status:   Future    Standing Expiration Date:   11/13/2021    Scheduling Instructions:     Prior to 05/25/2021    Order Specific Question:   Preferred imaging location?    Answer:   [facility]    Order Specific Question:   Is patient pregnant?    Answer:   No   Meds ordered this encounter  Medications   Tiotropium Bromide-Olodaterol (STIOLTO RESPIMAT) 2.5-2.5 MCG/ACT AERS    Sig: Inhale 2 puffs into the lungs daily.    Dispense:  1 each    Refill:  0    Order Specific Question:   Lot Number?    Answer:   [id number]    Order Specific Question:   Expiration Date?    Answer:   04/02/2022    Order Specific Question:   Quantity    Answer:   2   Benefits, limitations and potential  complications of the procedure were discussed with the patient/family.  Complications from bronchoscopy are rare and most often minor, but if they occur they may include breathing difficulty, vocal cord spasm, hoarseness, slight fever, vomiting, dizziness, bronchospasm, infection, low blood oxygen, bleeding from biopsy site, or an allergic reaction to medications.  It is uncommon for patients to experience other more serious complications for example: Collapsed lung requiring chest tube placement, respiratory failure, heart attack and/or cardiac arrhythmia.  The patient understood these potential complications and agrees to proceed.  For ventilation during the procedure VT will be set that 400 mL with PEEP at 8-10.  Patient's procedure has been scheduled for Friday 23rd September at 12:15 PM.  We will see the patient in follow-up in 4 to 6 weeks time she is to contact us prior to that time should any new problems arise.  Renold Don, MD Advanced Bronchoscopy PCCM [facility]    *This note was dictated using voice recognition software/Dragon.  Despite best efforts to proofread, errors can occur which can change the meaning.  Any change was purely unintentional.

## 2021-05-16 NOTE — Telephone Encounter (Signed)
Robotic bronchoscopy with EBUS scheduled for 05/25/2021 at 12:15. [id number], [id number], [id number], [id number] RP:[id number] mass

## 2021-05-16 NOTE — Progress Notes (Addendum)
Subjective:    Patient ID: Jenna Newton, female    DOB: [birth year], 55 y.o.   MRN: [id number] Chief Complaint  Patient presents with   pulmonary consult    C/o occ prod cough with clear sputum.    HPI Patient is a 55 year old current smoker (1 PPD) who resents for evaluation of a left upper lobe mass and mediastinal adenopathy.  She is kindly referred by Dr. Earlie Server.  The patient was in her usual state of health until 08 May 2021 when she "passed out" at work.  She does not recall the events and was brought to the emergency room for evaluation.  Immediately after the episode she had transient expressive aphasia though she was able to follow commands and had no receptive issues.  There is also question that she may have had a seizure prior to evaluation in the ED.  The patient had a CT scan of the head for evaluation of potential stroke and this shows some abnormalities that require follow-up with an MRI brain.  This showed multiple small peripherally enhancing lesions.  Primary differential consideration is metastatic disease.  The patient declined admission at that time she did have a CT chest abdomen and pelvis with contrast at that time CT chest showed a 3.2 cm posterior left upper lobe mass concerning for bronchogenic carcinoma and enlarged left hilar and prominent bilateral peritracheal lymph nodes.  This is consistent with nodal metastatic disease.  She was discharged home on dexamethasone and Keppra.  Previously she was noted to have a foot drop and slight gait disturbance.  These issues have improved.  Her speech is fluent today.  Patient presents for next step for evaluation of this lung lesion.  We discussed diagnostic modalities and that the preferred modality in this situation would be bronchoscopy with robotic navigation assistance and endobronchial ultrasound with TBNA.  She had the procedure explained and benefits, limitations and complications of the same explained.  She agrees to  proceed.  She does not endorse any fevers, chills or sweats.  No cough or sputum production.  No hemoptysis.  No chest pain, orthopnea or paroxysmal nocturnal dyspnea.  She does not have dyspnea and is able to perform her activities of daily living without difficulties.  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  She presents today with her cousin whom she has designated as DPOA.  As noted she is an active smoker 1 pack a day, she also drinks approximately 12 beers per week.   Review of Systems A 10 point review of systems was performed and it is as noted above otherwise negative.  Past Medical History:  Diagnosis Date   Anemia    GERD (gastroesophageal reflux disease)    Neuropathy    Tobacco abuse 03/14/2017   Vertigo    White matter disease of brain due to ischemia 12/04/2017   Head CT March 2019   Past Surgical History:  Procedure Laterality Date   none     Family History  Problem Relation Age of Onset   Diabetes Mother    Hypertension Mother    Emphysema Mother    Emphysema Father    Diabetes Brother    Alcohol abuse Brother    Diabetes Maternal Grandmother    Hypertension Maternal Grandmother    Blindness Maternal Grandmother    Cancer Maternal Grandfather        prostate   Emphysema Maternal Grandfather    Cancer  Paternal Grandmother    Emphysema Paternal Grandfather    Social History   Tobacco Use   Smoking status: Every Day    Packs/day: 1.00    Years: 25.00    Pack years: 25.00    Types: Cigarettes   Smokeless tobacco: Never  Substance Use Topics   Alcohol use: Yes    Alcohol/week: 14.0 standard drinks    Types: 14 Cans of beer per week    Comment: 12 pack/ week   Allergies  Allergen Reactions   Penicillins Other (See Comments)    swelling   Meclizine Other (See Comments)    Gave pt headaches    Sulfa Antibiotics Other (See Comments)    swelling   Current Meds  Medication Sig   amLODipine (NORVASC) 5  MG tablet Take 1 tablet (5 mg total) by mouth daily.   atorvastatin (LIPITOR) 40 MG tablet Take 1 tablet (40 mg total) by mouth at bedtime. For cholesterol; this replaces lovastastin   cromolyn (OPTICROM) 4 % ophthalmic solution Place 1 drop into both eyes 4 (four) times daily as needed.   [START ON 05/17/2021] dexamethasone (DECADRON) 4 MG tablet Take 1 tablet (4 mg total) by mouth daily.   fluticasone (FLONASE) 50 MCG/ACT nasal spray Place into both nostrils.   levETIRAcetam (KEPPRA) 500 MG tablet Take 1 tablet (500 mg total) by mouth 2 (two) times daily.   levocetirizine (XYZAL) 5 MG tablet Take 1 tablet (5 mg total) by mouth every evening.   meclizine (ANTIVERT) 25 MG tablet Take 1 tablet (25 mg total) by mouth 3 (three) times daily as needed for dizziness (vertigo).   montelukast (SINGULAIR) 10 MG tablet Take 1 tablet (10 mg total) by mouth at bedtime.   saline (AYR) GEL Place 1 application into the nose every 4 (four) hours.   vitamin B-12 (CYANOCOBALAMIN) 500 MCG tablet Take 1 tablet (500 mcg total) by mouth daily.   [DISCONTINUED] dexamethasone (DECADRON) 4 MG tablet Take one tablet twice a day for 1 week, or until followed up with Oncology. If you cannot follow-up, decrease to 1 tablet daily for 3 days, then stop.   Immunization History  Administered Date(s) Administered   [employer] Vaccination 05/15/2020, 06/12/2020       Objective:   Physical Exam BP 124/60 (BP Location: Left Arm, Cuff Size: Normal)   Pulse 98   Temp 97.8 F (36.6 C) (Temporal)   Ht 5\' 6"  (1.676 m)   Wt 123 lb 12.8 oz (56.2 kg)   LMP 01/23/2015   SpO2 95%   BMI 19.98 kg/m  GENERAL: Well-developed well-nourished woman, no acute distress.  Fully ambulatory, no conversational dyspnea HEAD: Normocephalic, atraumatic.  EYES: Pupils equal, round, reactive to light.  No scleral icterus.  MOUTH: Nose/mouth/throat not examined due to masking requirements for COVID 19. NECK: Supple. No thyromegaly.  Trachea midline. No JVD.  No adenopathy. PULMONARY: Good air entry bilaterally.  Scattered rhonchi throughout, no other adventitious sounds. CARDIOVASCULAR: S1 and S2. Regular rate and rhythm.  No rubs, murmurs or gallops heard. ABDOMEN: Benign. MUSCULOSKELETAL: No joint deformity, no clubbing, no edema.  NEUROLOGIC: No focal deficit, no gait disturbance noted today, speech is fluent today. SKIN: Intact,warm,dry. PSYCH: Mood and behavior normal  Representative image from chest CT performed 08 May 2021 showing left upper lobe mass     Assessment & Plan:     ICD-10-CM   1. Lung mass  R91.8 CT SUPER D CHEST WO CONTRAST   3.2 cm left upper lobe  lesion Will need diagnosis with robotic bronchoscopic biopsy Patient has been scheduled for 23 September    2. Mediastinal adenopathy  R59.0    Will perform EBUS with TBNA at the time of bronchoscopy    3. COPD suggested by initial evaluation ([place])  J44.9    Trial of Stiolto 2 inhalations daily    4. Tobacco dependence due to cigarettes  F17.210    Patient counseled regards to discontinuation of smoking     Orders Placed This Encounter  Procedures   CT SUPER D CHEST WO CONTRAST    MONARCH PROTOCOL  ARMS DOWN INSPIRATORY IMAGES    Standing Status:   Future    Standing Expiration Date:   11/13/2021    Scheduling Instructions:     Prior to 05/25/2021    Order Specific Question:   Preferred imaging location?    Answer:   [facility]    Order Specific Question:   Is patient pregnant?    Answer:   No   Meds ordered this encounter  Medications   Tiotropium Bromide-Olodaterol (STIOLTO RESPIMAT) 2.5-2.5 MCG/ACT AERS    Sig: Inhale 2 puffs into the lungs daily.    Dispense:  1 each    Refill:  0    Order Specific Question:   Lot Number?    Answer:   [id number]    Order Specific Question:   Expiration Date?    Answer:   04/02/2022    Order Specific Question:   Quantity    Answer:   2   Benefits, limitations and potential  complications of the procedure were discussed with the patient/family.  Complications from bronchoscopy are rare and most often minor, but if they occur they may include breathing difficulty, vocal cord spasm, hoarseness, slight fever, vomiting, dizziness, bronchospasm, infection, low blood oxygen, bleeding from biopsy site, or an allergic reaction to medications.  It is uncommon for patients to experience other more serious complications for example: Collapsed lung requiring chest tube placement, respiratory failure, heart attack and/or cardiac arrhythmia.  The patient understood these potential complications and agrees to proceed.  For ventilation during the procedure VT will be set that 400 mL with PEEP at 8-10.  Patient's procedure has been scheduled for Friday 23rd September at 12:15 PM.  We will see the patient in follow-up in 4 to 6 weeks time she is to contact us prior to that time should any new problems arise.  Renold Don, MD Advanced Bronchoscopy PCCM [facility]    *This note was dictated using voice recognition software/Dragon.  Despite best efforts to proofread, errors can occur which can change the meaning.  Any change was purely unintentional.

## 2021-05-16 NOTE — Telephone Encounter (Signed)
Willneed to scheduled 4-6wk rov when patient is contacted with dates/times of covid test and PAT.

## 2021-05-16 NOTE — Telephone Encounter (Signed)
Phone pre admit visit 05/21/2021 between 8-1 and covid test 05/23/2021 between 8-12.  Lm for patient.

## 2021-05-16 NOTE — Patient Instructions (Signed)
We are doing a special scan so that we can draw on map for your procedure on Friday, 23 September  We have given you samples of an inhaler called Stiolto use 2 puffs once a day  Your procedure has been scheduled for 23 September on Friday you will be getting details on the preoperative evaluation and time to show up your procedure.  Continue taking your Decadron and seizure medication (Keppra)  We will see you in follow-up in 4 to 6 weeks time however we will be in contact with you after the procedure as well as in contact with Dr. Tasia Catchings

## 2021-05-17 NOTE — Telephone Encounter (Signed)
Lm for patient.  

## 2021-05-17 NOTE — Telephone Encounter (Signed)
For the codes 31627, [id number], [id number], [phone] Prior Auth Not Required Refer # [id number] [id number]. 05/17/2021

## 2021-05-18 ENCOUNTER — Telehealth: Payer: Self-pay

## 2021-05-18 NOTE — Telephone Encounter (Signed)
Patient brought [employer] diasablity forms to be filled out. Part B, HCP statement has been completed. Called patient and left voicemail to inform her that we have completed our part, but that she still needs to complete and answer questions on the form and that once she has completeed she can mail it to address listed on form or bring back for [place] to fax. Infromed pt in VM that form have been left at registration for pick up.

## 2021-05-18 NOTE — Telephone Encounter (Signed)
Patient is aware of below dates/times and voiced her understanding. Nothing further needed.

## 2021-05-21 ENCOUNTER — Other Ambulatory Visit
Admission: RE | Admit: 2021-05-21 | Discharge: 2021-05-21 | Disposition: A | Discharge status: Home / Self Care | Payer: BC Managed Care – PPO | Source: Ambulatory Visit | Attending: Pulmonary Disease | Admitting: Pulmonary Disease

## 2021-05-21 DIAGNOSIS — Z01810 Encounter for preprocedural cardiovascular examination: Secondary | ICD-10-CM | POA: Diagnosis not present

## 2021-05-21 DIAGNOSIS — Z01818 Encounter for other preprocedural examination: Secondary | ICD-10-CM | POA: Diagnosis not present

## 2021-05-21 HISTORY — DX: Essential (primary) hypertension: I10

## 2021-05-21 NOTE — Patient Instructions (Signed)
Your procedure is scheduled on: 05/25/21 - Friday Report to the Registration Desk on the 1st floor of the [facility]. To find out your arrival time, please call [phone] between 1PM - 3PM on: 05/24/21  [place] center for your Covid Test on 05/23/21 between 8 am and 12:00.  REMEMBER: Instructions that are not followed completely may result in serious medical risk, up to and including death; or upon the discretion of your surgeon and anesthesiologist your surgery may need to be rescheduled.  Do not eat food or drink and fluids after midnight the night before surgery.  No gum chewing, lozengers or hard candies.  TAKE THESE MEDICATIONS THE MORNING OF SURGERY WITH A SIP OF WATER: - amLODipine (NORVASC) 5 MG tablet - dexamethasone (DECADRON) 4 MG tablet - levETIRAcetam (KEPPRA) 500 MG tablet  One week prior to surgery: Stop Anti-inflammatories (NSAIDS) such as Advil, Aleve, Ibuprofen, Motrin, Naproxen, Naprosyn and Aspirin based products such as Excedrin, Goodys Powder, BC Powder.  Stop ANY OVER THE COUNTER supplements until after surgery.  You may take Tylenol if needed for pain up until the day of surgery.  No Alcohol for 24 hours before or after surgery.  No Smoking including e-cigarettes for 24 hours prior to surgery.  No chewable tobacco products for at least 6 hours prior to surgery.  No nicotine patches on the day of surgery.  Do not use any "recreational" drugs for at least a week prior to your surgery.  Please be advised that the combination of cocaine and anesthesia may have negative outcomes, up to and including death. If you test positive for cocaine, your surgery will be cancelled.  On the morning of surgery brush your teeth with toothpaste and water, you may rinse your mouth with mouthwash if you wish. Do not swallow any toothpaste or mouthwash.  Do not wear jewelry, make-up, hairpins, clips or nail polish.  Do not wear lotions, powders, or perfumes.    Do not shave body from the neck down 48 hours prior to surgery just in case you cut yourself which could leave a site for infection.  Also, freshly shaved skin may become irritated if using the CHG soap.  Contact lenses, hearing aids and dentures may not be worn into surgery.  Do not bring valuables to the hospital. [facility] is not responsible for any missing/lost belongings or valuables.   Notify your doctor if there is any change in your medical condition (cold, fever, infection).  Wear comfortable clothing (specific to your surgery type) to the hospital.  After surgery, you can help prevent lung complications by doing breathing exercises.  Take deep breaths and cough every 1-2 hours. Your doctor may order a device called an Incentive Spirometer to help you take deep breaths. When coughing or sneezing, hold a pillow firmly against your incision with both hands. This is called "splinting." Doing this helps protect your incision. It also decreases belly discomfort.  If you are being admitted to the hospital overnight, leave your suitcase in the car. After surgery it may be brought to your room.  If you are being discharged the day of surgery, you will not be allowed to drive home. You will need a responsible adult (18 years or older) to drive you home and stay with you that night.   If you are taking public transportation, you will need to have a responsible adult (18 years or older) with you. Please confirm with your physician that it is acceptable to use  public transportation.   Please call the [place] [facility]. at [phone] if you have any questions about these instructions.  Surgery Visitation Policy:  Patients undergoing a surgery or procedure may have one family member or support person with them as long as that person is not COVID-19 positive or experiencing its symptoms.  That person may remain in the waiting area during the procedure.  Inpatient  Visitation:    Visiting hours are 7 a.m. to 8 p.m. Inpatients will be allowed two visitors daily. The visitors may change each day during the patient's stay. No visitors under the age of 84. Any visitor under the age of 105 must be accompanied by an adult. The visitor must pass COVID-19 screenings, use hand sanitizer when entering and exiting the patient's room and wear a mask at all times, including in the patient's room. Patients must also wear a mask when staff or their visitor are in the room. Masking is required regardless of vaccination status.

## 2021-05-22 ENCOUNTER — Ambulatory Visit: Payer: BC Managed Care – PPO

## 2021-05-23 ENCOUNTER — Ambulatory Visit
Admission: RE | Admit: 2021-05-23 | Discharge: 2021-05-23 | Disposition: A | Discharge status: Home / Self Care | Payer: BC Managed Care – PPO | Source: Ambulatory Visit | Attending: Radiation Oncology | Admitting: Radiation Oncology

## 2021-05-23 ENCOUNTER — Encounter: Payer: Self-pay | Admitting: *Deleted

## 2021-05-23 ENCOUNTER — Other Ambulatory Visit: Payer: Self-pay

## 2021-05-23 ENCOUNTER — Other Ambulatory Visit
Admission: RE | Admit: 2021-05-23 | Discharge: 2021-05-23 | Disposition: A | Discharge status: Home / Self Care | Payer: BC Managed Care – PPO | Source: Ambulatory Visit | Attending: Pulmonary Disease | Admitting: Pulmonary Disease

## 2021-05-23 VITALS — Wt 124.0 lb

## 2021-05-23 DIAGNOSIS — R4701 Aphasia: Secondary | ICD-10-CM | POA: Diagnosis not present

## 2021-05-23 DIAGNOSIS — Z20822 Contact with and (suspected) exposure to covid-19: Secondary | ICD-10-CM | POA: Insufficient documentation

## 2021-05-23 DIAGNOSIS — F17210 Nicotine dependence, cigarettes, uncomplicated: Secondary | ICD-10-CM | POA: Insufficient documentation

## 2021-05-23 DIAGNOSIS — I10 Essential (primary) hypertension: Secondary | ICD-10-CM | POA: Insufficient documentation

## 2021-05-23 DIAGNOSIS — R918 Other nonspecific abnormal finding of lung field: Secondary | ICD-10-CM | POA: Insufficient documentation

## 2021-05-23 DIAGNOSIS — Z882 Allergy status to sulfonamides status: Secondary | ICD-10-CM | POA: Diagnosis not present

## 2021-05-23 DIAGNOSIS — Z01812 Encounter for preprocedural laboratory examination: Secondary | ICD-10-CM | POA: Insufficient documentation

## 2021-05-23 DIAGNOSIS — K219 Gastro-esophageal reflux disease without esophagitis: Secondary | ICD-10-CM | POA: Diagnosis not present

## 2021-05-23 DIAGNOSIS — C3412 Malignant neoplasm of upper lobe, left bronchus or lung: Secondary | ICD-10-CM | POA: Diagnosis not present

## 2021-05-23 DIAGNOSIS — G629 Polyneuropathy, unspecified: Secondary | ICD-10-CM | POA: Insufficient documentation

## 2021-05-23 DIAGNOSIS — Z888 Allergy status to other drugs, medicaments and biological substances status: Secondary | ICD-10-CM | POA: Diagnosis not present

## 2021-05-23 DIAGNOSIS — Z79899 Other long term (current) drug therapy: Secondary | ICD-10-CM | POA: Insufficient documentation

## 2021-05-23 DIAGNOSIS — Z809 Family history of malignant neoplasm, unspecified: Secondary | ICD-10-CM | POA: Insufficient documentation

## 2021-05-23 DIAGNOSIS — G939 Disorder of brain, unspecified: Secondary | ICD-10-CM | POA: Diagnosis not present

## 2021-05-23 DIAGNOSIS — R590 Localized enlarged lymph nodes: Secondary | ICD-10-CM | POA: Diagnosis not present

## 2021-05-23 DIAGNOSIS — C7931 Secondary malignant neoplasm of brain: Secondary | ICD-10-CM | POA: Diagnosis not present

## 2021-05-23 DIAGNOSIS — D649 Anemia, unspecified: Secondary | ICD-10-CM | POA: Diagnosis not present

## 2021-05-23 DIAGNOSIS — Z880 Allergy status to penicillin: Secondary | ICD-10-CM | POA: Diagnosis not present

## 2021-05-23 LAB — SARS CORONAVIRUS 2 (TAT 6-24 HRS): SARS Coronavirus 2: NEGATIVE

## 2021-05-23 NOTE — Consult Note (Signed)
NEW PATIENT EVALUATION  Name: Jenna Newton  MRN: [id number]  Date:   05/23/2021     DOB: 1966/06/19   This 55 y.o. female patient presents to the clinic for initial evaluation of probable stage IV lung cancer with brain metastasis.  REFERRING PHYSICIAN: Delsa Grana, PA-C  CHIEF COMPLAINT:  Chief Complaint  Patient presents with   Consult    DIAGNOSIS: The encounter diagnosis was Brain lesion.   PREVIOUS INVESTIGATIONS:  MRI of brain reviewed CT scan of chest reviewed Clinical notes reviewed Bronchoscopy with biopsy pending  HPI: Patient is a 55 year old female presents to the emergency room with aphasia.  Brain MRI can for multiple small peripheral enhancing lesions compatible with metastatic disease.  CT scan of her chest showed a 3.2 cm left upper lobe lung mass extending from the left hilum concerning for primary bronchogenic carcinoma.  She does have enlarged left hilar and bilateral paratracheal lymph nodes worrisome for nodal metastasis.  She is scheduled for bronchoscopy for tissue confirmation the end of this week.  She is seen today for consideration of palliative radiation therapy to her brain.  She is currently on steroids her aphasia has cleared.  She is having slight right lower extremity foot drop no other focal neurologic deficits.  PLANNED TREATMENT REGIMEN: Whole brain radiation  PAST MEDICAL HISTORY:  has a past medical history of Anemia, GERD (gastroesophageal reflux disease), Hypertension, Neuropathy, Tobacco abuse (03/14/2017), Vertigo, and White matter disease of brain due to ischemia (12/04/2017).    PAST SURGICAL HISTORY:  Past Surgical History:  Procedure Laterality Date   none      FAMILY HISTORY: family history includes Alcohol abuse in her brother; Blindness in her maternal grandmother; Cancer in her maternal grandfather and paternal grandmother; Diabetes in her brother, maternal grandmother, and mother; Emphysema in her father, maternal grandfather,  mother, and paternal grandfather; Hypertension in her maternal grandmother and mother.  SOCIAL HISTORY:  reports that she has been smoking cigarettes. She has a 25.00 pack-year smoking history. She has never used smokeless tobacco. She reports current alcohol use of about 14.0 standard drinks per week. She reports that she does not use drugs.  ALLERGIES: Penicillins, Meclizine, and Sulfa antibiotics  MEDICATIONS:  Current Outpatient Medications  Medication Sig Dispense Refill   amLODipine (NORVASC) 5 MG tablet Take 1 tablet (5 mg total) by mouth daily. 90 tablet 3   Aspirin-Caffeine (BC FAST PAIN RELIEF PO) Take 1 packet by mouth daily as needed (pain).     cromolyn (OPTICROM) 4 % ophthalmic solution Place 1 drop into both eyes 4 (four) times daily as needed. 10 mL 3   dexamethasone (DECADRON) 4 MG tablet Take 1 tablet (4 mg total) by mouth daily. 30 tablet 0   fluticasone (FLONASE) 50 MCG/ACT nasal spray Place 1 spray into both nostrils daily as needed for allergies.     levETIRAcetam (KEPPRA) 500 MG tablet Take 1 tablet (500 mg total) by mouth 2 (two) times daily. 120 tablet 0   levocetirizine (XYZAL) 5 MG tablet Take 1 tablet (5 mg total) by mouth every evening. 30 tablet 5   meclizine (ANTIVERT) 25 MG tablet Take 1 tablet (25 mg total) by mouth 3 (three) times daily as needed for dizziness (vertigo). 90 tablet 11   montelukast (SINGULAIR) 10 MG tablet Take 1 tablet (10 mg total) by mouth at bedtime. 90 tablet 3   saline (AYR) GEL Place 1 application into the nose every 4 (four) hours. (Patient taking differently: Place 1 application into  the nose every 4 (four) hours as needed (congestion).) 14.1 g 3   Tiotropium Bromide-Olodaterol (STIOLTO RESPIMAT) 2.5-2.5 MCG/ACT AERS Inhale 2 puffs into the lungs daily. (Patient taking differently: Inhale 2 puffs into the lungs 2 (two) times daily.) 1 each 0   vitamin B-12 (CYANOCOBALAMIN) 500 MCG tablet Take 1 tablet (500 mcg total) by mouth daily. 100  tablet 3   atorvastatin (LIPITOR) 40 MG tablet Take 1 tablet (40 mg total) by mouth at bedtime. For cholesterol; this replaces lovastastin (Patient not taking: No sig reported) 90 tablet 3   No current facility-administered medications for this encounter.    ECOG PERFORMANCE STATUS:  1 - Symptomatic but completely ambulatory  REVIEW OF SYSTEMS: Patient denies any weight loss, fatigue, weakness, fever, chills or night sweats. Patient denies any loss of vision, blurred vision. Patient denies any ringing  of the ears or hearing loss. No irregular heartbeat. Patient denies heart murmur or history of fainting. Patient denies any chest pain or pain radiating to her upper extremities. Patient denies any shortness of breath, difficulty breathing at night, cough or hemoptysis. Patient denies any swelling in the lower legs. Patient denies any nausea vomiting, vomiting of blood, or coffee ground material in the vomitus. Patient denies any stomach pain. Patient states has had normal bowel movements no significant constipation or diarrhea. Patient denies any dysuria, hematuria or significant nocturia. Patient denies any problems walking, swelling in the joints or loss of balance. Patient denies any skin changes, loss of hair or loss of weight. Patient denies any excessive worrying or anxiety or significant depression. Patient denies any problems with insomnia. Patient denies excessive thirst, polyuria, polydipsia. Patient denies any swollen glands, patient denies easy bruising or easy bleeding. Patient denies any recent infections, allergies or URI. Patient "s visual fields have not changed significantly in recent time.   PHYSICAL EXAM: Wt 124 lb (56.2 kg)   LMP 01/23/2015   BMI 20.01 kg/m  No focal neurologic deficits are appreciated.  Motor and sensory levels are equal symmetric in upper lower extremities.  Crude visual fields are within normal range.  Well-developed well-nourished patient in NAD. HEENT reveals  PERLA, EOMI, discs not visualized.  Oral cavity is clear. No oral mucosal lesions are identified. Neck is clear without evidence of cervical or supraclavicular adenopathy. Lungs are clear to A&P. Cardiac examination is essentially unremarkable with regular rate and rhythm without murmur rub or thrill. Abdomen is benign with no organomegaly or masses noted. Motor sensory and DTR levels are equal and symmetric in the upper and lower extremities. Cranial nerves II through XII are grossly intact. Proprioception is intact. No peripheral adenopathy or edema is identified. No motor or sensory levels are noted. Crude visual fields are within normal range.  LABORATORY DATA: Labs reviewed pathology pending    RADIOLOGY RESULTS: CT scan of the head chest abdomen pelvis as well as MRI of brain all reviewed compatible with above-stated findings   IMPRESSION: Probable stage IV lung cancer with brain metastasis in 56 year old female  PLAN: At this time have recommended whole brain radiation.  Therapy.  Would plan on delivering 30 Gray in 10 fractions.  Risks and benefits of treatment including hair loss skin reaction fatigue alteration of blood counts all were discussed in detail with the patient.  We will make further determinations when we have tissue confirmation of her lung mass.  Patient seems to comprehend the recommendations well I personally set up and ordered CT simulation for later this week.  I  would like to take this opportunity to thank you for allowing me to participate in the care of your patient.Noreene Filbert, MD

## 2021-05-24 ENCOUNTER — Ambulatory Visit
Admission: RE | Admit: 2021-05-24 | Discharge: 2021-05-24 | Disposition: A | Discharge status: Home / Self Care | Payer: BC Managed Care – PPO | Source: Ambulatory Visit | Attending: Radiation Oncology | Admitting: Radiation Oncology

## 2021-05-24 ENCOUNTER — Ambulatory Visit
Admission: RE | Admit: 2021-05-24 | Discharge: 2021-05-24 | Disposition: A | Discharge status: Home / Self Care | Payer: BC Managed Care – PPO | Source: Ambulatory Visit | Attending: Pulmonary Disease | Admitting: Pulmonary Disease

## 2021-05-24 ENCOUNTER — Inpatient Hospital Stay: Payer: BC Managed Care – PPO

## 2021-05-24 DIAGNOSIS — J439 Emphysema, unspecified: Secondary | ICD-10-CM | POA: Diagnosis not present

## 2021-05-24 DIAGNOSIS — C7931 Secondary malignant neoplasm of brain: Secondary | ICD-10-CM | POA: Diagnosis not present

## 2021-05-24 DIAGNOSIS — R911 Solitary pulmonary nodule: Secondary | ICD-10-CM | POA: Diagnosis not present

## 2021-05-24 DIAGNOSIS — I700 Atherosclerosis of aorta: Secondary | ICD-10-CM | POA: Diagnosis not present

## 2021-05-24 DIAGNOSIS — Z510 Encounter for antineoplastic radiation therapy: Secondary | ICD-10-CM | POA: Insufficient documentation

## 2021-05-24 DIAGNOSIS — F17210 Nicotine dependence, cigarettes, uncomplicated: Secondary | ICD-10-CM | POA: Diagnosis not present

## 2021-05-24 DIAGNOSIS — C3412 Malignant neoplasm of upper lobe, left bronchus or lung: Secondary | ICD-10-CM | POA: Insufficient documentation

## 2021-05-24 DIAGNOSIS — R918 Other nonspecific abnormal finding of lung field: Secondary | ICD-10-CM | POA: Insufficient documentation

## 2021-05-24 NOTE — Progress Notes (Signed)
Met with patient during initial consult with Dr. Baruch Gouty. All questions answered during visit. Reviewed upcoming appts. Pt stated that she needs help with transportation. Introduced to transportation services through [employer]. Scheduled for pickup for all upcoming appts. Informed pt that will need family member or friend to drive her for procedure on Friday. Contact info given and instructed to call with any questions or needs. Pt verbalized understanding. Nothing further needed at this time.

## 2021-05-25 ENCOUNTER — Ambulatory Visit: Payer: BC Managed Care – PPO | Admitting: Anesthesiology

## 2021-05-25 ENCOUNTER — Ambulatory Visit: Payer: BC Managed Care – PPO

## 2021-05-25 ENCOUNTER — Other Ambulatory Visit: Payer: Self-pay | Admitting: *Deleted

## 2021-05-25 ENCOUNTER — Encounter
Admission: RE | Disposition: A | Discharge status: Home / Self Care | Payer: Self-pay | Source: Home / Self Care | Attending: Pulmonary Disease

## 2021-05-25 ENCOUNTER — Other Ambulatory Visit: Payer: Self-pay

## 2021-05-25 ENCOUNTER — Encounter: Payer: Self-pay | Admitting: Pulmonary Disease

## 2021-05-25 ENCOUNTER — Ambulatory Visit
Admission: RE | Admit: 2021-05-25 | Discharge: 2021-05-25 | Disposition: A | Discharge status: Home / Self Care | Payer: BC Managed Care – PPO | Attending: Pulmonary Disease | Admitting: Pulmonary Disease

## 2021-05-25 DIAGNOSIS — R918 Other nonspecific abnormal finding of lung field: Secondary | ICD-10-CM | POA: Diagnosis not present

## 2021-05-25 DIAGNOSIS — Z79899 Other long term (current) drug therapy: Secondary | ICD-10-CM | POA: Insufficient documentation

## 2021-05-25 DIAGNOSIS — Z888 Allergy status to other drugs, medicaments and biological substances status: Secondary | ICD-10-CM | POA: Diagnosis not present

## 2021-05-25 DIAGNOSIS — E785 Hyperlipidemia, unspecified: Secondary | ICD-10-CM | POA: Diagnosis not present

## 2021-05-25 DIAGNOSIS — F17210 Nicotine dependence, cigarettes, uncomplicated: Secondary | ICD-10-CM | POA: Insufficient documentation

## 2021-05-25 DIAGNOSIS — R590 Localized enlarged lymph nodes: Secondary | ICD-10-CM | POA: Insufficient documentation

## 2021-05-25 DIAGNOSIS — C3412 Malignant neoplasm of upper lobe, left bronchus or lung: Secondary | ICD-10-CM | POA: Diagnosis not present

## 2021-05-25 DIAGNOSIS — Z9889 Other specified postprocedural states: Secondary | ICD-10-CM

## 2021-05-25 DIAGNOSIS — R911 Solitary pulmonary nodule: Secondary | ICD-10-CM | POA: Diagnosis not present

## 2021-05-25 DIAGNOSIS — Z882 Allergy status to sulfonamides status: Secondary | ICD-10-CM | POA: Diagnosis not present

## 2021-05-25 DIAGNOSIS — J439 Emphysema, unspecified: Secondary | ICD-10-CM | POA: Diagnosis not present

## 2021-05-25 DIAGNOSIS — Z880 Allergy status to penicillin: Secondary | ICD-10-CM | POA: Diagnosis not present

## 2021-05-25 DIAGNOSIS — Z20822 Contact with and (suspected) exposure to covid-19: Secondary | ICD-10-CM | POA: Diagnosis not present

## 2021-05-25 DIAGNOSIS — G939 Disorder of brain, unspecified: Secondary | ICD-10-CM

## 2021-05-25 HISTORY — PX: VIDEO BRONCHOSCOPY WITH ENDOBRONCHIAL NAVIGATION: SHX6175

## 2021-05-25 HISTORY — PX: VIDEO BRONCHOSCOPY WITH ENDOBRONCHIAL ULTRASOUND: SHX6177

## 2021-05-25 SURGERY — VIDEO BRONCHOSCOPY WITH ENDOBRONCHIAL NAVIGATION
Anesthesia: General

## 2021-05-25 MED ORDER — LACTATED RINGERS IV SOLN: INTRAVENOUS | Status: DC

## 2021-05-25 MED ORDER — PROPOFOL 10 MG/ML IV BOLUS
INTRAVENOUS | Status: AC
  Filled 2021-05-25: qty 20

## 2021-05-25 MED ORDER — CHLORHEXIDINE GLUCONATE 0.12 % MT SOLN
OROMUCOSAL | Status: AC
  Administered 2021-05-25: 15 mL via OROMUCOSAL
  Filled 2021-05-25: qty 15

## 2021-05-25 MED ORDER — KETOROLAC TROMETHAMINE 30 MG/ML IJ SOLN
INTRAMUSCULAR | Status: AC
  Filled 2021-05-25: qty 1

## 2021-05-25 MED ORDER — MIDAZOLAM HCL 2 MG/2ML IJ SOLN
INTRAMUSCULAR | Status: DC | PRN
  Administered 2021-05-25: 2 mg via INTRAVENOUS

## 2021-05-25 MED ORDER — IPRATROPIUM-ALBUTEROL 0.5-2.5 (3) MG/3ML IN SOLN
RESPIRATORY_TRACT | Status: AC
  Administered 2021-05-25: 3 mL via RESPIRATORY_TRACT
  Filled 2021-05-25: qty 3

## 2021-05-25 MED ORDER — KETAMINE HCL 10 MG/ML IJ SOLN
INTRAMUSCULAR | Status: DC | PRN
  Administered 2021-05-25: 10 mg via INTRAVENOUS
  Administered 2021-05-25: 20 mg via INTRAVENOUS

## 2021-05-25 MED ORDER — CHLORHEXIDINE GLUCONATE 0.12 % MT SOLN: 15.0000 mL | Freq: Once | OROMUCOSAL | Status: AC

## 2021-05-25 MED ORDER — ACETAMINOPHEN 325 MG PO TABS: 325.0000 mg | ORAL_TABLET | ORAL | Status: DC | PRN

## 2021-05-25 MED ORDER — OXYCODONE HCL 5 MG/5ML PO SOLN: 5.0000 mg | Freq: Once | ORAL | Status: DC | PRN

## 2021-05-25 MED ORDER — HYDROMORPHONE HCL 1 MG/ML IJ SOLN: 0.2500 mg | INTRAMUSCULAR | Status: DC | PRN

## 2021-05-25 MED ORDER — DEXMEDETOMIDINE (PRECEDEX) IN NS 20 MCG/5ML (4 MCG/ML) IV SYRINGE
PREFILLED_SYRINGE | INTRAVENOUS | Status: DC | PRN
  Administered 2021-05-25: 8 ug via INTRAVENOUS

## 2021-05-25 MED ORDER — FENTANYL CITRATE (PF) 100 MCG/2ML IJ SOLN
INTRAMUSCULAR | Status: DC | PRN
  Administered 2021-05-25: 100 ug via INTRAVENOUS

## 2021-05-25 MED ORDER — LIDOCAINE HCL (CARDIAC) PF 100 MG/5ML IV SOSY
PREFILLED_SYRINGE | INTRAVENOUS | Status: DC | PRN
  Administered 2021-05-25 (×2): 50 mg via INTRAVENOUS

## 2021-05-25 MED ORDER — ONDANSETRON HCL 4 MG/2ML IJ SOLN
INTRAMUSCULAR | Status: AC
  Filled 2021-05-25: qty 2

## 2021-05-25 MED ORDER — FAMOTIDINE 20 MG PO TABS: 20.0000 mg | ORAL_TABLET | Freq: Once | ORAL | Status: AC

## 2021-05-25 MED ORDER — DEXAMETHASONE SODIUM PHOSPHATE 10 MG/ML IJ SOLN
INTRAMUSCULAR | Status: AC
  Filled 2021-05-25: qty 1

## 2021-05-25 MED ORDER — ACETAMINOPHEN 10 MG/ML IV SOLN: 1000.0000 mg | Freq: Once | INTRAVENOUS | Status: DC | PRN

## 2021-05-25 MED ORDER — FENTANYL CITRATE (PF) 100 MCG/2ML IJ SOLN
INTRAMUSCULAR | Status: AC
  Filled 2021-05-25: qty 2

## 2021-05-25 MED ORDER — ACETAMINOPHEN 160 MG/5ML PO SOLN
325.0000 mg | ORAL | Status: DC | PRN
  Filled 2021-05-25: qty 20.3

## 2021-05-25 MED ORDER — FAMOTIDINE 20 MG PO TABS
ORAL_TABLET | ORAL | Status: AC
  Administered 2021-05-25: 20 mg via ORAL
  Filled 2021-05-25: qty 1

## 2021-05-25 MED ORDER — ROCURONIUM BROMIDE 100 MG/10ML IV SOLN
INTRAVENOUS | Status: DC | PRN
  Administered 2021-05-25: 40 mg via INTRAVENOUS
  Administered 2021-05-25: 10 mg via INTRAVENOUS
  Administered 2021-05-25: 30 mg via INTRAVENOUS

## 2021-05-25 MED ORDER — MIDAZOLAM HCL 2 MG/2ML IJ SOLN
INTRAMUSCULAR | Status: AC
  Filled 2021-05-25: qty 2

## 2021-05-25 MED ORDER — MEPERIDINE HCL 25 MG/ML IJ SOLN: 6.2500 mg | INTRAMUSCULAR | Status: DC | PRN

## 2021-05-25 MED ORDER — ORAL CARE MOUTH RINSE: 15.0000 mL | Freq: Once | OROMUCOSAL | Status: AC

## 2021-05-25 MED ORDER — PROMETHAZINE HCL 25 MG/ML IJ SOLN: 12.5000 mg | Freq: Once | INTRAMUSCULAR | Status: DC | PRN

## 2021-05-25 MED ORDER — IPRATROPIUM-ALBUTEROL 0.5-2.5 (3) MG/3ML IN SOLN
3.0000 mL | Freq: Once | RESPIRATORY_TRACT | Status: AC
Start: 2021-05-25 — End: 2021-05-25

## 2021-05-25 MED ORDER — OXYCODONE HCL 5 MG PO TABS: 5.0000 mg | ORAL_TABLET | Freq: Once | ORAL | Status: DC | PRN

## 2021-05-25 MED ORDER — PROPOFOL 10 MG/ML IV BOLUS
INTRAVENOUS | Status: DC | PRN
  Administered 2021-05-25: 30 mg via INTRAVENOUS
  Administered 2021-05-25: 100 mg via INTRAVENOUS

## 2021-05-25 MED ORDER — SUGAMMADEX SODIUM 500 MG/5ML IV SOLN
INTRAVENOUS | Status: AC
  Filled 2021-05-25: qty 5

## 2021-05-25 MED ORDER — SEVOFLURANE IN SOLN
RESPIRATORY_TRACT | Status: AC
  Filled 2021-05-25: qty 250

## 2021-05-25 MED ORDER — PHENYLEPHRINE HCL (PRESSORS) 10 MG/ML IV SOLN
INTRAVENOUS | Status: DC | PRN
  Administered 2021-05-25 (×3): 100 ug via INTRAVENOUS

## 2021-05-25 MED ORDER — DEXMEDETOMIDINE HCL IN NACL 200 MCG/50ML IV SOLN
INTRAVENOUS | Status: AC
  Filled 2021-05-25: qty 50

## 2021-05-25 MED ORDER — LIDOCAINE HCL (PF) 2 % IJ SOLN
INTRAMUSCULAR | Status: AC
  Filled 2021-05-25: qty 5

## 2021-05-25 MED ORDER — KETAMINE HCL 50 MG/5ML IJ SOSY
PREFILLED_SYRINGE | INTRAMUSCULAR | Status: AC
  Filled 2021-05-25: qty 5

## 2021-05-25 MED ORDER — SUCCINYLCHOLINE CHLORIDE 200 MG/10ML IV SOSY
PREFILLED_SYRINGE | INTRAVENOUS | Status: DC | PRN
  Administered 2021-05-25: 100 mg via INTRAVENOUS

## 2021-05-25 MED ORDER — SODIUM CHLORIDE 0.9 % IV SOLN: Freq: Once | INTRAVENOUS | Status: DC

## 2021-05-25 NOTE — Anesthesia Preprocedure Evaluation (Addendum)
Anesthesia Evaluation  Patient identified by MRN, date of birth, ID band Patient awake    Reviewed: Allergy & Precautions, NPO status , Patient's Chart, lab work & pertinent test results  History of Anesthesia Complications History of anesthetic complications: no prior GA; no family hx.  Airway Mallampati: I  TM Distance: >3 FB Neck ROM: Full    Dental  (+) Edentulous Upper, Edentulous Lower   Pulmonary Current Smoker and Patient abstained from smoking.,  3.2 cm left upper lobe lung mass   Pulmonary exam normal        Cardiovascular hypertension, Normal cardiovascular exam     Neuro/Psych  05/2020 presented with aphasia.  MRI consistent with likely metastatic lesions.  Aphasia improved after starting steroids.  Also has right foot drop  Vertigo Neuropathy negative psych ROS   GI/Hepatic GERD  ,(+)     substance abuse (14 drinks/week)  ,   Endo/Other  negative endocrine ROS  Renal/GU negative Renal ROS  negative genitourinary   Musculoskeletal negative musculoskeletal ROS (+)   Abdominal   Peds negative pediatric ROS (+)  Hematology  (+) anemia , probable stage IV lung cancer with brain metastasis   Anesthesia Other Findings EKG 05/21/21 - NSR  Reproductive/Obstetrics negative OB ROS                            Anesthesia Physical Anesthesia Plan  ASA: 3  Anesthesia Plan: General   Post-op Pain Management:    Induction:   PONV Risk Score and Plan: 2  Airway Management Planned:   Additional Equipment:   Intra-op Plan:   Post-operative Plan:   Informed Consent: I have reviewed the patients History and Physical, chart, labs and discussed the procedure including the risks, benefits and alternatives for the proposed anesthesia with the patient or authorized representative who has indicated his/her understanding and acceptance.       Plan Discussed with:   Anesthesia  Plan Comments: ( )       Anesthesia Quick Evaluation

## 2021-05-25 NOTE — Op Note (Signed)
Robotic assisted bronchoscopy Cellvizio probe based confocal laser endomicroscopy (pCLE) Augmented fluoroscopy   Indication: Left upper lobe mass, mediastinal adenopathy all FDG avid  Preoperative Diagnosis: Left upper lobe mass, mediastinal adenopathy rule out cancer Post Procedure Diagnosis: Same as above, suspect small cell carcinoma Consent: Verbal/Written: obtained  Benefits, limitations and potential complications of the procedure were discussed with the patient/family.  Complications from bronchoscopy are rare and most often minor, but if they occur they may include breathing difficulty, vocal cord spasm, hoarseness, slight fever, vomiting, dizziness, bronchospasm, infection, low blood oxygen, bleeding from biopsy site, or an allergic reaction to medications.  It is uncommon for patients to experience other more serious complications for example: Collapsed lung requiring chest tube placement, respiratory failure, heart attack and/or cardiac arrhythmia.  Patient understood the potential complications and agreed to proceed.  Surgeon: Renold Don, MD Assistant/Scrub: Harriet Pho, RRT Circulator: Liborio Nixon, RRT Anesthesiologist/CRNA: Telford Nab, MD/Michael Delice Bison, CRNA Cytotechnology: [facility]: Veryl Speak, Molli Barrows Fluoroscopy technician: Velora Heckler, RT Representatives: Edwena Felty, [facility]; Frutoso Schatz, [employer]  Type of Anesthesia: General endotracheal  Procedures Performed:   Robotic bronchoscopy: Procedure consists of robotic navigation comprised of electromagnetics, optical pattern recognition and robotic kinematic data - to triangulate bronchoscope location during the procedure and provide accurate positional data to biopsy a lesion. Cellvizio probe based confocal laser endomicroscopy (pCLE) utilizing blue laser endomicroscopy. Augmented fluoroscopy with [employer].  Description of Procedure:  Robotic bronchoscopy: The patient was brought  to Procedure Room 2 (Bronchoscopy Suite) in the OR area where appropriate timeout was taken with the staff after the patient was inducted under general anesthesia.  The patient was inducted under general anesthesia and intubated by the anesthesia team.  Patient was intubated with a 8.5 ET tube without difficulty.  Tube was secured at 4 cm above the carina.  A Portex adapter was placed on the ET tube flange.  Once the patient was under adequate general anesthesia the Olympus therapeutic video bronchoscope was advanced and an anatomic airway tour and surveillance bronchoscopy was performed.The distal trachea appeared unremarkable. The main carina was sharp.  No secretions were seen in either right or left mainstem bronchi. The RUL, RLL, RML appeared to be free of endobronchial masses, lesions, or purulent secretions. Likewise, the LLL/LUL appeared to be free of endobronchial masses, lesions, or purulent secretions.  There were some scant benign appearing secretions on the LUL that were suctioned until cleared.  Once the survey bronchoscopy was completed, registration for the augmented fluoroscopy ([employer]) was then performed with the fluoroscopic C arm.  Once this was completed, the robotic bronchoscope ET tube adapter was placed and ETT was cut to proper length and secured on the mid plane.  The [facility] robotic scope was then advanced through the ETT and registration was performed successfully.  There was good correlation between the robotic mapping and bronchoscopic mapping. With the assistance of fused navigation, the bronchoscope was advanced to the LUL nodule/mass. The tip of the working channel sat within 18 mm of the nodule  Positioning was confirmed with augmented fluoroscopy.  At this point Cellvizio probe based confocal laser endomicroscopy (pCLE) was utilized to confirm target acquisition.  The images through [place] were consistent with abnormal tissue changes but not characteristic of  adenocarcinoma, query small cell.   Augmented fluoroscopy via [employer] was utilized to optimize the position most favorable for biopsies, then the robotic bronchoscope was anchored to maintain position.  At this point a pass was made with  a cytology brush and ROSE was performed confirming malignancy.  A total of 4 passes with a cytology brush on the LUL nodule/mass were performed, the brushes were cut into CytoLyt preservative made. After confirmation of excellent hemostasis, eight transbronchial biopsies were obtained at this point, the lesion could be seen moving during biopsies.  A targeted BAL was also obtained at this segment, which was sent for cytology analysis.  For BAL sample acquisition purposes, 40 ml of normal saline were instilled, and approximately 12 mL were recovered/trapped and sent for analysis.   The robotic bronchoscope was then retracted all the way out after confirmation of excellent hemostasis.  After the robotic bronchoscope had been removed, the Olympus EBUS scope was then advanced through the existing ET tube.  Medial area showed and large lymph nodes were precarinal area and left hilar area.  The precarinal area was sampled with the [facility] 22-gauge EBUS Pro core needle.  Only 2 passes could be done as the scope would not maintain good angle of view with the lymph node.  However 2 cores of tissue were obtained and placed on CytoLyt.  Attempt at sampling the left hilar node was limited by poor visualization of the lymph node and adjacent extensive vasculature.  At this point the airway was examined for hemostasis and excellent hemostasis was noted.  At this point the bronchoscope was retrieved and the procedure was terminated.  The patient tolerated the procedure well. No significant bleeding was observed at the conclusion of the procedure.  At this point, the patient was allowed to emerge from general anesthesia, and was extubated in the procedure room without incident.  The patient   was taken to the PACU in satisfactory condition.  Auscultation of the lungs showed no change from pre bronchoscopy examination.  Patient tolerated the procedure very well with no untoward effects of anesthesia noted.   Specimens Obtained:  Transbronchial Forceps Biopsy: X8, LUL  Transbronchial Brush: X4, LUL  Targeted BAL: 12 mL, LUL  EBUS TBNA: X2, precarinal  Fluoroscopy: Augmented fluoroscopy ([employer]) was utilized during the course of this procedure to assure that biopsies were taken in a safe manner under fluoroscopic guidance with spot films required.  Total fluoroscopy time: Less than 5 minutes.  Intraoperative image:    Complications:None, no pneumothorax on post film:   Estimated Blood Loss: Less than 2 mL    Assessment and Plan/Additional Comments: Left upper lobe mass, preliminary ROSE positive for malignancy Mediastinal adenopathy, status post TBNA Await pathology report     C. Derrill Kay, MD Advanced Bronchoscopy PCCM [facility]    *This note was dictated using voice recognition software/Dragon.  Despite best efforts to proofread, errors can occur which can change the meaning.  Any change was purely unintentional.

## 2021-05-25 NOTE — Transfer of Care (Signed)
Immediate Anesthesia Transfer of Care Note  Patient: Jenna Newton  Procedure(s) Performed: ROBOTIC ASSISTED VIDEO BRONCHOSCOPY WITH ENDOBRONCHIAL NAVIGATION VIDEO BRONCHOSCOPY WITH ENDOBRONCHIAL ULTRASOUND  Patient Location: PACU  Anesthesia Type:General  Level of Consciousness: drowsy and patient cooperative  Airway & Oxygen Therapy: Patient Spontanous Breathing  Post-op Assessment: Report given to RN and Post -op Vital signs reviewed and stable  Post vital signs: Reviewed and stable  Last Vitals:  Vitals Value Taken Time  BP    Temp    Pulse 65 05/25/21 1441  Resp 13 05/25/21 1441  SpO2 100 % 05/25/21 1441  Vitals shown include unvalidated device data.  Last Pain:  Vitals:   05/25/21 1107  TempSrc: Temporal  PainSc: 0-No pain         Complications: No notable events documented.

## 2021-05-25 NOTE — Anesthesia Procedure Notes (Signed)
Procedure Name: Intubation Date/Time: 05/25/2021 12:42 PM Performed by: Lia Foyer, CRNA Pre-anesthesia Checklist: Patient identified, Emergency Drugs available, Suction available and Patient being monitored Patient Re-evaluated:Patient Re-evaluated prior to induction Oxygen Delivery Method: Circle system utilized Preoxygenation: Pre-oxygenation with 100% oxygen Induction Type: IV induction Ventilation: Mask ventilation without difficulty Laryngoscope Size: McGraph and 3 Grade View: Grade I Tube type: Oral Tube size: 8.5 mm Number of attempts: 1 Airway Equipment and Method: Stylet and Video-laryngoscopy Placement Confirmation: ETT inserted through vocal cords under direct vision, positive ETCO2 and breath sounds checked- equal and bilateral Secured at: 20.5 cm Tube secured with: Tape Dental Injury: Teeth and Oropharynx as per pre-operative assessment

## 2021-05-25 NOTE — Interval H&P Note (Signed)
History and Physical Interval Note:  05/25/2021 11:50 AM  Jenna Newton  has presented today for surgery, with the diagnosis of LUNG MASS.  The various methods of treatment have been discussed with the patient and family. After consideration of risks, benefits and other options for treatment, the patient has consented to  Procedure(s): ROBOTIC ASSISTED VIDEO BRONCHOSCOPY WITH ENDOBRONCHIAL NAVIGATION (N/A) VIDEO BRONCHOSCOPY WITH ENDOBRONCHIAL ULTRASOUND (N/A) as a surgical intervention.  The patient's history has been reviewed, patient examined, no change in status, stable for surgery.  I have reviewed the patient's chart and labs.  Questions were answered to the patient's satisfaction.     Vernard Gambles

## 2021-05-25 NOTE — Discharge Instructions (Addendum)

## 2021-05-28 ENCOUNTER — Other Ambulatory Visit: Payer: Self-pay | Admitting: Oncology

## 2021-05-28 ENCOUNTER — Telehealth: Payer: Self-pay

## 2021-05-28 DIAGNOSIS — F17210 Nicotine dependence, cigarettes, uncomplicated: Secondary | ICD-10-CM | POA: Diagnosis not present

## 2021-05-28 DIAGNOSIS — C3490 Malignant neoplasm of unspecified part of unspecified bronchus or lung: Secondary | ICD-10-CM

## 2021-05-28 DIAGNOSIS — Z510 Encounter for antineoplastic radiation therapy: Secondary | ICD-10-CM | POA: Diagnosis not present

## 2021-05-28 DIAGNOSIS — C7931 Secondary malignant neoplasm of brain: Secondary | ICD-10-CM | POA: Diagnosis not present

## 2021-05-28 DIAGNOSIS — C3412 Malignant neoplasm of upper lobe, left bronchus or lung: Secondary | ICD-10-CM | POA: Diagnosis not present

## 2021-05-28 NOTE — Progress Notes (Signed)
START ON PATHWAY REGIMEN - Small Cell Lung     Cycles 1 through 4, every 21 days:     Atezolizumab      Carboplatin      Etoposide    Cycles 5 and beyond, every 21 days:     Atezolizumab   **Always confirm dose/schedule in your pharmacy ordering system**  Patient Characteristics: Newly Diagnosed, Preoperative or Nonsurgical Candidate (Clinical Staging), First Line, Extensive Stage Therapeutic Status: Newly Diagnosed, Preoperative or Nonsurgical Candidate (Clinical Staging) AJCC T Category: cT2 AJCC N Category: cN2 AJCC M Category: cM1 AJCC 8 Stage Grouping: IV Stage Classification: Extensive  Intent of Therapy: Non-Curative / Palliative Intent, Discussed with Patient

## 2021-05-28 NOTE — Progress Notes (Signed)
Pharmacist Chemotherapy Monitoring - Initial Assessment    Anticipated start date: 06/04/21   The following has been reviewed per standard work regarding the patient's treatment regimen: The patient's diagnosis, treatment plan and drug doses, and organ/hematologic function Lab orders and baseline tests specific to treatment regimen  The treatment plan start date, drug sequencing, and pre-medications Prior authorization status  Patient's documented medication list, including drug-drug interaction screen and prescriptions for anti-emetics and supportive care specific to the treatment regimen The drug concentrations, fluid compatibility, administration routes, and timing of the medications to be used The patient's access for treatment and lifetime cumulative dose history, if applicable  The patient's medication allergies and previous infusion related reactions, if applicable   Changes made to treatment plan:  treatment plan date  Follow up needed:  Pending authorization for treatment , need to be signed    Adelina Mings, [facility], 05/28/2021  1:35 PM

## 2021-05-28 NOTE — Telephone Encounter (Signed)
Please schedule patient for chemo class carboplatin /etoposide/ tecentriq.   Please schedule Lab/MD/ carbo etoposide next week *new*; etop D2 and D3  Per secure chat from MD: please have her do chemo class of carboplatin + Etoposide + Tecentriq although I plan to leave out tecentriq for her first treatment. since she will be doing RT

## 2021-05-29 ENCOUNTER — Encounter: Payer: Self-pay | Admitting: Pulmonary Disease

## 2021-05-29 LAB — CYTOLOGY - NON PAP

## 2021-05-29 LAB — SURGICAL PATHOLOGY

## 2021-05-30 ENCOUNTER — Ambulatory Visit: Admission: RE | Admit: 2021-05-30 | Payer: BC Managed Care – PPO | Source: Ambulatory Visit

## 2021-05-30 DIAGNOSIS — Z510 Encounter for antineoplastic radiation therapy: Secondary | ICD-10-CM | POA: Diagnosis not present

## 2021-05-30 DIAGNOSIS — F17210 Nicotine dependence, cigarettes, uncomplicated: Secondary | ICD-10-CM | POA: Diagnosis not present

## 2021-05-30 DIAGNOSIS — C3412 Malignant neoplasm of upper lobe, left bronchus or lung: Secondary | ICD-10-CM | POA: Diagnosis not present

## 2021-05-30 DIAGNOSIS — C7931 Secondary malignant neoplasm of brain: Secondary | ICD-10-CM | POA: Diagnosis not present

## 2021-05-31 ENCOUNTER — Encounter: Payer: Self-pay | Admitting: Pulmonary Disease

## 2021-05-31 ENCOUNTER — Ambulatory Visit
Admission: RE | Admit: 2021-05-31 | Discharge: 2021-05-31 | Disposition: A | Discharge status: Home / Self Care | Payer: BC Managed Care – PPO | Source: Ambulatory Visit | Attending: Radiation Oncology | Admitting: Radiation Oncology

## 2021-05-31 ENCOUNTER — Other Ambulatory Visit: Payer: Self-pay

## 2021-05-31 ENCOUNTER — Inpatient Hospital Stay: Payer: BC Managed Care – PPO

## 2021-05-31 DIAGNOSIS — C3490 Malignant neoplasm of unspecified part of unspecified bronchus or lung: Secondary | ICD-10-CM

## 2021-05-31 DIAGNOSIS — Z510 Encounter for antineoplastic radiation therapy: Secondary | ICD-10-CM | POA: Diagnosis not present

## 2021-05-31 DIAGNOSIS — C3412 Malignant neoplasm of upper lobe, left bronchus or lung: Secondary | ICD-10-CM | POA: Diagnosis not present

## 2021-05-31 DIAGNOSIS — C7931 Secondary malignant neoplasm of brain: Secondary | ICD-10-CM | POA: Diagnosis not present

## 2021-06-01 ENCOUNTER — Other Ambulatory Visit: Payer: Self-pay | Admitting: Family Medicine

## 2021-06-01 ENCOUNTER — Encounter: Payer: Self-pay | Admitting: Oncology

## 2021-06-01 ENCOUNTER — Ambulatory Visit
Admission: RE | Admit: 2021-06-01 | Discharge: 2021-06-01 | Disposition: A | Discharge status: Home / Self Care | Payer: BC Managed Care – PPO | Source: Ambulatory Visit | Attending: Radiation Oncology | Admitting: Radiation Oncology

## 2021-06-01 ENCOUNTER — Inpatient Hospital Stay (HOSPITAL_BASED_OUTPATIENT_CLINIC_OR_DEPARTMENT_OTHER): Payer: BC Managed Care – PPO | Admitting: Oncology

## 2021-06-01 ENCOUNTER — Inpatient Hospital Stay: Payer: BC Managed Care – PPO

## 2021-06-01 ENCOUNTER — Encounter: Payer: Self-pay | Admitting: *Deleted

## 2021-06-01 VITALS — BP 116/72 | HR 86 | Temp 97.6°F | Resp 18 | Wt 131.6 lb

## 2021-06-01 DIAGNOSIS — Z7189 Other specified counseling: Secondary | ICD-10-CM | POA: Diagnosis not present

## 2021-06-01 DIAGNOSIS — G939 Disorder of brain, unspecified: Secondary | ICD-10-CM

## 2021-06-01 DIAGNOSIS — J329 Chronic sinusitis, unspecified: Secondary | ICD-10-CM

## 2021-06-01 DIAGNOSIS — C7931 Secondary malignant neoplasm of brain: Secondary | ICD-10-CM | POA: Diagnosis not present

## 2021-06-01 DIAGNOSIS — R569 Unspecified convulsions: Secondary | ICD-10-CM | POA: Diagnosis not present

## 2021-06-01 DIAGNOSIS — Z5111 Encounter for antineoplastic chemotherapy: Secondary | ICD-10-CM | POA: Insufficient documentation

## 2021-06-01 DIAGNOSIS — C3412 Malignant neoplasm of upper lobe, left bronchus or lung: Secondary | ICD-10-CM | POA: Diagnosis not present

## 2021-06-01 DIAGNOSIS — Z7952 Long term (current) use of systemic steroids: Secondary | ICD-10-CM | POA: Diagnosis not present

## 2021-06-01 DIAGNOSIS — M21371 Foot drop, right foot: Secondary | ICD-10-CM | POA: Diagnosis not present

## 2021-06-01 DIAGNOSIS — H1013 Acute atopic conjunctivitis, bilateral: Secondary | ICD-10-CM

## 2021-06-01 DIAGNOSIS — C3490 Malignant neoplasm of unspecified part of unspecified bronchus or lung: Secondary | ICD-10-CM | POA: Diagnosis not present

## 2021-06-01 DIAGNOSIS — Z510 Encounter for antineoplastic radiation therapy: Secondary | ICD-10-CM | POA: Diagnosis not present

## 2021-06-01 DIAGNOSIS — F17210 Nicotine dependence, cigarettes, uncomplicated: Secondary | ICD-10-CM | POA: Diagnosis not present

## 2021-06-01 MED ORDER — PROCHLORPERAZINE MALEATE 10 MG PO TABS: 10.0000 mg | ORAL_TABLET | Freq: Four times a day (QID) | ORAL | 1 refills | Status: DC | PRN

## 2021-06-01 MED ORDER — ONDANSETRON HCL 8 MG PO TABS: 8.0000 mg | ORAL_TABLET | Freq: Two times a day (BID) | ORAL | 1 refills | Status: DC | PRN

## 2021-06-01 NOTE — Anesthesia Postprocedure Evaluation (Signed)
Anesthesia Post Note  Patient: Debara Kamphuis  Procedure(s) Performed: ROBOTIC ASSISTED VIDEO BRONCHOSCOPY WITH ENDOBRONCHIAL NAVIGATION VIDEO BRONCHOSCOPY WITH ENDOBRONCHIAL ULTRASOUND  Patient location during evaluation: PACU Anesthesia Type: General Level of consciousness: awake and alert Pain management: pain level controlled Vital Signs Assessment: post-procedure vital signs reviewed and stable Respiratory status: spontaneous breathing, nonlabored ventilation, respiratory function stable and patient connected to nasal cannula oxygen Cardiovascular status: blood pressure returned to baseline and stable Postop Assessment: no apparent nausea or vomiting Anesthetic complications: no   No notable events documented.   Last Vitals:  Vitals:   05/25/21 1504 05/25/21 1534  BP: 118/65 120/72  Pulse: 67 (!) 57  Resp: 16 18  Temp: (!) 36.2 C   SpO2: 98% 95%    Last Pain:  Vitals:   05/25/21 1534  TempSrc:   PainSc: 0-No pain                 [place]

## 2021-06-01 NOTE — Progress Notes (Signed)
Met with patient during follow up visit with Dr. Tasia Catchings. All questions answered during visit. Reviewed upcoming appts with patient. Pt informed that new prescriptions for nausea medication will be sent to [facility] to pick up prior to chemo on Monday. Instructed pt to call with any questions or needs. Pt verbalized understanding. Nothing further needed at this time.

## 2021-06-01 NOTE — Progress Notes (Signed)
Hematology/Oncology progress note [facility] [phone] [phone]   Patient Care Team: Delsa Grana, PA-C as PCP - General (Family Medicine) Telford Nab, RN as Oncology Nurse Navigator  REFERRING PROVIDER: Delsa Grana, PA-C  CHIEF COMPLAINTS/REASON FOR VISIT:  Extensive small cell lung cancer  HISTORY OF PRESENTING ILLNESS:   Jenna Newton is a  55 y.o.  female with PMH listed below was seen in consultation at the request of  Delsa Grana, PA-C  for evaluation of lung mass  05/08/2021, patient presented to emergency room for evaluation of expressive aphasia.  Acute onset of symptoms.  She/has no other neurological deficits at that time.  In ER, patient was found to have expressive aphasia with intermittent word at rest, though receptive and follows commands. MRI brain with and without contrast showed multiple small peripherally enhancing lesions.  Primary differential consideration is metastatic disease.   Patient declined being admitted.  Was eventually convinced to stay to do CT chest abdomen pelvis with contrast. CT showed a 3.2 cm posterior left upper lobe mass extending from the left hilum, concerning for primary bronchogenic carcinoma.  Enlarged left hilar and prominent bilateral peritracheal lymph nodes are identified.  Worrisome for nodal metastasis.  Similar appearance of bilateral adrenal nodule compared with 12/17/2013.  Favor benign adenomas.  Coronary artery calcifications, enlarged fibroid uterus. Patient prefers to be discharged home.  She was sent on dexamethasone 4 mg twice daily.  And Keppra 500 mg twice daily.  Patient was referred to establish care with cancer center. Today she has no difficulty in speech.  She was accompanied by her cousin. She has some gait change with right foot drop. No additional episodes of expressive aphasia.  Patient drinks alcohol, 12 packs of beer /week, current every day smoker.    INTERVAL  HISTORY Jenna Newton is a 55 y.o. female who has above history reviewed by me today presents for follow up visit for management of extensive small cell lung cancer  05/25/2021 left upper lobe biopsy via bronchoscopy showed small cell lung cancer. left upper lobe brushing/ LUB lavage + small cell lung cancer,  Precarinal lymph node biopsy- not enough tissue  05/30/2021 Patient has started palliative brain RT.   She takes dexamethasone 4mg  daily, as well Keppra 500mg  BID. She feels some stomach discomfort when she takes evening doses.   Review of Systems  Constitutional:  Negative for appetite change, chills, fatigue and fever.  HENT:   Negative for hearing loss and voice change.   Eyes:  Negative for eye problems.  Respiratory:  Negative for chest tightness and cough.   Cardiovascular:  Negative for chest pain.  Gastrointestinal:  Negative for abdominal distention, abdominal pain and blood in stool.  Endocrine: Negative for hot flashes.  Genitourinary:  Negative for difficulty urinating and frequency.   Musculoskeletal:  Positive for gait problem. Negative for arthralgias.  Skin:  Negative for itching and rash.  Neurological:  Positive for gait problem and speech difficulty. Negative for extremity weakness, headaches and seizures.  Hematological:  Negative for adenopathy.  Psychiatric/Behavioral:  Negative for confusion.    MEDICAL HISTORY:  Past Medical History:  Diagnosis Date   Anemia    GERD (gastroesophageal reflux disease)    Hypertension    Neuropathy    Tobacco abuse 03/14/2017   Vertigo    White matter disease of brain due to ischemia 12/04/2017   Head CT March 2019    SURGICAL HISTORY: Past Surgical History:  Procedure Laterality Date  none     VIDEO BRONCHOSCOPY WITH ENDOBRONCHIAL NAVIGATION N/A 05/25/2021   Procedure: ROBOTIC ASSISTED VIDEO BRONCHOSCOPY WITH ENDOBRONCHIAL NAVIGATION;  Surgeon: Tyler Pita, MD;  Location: [facility] ORS;  Service: Pulmonary;   Laterality: N/A;   VIDEO BRONCHOSCOPY WITH ENDOBRONCHIAL ULTRASOUND N/A 05/25/2021   Procedure: VIDEO BRONCHOSCOPY WITH ENDOBRONCHIAL ULTRASOUND;  Surgeon: Tyler Pita, MD;  Location: [facility] ORS;  Service: Pulmonary;  Laterality: N/A;    SOCIAL HISTORY: Social History   Socioeconomic History   Marital status: Single    Spouse name: Not on file   Number of children: 0   Years of education: Not on file   Highest education level: Bachelor's degree (e.g., BA, AB, BS)  Occupational History   Not on file  Tobacco Use   Smoking status: Every Day    Packs/day: 1.00    Years: 25.00    Pack years: 25.00    Types: Cigarettes   Smokeless tobacco: Never  Vaping Use   Vaping Use: Never used  Substance and Sexual Activity   Alcohol use: Yes    Alcohol/week: 14.0 standard drinks    Types: 14 Cans of beer per week    Comment: 12 pack/ week   Drug use: No   Sexual activity: Yes    Partners: Male    Birth control/protection: Post-menopausal  Other Topics Concern   Not on file  Social History Narrative   Lives with brother   Social Determinants of Health   Financial Resource Strain: Not on file  Food Insecurity: Not on file  Transportation Needs: Not on file  Physical Activity: Not on file  Stress: Not on file  Social Connections: Not on file  Intimate Partner Violence: Not on file    FAMILY HISTORY: Family History  Problem Relation Age of Onset   Diabetes Mother    Hypertension Mother    Emphysema Mother    Emphysema Father    Diabetes Brother    Alcohol abuse Brother    Diabetes Maternal Grandmother    Hypertension Maternal Grandmother    Blindness Maternal Grandmother    Cancer Maternal Grandfather        prostate   Emphysema Maternal Grandfather    Cancer Paternal Grandmother    Emphysema Paternal Grandfather     ALLERGIES:  is allergic to penicillins, meclizine, and sulfa antibiotics.  MEDICATIONS:  Current Outpatient Medications  Medication Sig Dispense  Refill   amLODipine (NORVASC) 5 MG tablet Take 1 tablet (5 mg total) by mouth daily. 90 tablet 3   cromolyn (OPTICROM) 4 % ophthalmic solution Place 1 drop into both eyes 4 (four) times daily as needed. 10 mL 3   dexamethasone (DECADRON) 4 MG tablet Take 1 tablet (4 mg total) by mouth daily. 30 tablet 0   fluticasone (FLONASE) 50 MCG/ACT nasal spray Place 1 spray into both nostrils daily as needed for allergies.     levETIRAcetam (KEPPRA) 500 MG tablet Take 1 tablet (500 mg total) by mouth 2 (two) times daily. 120 tablet 0   montelukast (SINGULAIR) 10 MG tablet Take 1 tablet (10 mg total) by mouth at bedtime. 90 tablet 3   Tiotropium Bromide-Olodaterol (STIOLTO RESPIMAT) 2.5-2.5 MCG/ACT AERS Inhale 2 puffs into the lungs daily. (Patient taking differently: Inhale 2 puffs into the lungs 2 (two) times daily.) 1 each 0   vitamin B-12 (CYANOCOBALAMIN) 500 MCG tablet Take 1 tablet (500 mcg total) by mouth daily. 100 tablet 3   Aspirin-Caffeine (BC FAST PAIN RELIEF PO) Take 1  packet by mouth daily as needed (pain). (Patient not taking: Reported on 06/01/2021)     meclizine (ANTIVERT) 25 MG tablet Take 1 tablet (25 mg total) by mouth 3 (three) times daily as needed for dizziness (vertigo). (Patient not taking: No sig reported) 90 tablet 11   ondansetron (ZOFRAN) 8 MG tablet Take 1 tablet (8 mg total) by mouth 2 (two) times daily as needed for refractory nausea / vomiting. Start on day 3 after carboplatin chemo. 30 tablet 1   prochlorperazine (COMPAZINE) 10 MG tablet Take 1 tablet (10 mg total) by mouth every 6 (six) hours as needed (Nausea or vomiting). 30 tablet 1   saline (AYR) GEL Place 1 application into the nose every 4 (four) hours. (Patient not taking: No sig reported) 14.1 g 3   No current facility-administered medications for this visit.     PHYSICAL EXAMINATION: ECOG PERFORMANCE STATUS: 1 - Symptomatic but completely ambulatory Vitals:   06/01/21 1007  BP: 116/72  Pulse: 86  Resp: 18   Temp: 97.6 F (36.4 C)   Filed Weights   06/01/21 1007  Weight: 131 lb 9.6 oz (59.7 kg)    Physical Exam Constitutional:      General: She is not in acute distress. HENT:     Head: Normocephalic and atraumatic.  Eyes:     General: No scleral icterus. Cardiovascular:     Rate and Rhythm: Normal rate and regular rhythm.     Heart sounds: Normal heart sounds.  Pulmonary:     Effort: Pulmonary effort is normal. No respiratory distress.     Breath sounds: No wheezing.  Abdominal:     General: Bowel sounds are normal. There is no distension.     Palpations: Abdomen is soft.  Musculoskeletal:        General: No deformity. Normal range of motion.     Cervical back: Normal range of motion and neck supple.     Comments: Steppage gait due to right foot drop, decreased  dorsiflexion   Skin:    General: Skin is warm and dry.     Findings: No erythema or rash.  Neurological:     Mental Status: She is alert and oriented to person, place, and time. Mental status is at baseline.     Cranial Nerves: No cranial nerve deficit.     Coordination: Coordination normal.  Psychiatric:        Mood and Affect: Mood normal.    LABORATORY DATA:  I have reviewed the data as listed Lab Results  Component Value Date   WBC 7.5 05/08/2021   HGB 13.9 05/08/2021   HCT 40.9 05/08/2021   MCV 85.9 05/08/2021   PLT 284 05/08/2021   Recent Labs    05/08/21 1310  NA 135  K 4.4  CL 99  CO2 25  GLUCOSE 114*  BUN 13  CREATININE 0.45  CALCIUM 9.4  GFRNONAA >60  PROT 8.1  ALBUMIN 4.4  AST 35  ALT 30  ALKPHOS 98  BILITOT 1.2    Iron/TIBC/Ferritin/ %Sat    Component Value Date/Time   FERRITIN 126 03/14/2017 0926       RADIOGRAPHIC STUDIES: I have personally reviewed the radiological images as listed and agreed with the findings in the report. MR Brain W and Wo Contrast  Result Date: 05/08/2021 CLINICAL DATA:  Brain mass or lesion EXAM: MRI HEAD WITHOUT AND WITH CONTRAST TECHNIQUE:  Multiplanar, multiecho pulse sequences of the brain and surrounding structures were obtained without and with  intravenous contrast. CONTRAST:  62mL GADAVIST GADOBUTROL 1 MMOL/ML IV SOLN COMPARISON:  CT head earlier same day FINDINGS: Brain: Multiple (greater than 10) peripherally enhancing lesions are identified in bilateral cerebral hemispheres. No definite involvement of brainstem or cerebellum. Largest in the right corona radiata measures 1 cm and corresponds to one of the hyperdense lesions seen on CT. There is diffusion hyperintensity at the periphery. Mild edema associated with some of the lesions. There is no acute infarction or intracranial hemorrhage. No significant mass effect. No hydrocephalus or extra-axial collection. Vascular: Major vessel flow voids at the skull base are preserved. Skull and upper cervical spine: Normal marrow signal is preserved. Sinuses/Orbits: Paranasal sinuses are aerated. Orbits are unremarkable. Other: Sella is unremarkable.  Mastoid air cells are clear. IMPRESSION: Multiple small peripherally enhancing lesions. Primary differential consideration is metastatic disease. Septic emboli is another consideration in the appropriate setting. Electronically Signed   By: Macy Mis M.D.   On: 05/08/2021 15:13   CT CHEST ABDOMEN PELVIS W CONTRAST  Result Date: 05/08/2021 CLINICAL DATA:  Cancer of unknown primary. Patient presents with multiple brain lesions. EXAM: CT CHEST, ABDOMEN, AND PELVIS WITH CONTRAST TECHNIQUE: Multidetector CT imaging of the chest, abdomen and pelvis was performed following the standard protocol during bolus administration of intravenous contrast. CONTRAST:  44mL OMNIPAQUE IOHEXOL 350 MG/ML SOLN COMPARISON:  None. FINDINGS: CT CHEST FINDINGS Cardiovascular: Heart size is normal. Aortic atherosclerosis. No pericardial effusion. Coronary artery calcifications. Mediastinum/Nodes: Normal appearance of the thyroid gland. The trachea appears patent and is midline.  Normal appearance of the esophagus. No enlarged supraclavicular or axillary lymph nodes. Prominent left paratracheal lymph node measures 1.1 cm, image 23/2. Prominent right paratracheal lymph node measures 1 cm, image 23/2. Enlarged left hilar lymph nodes are identified measuring up to 1.3 cm, image 61/4, image 26/2 and image 24/2 Lungs/Pleura: No pleural effusion. Centrilobular emphysema. Elongated mass within the posterior left upper lobe extends from the left hilum. This is best seen on the sagittal images where the mass measures 3.2 cm in length with a maximum diameter of 1.2 cm, image 107/5. No other pulmonary nodule or mass identified. Musculoskeletal: No chest wall mass or suspicious bone lesions identified. CT ABDOMEN PELVIS FINDINGS Hepatobiliary: No focal liver abnormality is seen. No gallstones, gallbladder wall thickening, or biliary dilatation. Pancreas: Unremarkable. No pancreatic ductal dilatation or surrounding inflammatory changes. Spleen: Normal in size without focal abnormality. Adrenals/Urinary Tract: Bilateral adrenal nodules are noted in appears similar to study from 12/17/2013. The largest is on the right measuring 2.4 x 1.9 cm, image 61/2. On 12/17/2013 this measured 2.5 x 1.7 cm. No kidney mass or hydronephrosis. Bladder unremarkable. Stomach/Bowel: Stomach is within normal limits. No evidence of bowel wall thickening, distention, or inflammatory changes. Vascular/Lymphatic: Aortic atherosclerosis. No aneurysm. No abdominopelvic adenopathy. Reproductive: Enlarged partially calcified fibroid uterus is identified which measures 10.4 by 9.8 x 9.5 cm. No adnexal mass identified. Other: No free fluid or fluid collections. Musculoskeletal: No acute or significant osseous findings. IMPRESSION: 1. 3.2 cm posterior left upper lobe lung mass extends from the left hilum and is concerning for primary bronchogenic carcinoma. Further evaluation with PET-CT and tissue sampling advised. 2. Enlarged left  hilar and prominent bilateral paratracheal lymph nodes are identified. Worrisome for nodal metastasis. 3. Similar appearance of bilateral adrenal nodules compared with 12/17/2013. Favor benign adenomas. 4. Coronary artery calcifications noted. 5. Enlarged fibroid uterus. Aortic Atherosclerosis (ICD10-I70.0) and Emphysema (ICD10-J43.9). Electronically Signed   By: Kerby Moors M.D.   On: 05/08/2021  16:55   DG Chest Port 1 View  Result Date: 05/25/2021 CLINICAL DATA:  Status post biopsy left upper lobe mass EXAM: PORTABLE CHEST 1 VIEW COMPARISON:  04/10/2018, CT 05/24/2021 FINDINGS: No acute airspace disease or pleural effusion is seen. Normal cardiac size with aortic atherosclerosis. No visible pneumothorax. Left upper lobe lung nodule is better seen on CT. Left hilar opacity consistent with enlarged hilar lymph nodes on CT. Emphysema IMPRESSION: 1. Negative for pneumothorax. 2. Emphysema. 3. Left upper lobe lung mass is better seen on CT. Asymmetric opacity in the left hilus corresponding to lymph nodes on CT Electronically Signed   By: Donavan Foil M.D.   On: 05/25/2021 16:34   DG C-Arm 1-60 Min-No Report  Result Date: 05/25/2021 Fluoroscopy was utilized by the requesting physician.  No radiographic interpretation.   CT HEAD CODE STROKE WO CONTRAST  Result Date: 05/08/2021 CLINICAL DATA:  Code stroke. EXAM: CT HEAD WITHOUT CONTRAST TECHNIQUE: Contiguous axial images were obtained from the base of the skull through the vertex without intravenous contrast. COMPARISON:  2019 FINDINGS: Brain: There is small area of ill-defined hyperdensity in the right corona radiata (series 4, image 16). Additional small areas present in the left parietal lobe involving cortex (image 23). Possible small area in the anterior right frontal white matter (image 13). Small area of low attenuation in the left frontal subcortical white matter could reflect chronic microvascular ischemic change or edema. Gray-white  differentiation is preserved. Vascular: No hyperdense vessel. Intracranial atherosclerotic calcification at the skull base. Skull: Unremarkable. Sinuses/Orbits: No acute abnormality. Other: Mastoid air cells are clear. ASPECTS ([place] Stroke Program Early CT Score) - Ganglionic level infarction (caudate, lentiform nuclei, internal capsule, insula, M1-M3 cortex): 7 - Supraganglionic infarction (M4-M6 cortex): 3 Total score (0-10 with 10 being normal): 10 IMPRESSION: Small ill-defined hyperdensity in the right corona radiata, left parietal lobe, and possibly in the anterior right frontal white matter. Small areas of acute hemorrhage are possible, but MRI is recommended to evaluate for underlying lesions. No evidence of acute infarction. These results were called by telephone at the time of interpretation on 05/08/2021 at 1:04 pm to provider PHILLIP STAFFORD , who verbally acknowledged these results. Electronically Signed   By: Macy Mis M.D.   On: 05/08/2021 13:11   CT SUPER D CHEST WO CONTRAST  Result Date: 05/25/2021 CLINICAL DATA:  Metoprolol lesion. EXAM: CT CHEST WITHOUT CONTRAST TECHNIQUE: Multidetector CT imaging of the chest was performed using thin slice collimation for electromagnetic bronchoscopy planning purposes, without intravenous contrast. COMPARISON:  CT chest, abdomen and pelvis dated May 08, 2021 FINDINGS: Cardiovascular: Normal heart size. Three-vessel coronary artery calcifications. Atherosclerotic disease of thoracic aorta. Mediastinum/Nodes: No pathologically enlarged lymph nodes seen in the chest. Normal esophagus. Lungs/Pleura: Central airways are patent. Unchanged tubular nodule of the left upper lobe measuring 3.1 x 1.1 cm on sagittal series. Moderate centrilobular emphysema. No pleural effusion or pneumothorax. Upper Abdomen: Bilateral adrenal nodules, better evaluated on recent CT. No acute abnormalities. Musculoskeletal: No chest wall mass or suspicious bone lesions  identified. IMPRESSION: Unchanged size of left upper lobe tubular nodule. Correlate with scheduled transbronchial biopsy. Aortic Atherosclerosis (ICD10-I70.0) and Emphysema (ICD10-J43.9). Electronically Signed   By: Yetta Glassman M.D.   On: 05/25/2021 15:21      ASSESSMENT & PLAN:  1. Small cell lung cancer ([place])   2. Goals of care, counseling/discussion   3. Encounter for antineoplastic chemotherapy   4. Brain lesion   5. Seizure [facility])   Cancer Staging Small  cell lung cancer [facility]) Staging form: Lung, AJCC 8th Edition - Clinical stage from 06/01/2021: Stage IV (cT2a, cN3, cM1) - Signed by Earlie Server, MD on 06/01/2021  # Extensive small cell cancer, brain metastatic disease Continue Dexamethsone 4mg  daily, Keppra 500mg  BID. Advise patient to stop driving Pathology reports was discussed with patient.  The diagnosis and care plan were discussed with patient in detail.  NCCN guidelines were reviewed and shared with patient.  The goal of treatment which is to palliate disease, disease related symptoms, improve quality of life and hopefully prolong life was highlighted in our discussion.  I recommend carboplatin + etoposide + tecentriq.  Chemotherapy education was provided.  We had discussed the composition of chemotherapy regimen, length of chemo cycle, duration of treatment and the time to assess response to treatment.    I explained to the patient the risks and benefits of chemotherapy including all but not limited to hair loss, mouth sore, nausea, vomiting, diarrhea, low blood counts, bleeding, neuropathy and risk of life threatening infection and even death, secondary malignancy etc.   I discussed the mechanism of action and rationale of using immunotherapy.  The goal of therapy is palliative; and length of treatments are likely ongoing/based upon the results of the scans. Discussed the potential side effects of immunotherapy including but not limited to diarrhea; skin rash; respiratory failure,  kidney failure, mental status change, elevated LFTs/liver failure,endocrine abnormalities, acute deterioration  and even death,etc. patient voices understanding and agrees with proceeding with treatment.  # Chemotherapy education; refer to vascular surgeon for Medi- port placement. Antiemetics-Zofran and Compazine; EMLA cream sent to pharmacy  Supportive care measures are necessary for patient well-being and will be provided as necessary. We spent sufficient time to discuss many aspect of care, questions were answered to patient's satisfaction.  Check lab cbc cmp on 06/04/2021 and proceed with cycle 1 Carboplatin and Etoposide. Hold Tecentriq due to radiation.   Follow up 1 week after chemo for evaluation of tolerability  Orders Placed This Encounter  Procedures   CBC with Differential    Standing Status:   Standing    Number of Occurrences:   20    Standing Expiration Date:   06/01/2022   Comprehensive metabolic panel    Standing Status:   Standing    Number of Occurrences:   20    Standing Expiration Date:   06/01/2022   Ambulatory referral to Vascular Surgery    Referral Priority:   Routine    Referral Type:   Surgical    Referral Reason:   Specialty Services Required    Requested Specialty:   Vascular Surgery    Number of Visits Requested:   1   PHYSICIAN COMMUNICATION ORDER    Thyroid function tests at baseline and every 3rd cycle    All questions were answered. The patient knows to call the clinic with any problems questions or concerns.  cc Delsa Grana, PA-C     Earlie Server, MD, PhD Hematology Oncology [facility] at [facility]  06/01/2021

## 2021-06-01 NOTE — Progress Notes (Signed)
Patient reports that she had stomach pains with the pm dose of Keppra so she has been holding the pm dose fir 1 week.

## 2021-06-04 ENCOUNTER — Inpatient Hospital Stay: Payer: BC Managed Care – PPO

## 2021-06-04 ENCOUNTER — Encounter: Payer: Self-pay | Admitting: *Deleted

## 2021-06-04 ENCOUNTER — Ambulatory Visit
Admission: RE | Admit: 2021-06-04 | Discharge: 2021-06-04 | Disposition: A | Discharge status: Home / Self Care | Payer: BC Managed Care – PPO | Source: Ambulatory Visit | Attending: Radiation Oncology | Admitting: Radiation Oncology

## 2021-06-04 ENCOUNTER — Encounter: Payer: Self-pay | Admitting: Oncology

## 2021-06-04 ENCOUNTER — Inpatient Hospital Stay: Payer: BC Managed Care – PPO | Admitting: Oncology

## 2021-06-04 VITALS — BP 118/74 | HR 80 | Temp 97.0°F | Resp 18

## 2021-06-04 DIAGNOSIS — C3412 Malignant neoplasm of upper lobe, left bronchus or lung: Secondary | ICD-10-CM | POA: Insufficient documentation

## 2021-06-04 DIAGNOSIS — F17210 Nicotine dependence, cigarettes, uncomplicated: Secondary | ICD-10-CM | POA: Insufficient documentation

## 2021-06-04 DIAGNOSIS — D6481 Anemia due to antineoplastic chemotherapy: Secondary | ICD-10-CM | POA: Insufficient documentation

## 2021-06-04 DIAGNOSIS — Z7952 Long term (current) use of systemic steroids: Secondary | ICD-10-CM | POA: Insufficient documentation

## 2021-06-04 DIAGNOSIS — M21371 Foot drop, right foot: Secondary | ICD-10-CM | POA: Insufficient documentation

## 2021-06-04 DIAGNOSIS — C3490 Malignant neoplasm of unspecified part of unspecified bronchus or lung: Secondary | ICD-10-CM

## 2021-06-04 DIAGNOSIS — Z510 Encounter for antineoplastic radiation therapy: Secondary | ICD-10-CM | POA: Insufficient documentation

## 2021-06-04 DIAGNOSIS — C7931 Secondary malignant neoplasm of brain: Secondary | ICD-10-CM | POA: Insufficient documentation

## 2021-06-04 DIAGNOSIS — Z5112 Encounter for antineoplastic immunotherapy: Secondary | ICD-10-CM | POA: Insufficient documentation

## 2021-06-04 DIAGNOSIS — Z79899 Other long term (current) drug therapy: Secondary | ICD-10-CM | POA: Insufficient documentation

## 2021-06-04 DIAGNOSIS — R569 Unspecified convulsions: Secondary | ICD-10-CM | POA: Insufficient documentation

## 2021-06-04 LAB — CBC WITH DIFFERENTIAL/PLATELET
Abs Immature Granulocytes: 0.03 10*3/uL (ref 0.00–0.07)
Basophils Absolute: 0 10*3/uL (ref 0.0–0.1)
Basophils Relative: 0 %
Eosinophils Absolute: 0 10*3/uL (ref 0.0–0.5)
Eosinophils Relative: 0 %
HCT: 37.5 % (ref 36.0–46.0)
Hemoglobin: 12.2 g/dL (ref 12.0–15.0)
Immature Granulocytes: 0 %
Lymphocytes Relative: 19 %
Lymphs Abs: 1.9 10*3/uL (ref 0.7–4.0)
MCH: 29.5 pg (ref 26.0–34.0)
MCHC: 32.5 g/dL (ref 30.0–36.0)
MCV: 90.6 fL (ref 80.0–100.0)
Monocytes Absolute: 0.4 10*3/uL (ref 0.1–1.0)
Monocytes Relative: 4 %
Neutro Abs: 7.9 10*3/uL — ABNORMAL HIGH (ref 1.7–7.7)
Neutrophils Relative %: 77 %
Platelets: 333 10*3/uL (ref 150–400)
RBC: 4.14 MIL/uL (ref 3.87–5.11)
RDW: 17.5 % — ABNORMAL HIGH (ref 11.5–15.5)
WBC: 10.2 10*3/uL (ref 4.0–10.5)
nRBC: 0 % (ref 0.0–0.2)

## 2021-06-04 LAB — COMPREHENSIVE METABOLIC PANEL
ALT: 24 U/L (ref 0–44)
AST: 21 U/L (ref 15–41)
Albumin: 4 g/dL (ref 3.5–5.0)
Alkaline Phosphatase: 51 U/L (ref 38–126)
Anion gap: 9 (ref 5–15)
BUN: 11 mg/dL (ref 6–20)
CO2: 30 mmol/L (ref 22–32)
Calcium: 9.1 mg/dL (ref 8.9–10.3)
Chloride: 97 mmol/L — ABNORMAL LOW (ref 98–111)
Creatinine, Ser: 0.37 mg/dL — ABNORMAL LOW (ref 0.44–1.00)
GFR, Estimated: >60 mL/min (ref >60)
Glucose, Bld: 109 mg/dL — ABNORMAL HIGH (ref 70–99)
Potassium: 4 mmol/L (ref 3.5–5.1)
Sodium: 136 mmol/L (ref 135–145)
Total Bilirubin: 0.4 mg/dL (ref 0.3–1.2)
Total Protein: 7.1 g/dL (ref 6.5–8.1)

## 2021-06-04 MED ORDER — SODIUM CHLORIDE 0.9 % IV SOLN
490.0000 mg | Freq: Once | INTRAVENOUS | Status: AC
  Administered 2021-06-04: 490 mg via INTRAVENOUS
  Filled 2021-06-04: qty 49

## 2021-06-04 MED ORDER — PALONOSETRON HCL INJECTION 0.25 MG/5ML
0.2500 mg | Freq: Once | INTRAVENOUS | Status: AC
  Administered 2021-06-04: 0.25 mg via INTRAVENOUS
  Filled 2021-06-04: qty 5

## 2021-06-04 MED ORDER — SODIUM CHLORIDE 0.9 % IV SOLN
100.0000 mg/m2 | Freq: Once | INTRAVENOUS | Status: AC
  Administered 2021-06-04: 170 mg via INTRAVENOUS
  Filled 2021-06-04: qty 8.5

## 2021-06-04 MED ORDER — SODIUM CHLORIDE 0.9 % IV SOLN
150.0000 mg | Freq: Once | INTRAVENOUS | Status: AC
  Administered 2021-06-04: 150 mg via INTRAVENOUS
  Filled 2021-06-04: qty 150

## 2021-06-04 MED ORDER — SODIUM CHLORIDE 0.9 % IV SOLN
10.0000 mg | Freq: Once | INTRAVENOUS | Status: AC
  Administered 2021-06-04: 10 mg via INTRAVENOUS
  Filled 2021-06-04: qty 10

## 2021-06-04 MED ORDER — SODIUM CHLORIDE 0.9 % IV SOLN
Freq: Once | INTRAVENOUS | Status: AC
  Filled 2021-06-04: qty 250

## 2021-06-04 NOTE — Addendum Note (Signed)
Addended by: Earlie Server on: 06/04/2021 10:02 AM   Modules accepted: Orders

## 2021-06-04 NOTE — Progress Notes (Signed)
Met with patient during infusion. All questions answered during visit. Pt given print out of scheduled appts. Instructed to call with any questions or needs. Pt verbalized understanding.

## 2021-06-05 ENCOUNTER — Encounter (INDEPENDENT_AMBULATORY_CARE_PROVIDER_SITE_OTHER): Payer: Self-pay

## 2021-06-05 ENCOUNTER — Ambulatory Visit
Admission: RE | Admit: 2021-06-05 | Discharge: 2021-06-05 | Disposition: A | Discharge status: Home / Self Care | Payer: BC Managed Care – PPO | Source: Ambulatory Visit | Attending: Radiation Oncology | Admitting: Radiation Oncology

## 2021-06-05 ENCOUNTER — Telehealth: Payer: Self-pay

## 2021-06-05 ENCOUNTER — Inpatient Hospital Stay: Payer: BC Managed Care – PPO

## 2021-06-05 VITALS — BP 114/74 | HR 85 | Temp 97.1°F | Resp 18

## 2021-06-05 DIAGNOSIS — C3490 Malignant neoplasm of unspecified part of unspecified bronchus or lung: Secondary | ICD-10-CM

## 2021-06-05 DIAGNOSIS — C3412 Malignant neoplasm of upper lobe, left bronchus or lung: Secondary | ICD-10-CM | POA: Diagnosis not present

## 2021-06-05 DIAGNOSIS — Z510 Encounter for antineoplastic radiation therapy: Secondary | ICD-10-CM | POA: Diagnosis not present

## 2021-06-05 DIAGNOSIS — C7931 Secondary malignant neoplasm of brain: Secondary | ICD-10-CM | POA: Diagnosis not present

## 2021-06-05 MED ORDER — SODIUM CHLORIDE 0.9 % IV SOLN
10.0000 mg | Freq: Once | INTRAVENOUS | Status: AC
  Administered 2021-06-05: 10 mg via INTRAVENOUS
  Filled 2021-06-05: qty 10

## 2021-06-05 MED ORDER — SODIUM CHLORIDE 0.9 % IV SOLN
100.0000 mg/m2 | Freq: Once | INTRAVENOUS | Status: AC
  Administered 2021-06-05: 170 mg via INTRAVENOUS
  Filled 2021-06-05: qty 8.5

## 2021-06-05 MED ORDER — SODIUM CHLORIDE 0.9 % IV SOLN
Freq: Once | INTRAVENOUS | Status: AC
  Filled 2021-06-05: qty 250

## 2021-06-05 NOTE — Telephone Encounter (Signed)
Telephone call to patient for follow up after receiving first infusion.   No answer but left message stating we were calling to check on them.  Encouraged patient to call for any questions or concerns.   

## 2021-06-05 NOTE — Progress Notes (Signed)
Per Sonia Baller in pharmacy, Etopside can be given now since patient received Etoposide on day 1 at 1237.

## 2021-06-06 ENCOUNTER — Ambulatory Visit
Admission: RE | Admit: 2021-06-06 | Discharge: 2021-06-06 | Disposition: A | Discharge status: Home / Self Care | Payer: BC Managed Care – PPO | Source: Ambulatory Visit | Attending: Radiation Oncology | Admitting: Radiation Oncology

## 2021-06-06 ENCOUNTER — Inpatient Hospital Stay: Payer: BC Managed Care – PPO

## 2021-06-06 VITALS — BP 106/61 | HR 75 | Temp 99.3°F | Resp 16

## 2021-06-06 DIAGNOSIS — F17210 Nicotine dependence, cigarettes, uncomplicated: Secondary | ICD-10-CM | POA: Diagnosis not present

## 2021-06-06 DIAGNOSIS — C7931 Secondary malignant neoplasm of brain: Secondary | ICD-10-CM | POA: Diagnosis not present

## 2021-06-06 DIAGNOSIS — C3490 Malignant neoplasm of unspecified part of unspecified bronchus or lung: Secondary | ICD-10-CM

## 2021-06-06 DIAGNOSIS — Z510 Encounter for antineoplastic radiation therapy: Secondary | ICD-10-CM | POA: Diagnosis not present

## 2021-06-06 DIAGNOSIS — C3412 Malignant neoplasm of upper lobe, left bronchus or lung: Secondary | ICD-10-CM | POA: Diagnosis not present

## 2021-06-06 MED ORDER — SODIUM CHLORIDE 0.9 % IV SOLN
10.0000 mg | Freq: Once | INTRAVENOUS | Status: AC
  Administered 2021-06-06: 10 mg via INTRAVENOUS
  Filled 2021-06-06: qty 10

## 2021-06-06 MED ORDER — SODIUM CHLORIDE 0.9 % IV SOLN
100.0000 mg/m2 | Freq: Once | INTRAVENOUS | Status: AC
  Administered 2021-06-06: 170 mg via INTRAVENOUS
  Filled 2021-06-06: qty 8.5

## 2021-06-06 MED ORDER — SODIUM CHLORIDE 0.9 % IV SOLN
Freq: Once | INTRAVENOUS | Status: AC
  Filled 2021-06-06: qty 250

## 2021-06-06 NOTE — Progress Notes (Signed)
Pt tolerated all infusions well today with no problems or complaints.  Pt left infusion suite stable and ambulatory.

## 2021-06-06 NOTE — Patient Instructions (Signed)
[facility]  Discharge Instructions: Thank you for choosing [facility] to provide your oncology and hematology care.  If you have a lab appointment with the [facility], please go directly to the [facility] and check in at the registration area.  Wear comfortable clothing and clothing appropriate for easy access to any Portacath or PICC line.   We strive to give you quality time with your provider. You may need to reschedule your appointment if you arrive late (15 or more minutes).  Arriving late affects you and other patients whose appointments are after yours.  Also, if you miss three or more appointments without notifying the office, you may be dismissed from the clinic at the provider's discretion.      For prescription refill requests, have your pharmacy contact our office and allow 72 hours for refills to be completed.    Today you received the following chemotherapy and/or immunotherapy agents etoposide      To help prevent nausea and vomiting after your treatment, we encourage you to take your nausea medication as directed.  BELOW ARE SYMPTOMS THAT SHOULD BE REPORTED IMMEDIATELY: *FEVER GREATER THAN 100.4 F (38 C) OR HIGHER *CHILLS OR SWEATING *NAUSEA AND VOMITING THAT IS NOT CONTROLLED WITH YOUR NAUSEA MEDICATION *UNUSUAL SHORTNESS OF BREATH *UNUSUAL BRUISING OR BLEEDING *URINARY PROBLEMS (pain or burning when urinating, or frequent urination) *BOWEL PROBLEMS (unusual diarrhea, constipation, pain near the anus) TENDERNESS IN MOUTH AND THROAT WITH OR WITHOUT PRESENCE OF ULCERS (sore throat, sores in mouth, or a toothache) UNUSUAL RASH, SWELLING OR PAIN  UNUSUAL VAGINAL DISCHARGE OR ITCHING   Items with * indicate a potential emergency and should be followed up as soon as possible or go to the Emergency Department if any problems should occur.  Please show the CHEMOTHERAPY ALERT CARD or IMMUNOTHERAPY ALERT CARD at check-in  to the Emergency Department and triage nurse.  Should you have questions after your visit or need to cancel or reschedule your appointment, please contact [facility]  [phone] and follow the prompts.  Office hours are 8:00 a.m. to 4:30 p.m. Monday - Friday. Please note that voicemails left after 4:00 p.m. may not be returned until the following business day.  We are closed weekends and major holidays. You have access to a nurse at all times for urgent questions. Please call the main number to the clinic [phone] and follow the prompts.  For any non-urgent questions, you may also contact your provider using MyChart. We now offer e-Visits for anyone 16 and older to request care online for non-urgent symptoms. For details visit [email or web address].   Also download the MyChart app! Go to the app store, search "MyChart", open the app, select [facility], and log in with your MyChart username and password.  Due to Covid, a mask is required upon entering the hospital/clinic. If you do not have a mask, one will be given to you upon arrival. For doctor visits, patients may have 1 support person aged 65 or older with them. For treatment visits, patients cannot have anyone with them due to current Covid guidelines and our immunocompromised population.   Etoposide, VP-16 injection What is this medication? ETOPOSIDE, VP-16 (e toe POE side) is a chemotherapy drug. It is used to treat testicular cancer, lung cancer, and other cancers. This medicine may be used for other purposes; ask your health care provider or pharmacist if you have questions. COMMON BRAND NAME(S): Etopophos, Toposar, VePesid What  should I tell my care team before I take this medication? They need to know if you have any of these conditions: infection kidney disease liver disease low blood counts, like low white cell, platelet, or red cell counts an unusual or allergic reaction to etoposide,  other medicines, foods, dyes, or preservatives pregnant or trying to get pregnant breast-feeding How should I use this medication? This medicine is for infusion into a vein. It is administered in a hospital or clinic by a specially trained health care professional. Talk to your pediatrician regarding the use of this medicine in children. Special care may be needed. Overdosage: If you think you have taken too much of this medicine contact a poison control center or emergency room at once. NOTE: This medicine is only for you. Do not share this medicine with others. What if I miss a dose? It is important not to miss your dose. Call your doctor or health care professional if you are unable to keep an appointment. What may interact with this medication? This medicine may interact with the following medications: warfarin This list may not describe all possible interactions. Give your health care provider a list of all the medicines, herbs, non-prescription drugs, or dietary supplements you use. Also tell them if you smoke, drink alcohol, or use illegal drugs. Some items may interact with your medicine. What should I watch for while using this medication? Visit your doctor for checks on your progress. This drug may make you feel generally unwell. This is not uncommon, as chemotherapy can affect healthy cells as well as cancer cells. Report any side effects. Continue your course of treatment even though you feel ill unless your doctor tells you to stop. In some cases, you may be given additional medicines to help with side effects. Follow all directions for their use. Call your doctor or health care professional for advice if you get a fever, chills or sore throat, or other symptoms of a cold or flu. Do not treat yourself. This drug decreases your body's ability to fight infections. Try to avoid being around people who are sick. This medicine may increase your risk to bruise or bleed. Call your doctor or  health care professional if you notice any unusual bleeding. Talk to your doctor about your risk of cancer. You may be more at risk for certain types of cancers if you take this medicine. Do not become pregnant while taking this medicine or for at least 6 months after stopping it. Women should inform their doctor if they wish to become pregnant or think they might be pregnant. Women of child-bearing potential will need to have a negative pregnancy test before starting this medicine. There is a potential for serious side effects to an unborn child. Talk to your health care professional or pharmacist for more information. Do not breast-feed an infant while taking this medicine. Men must use a latex condom during sexual contact with a woman while taking this medicine and for at least 4 months after stopping it. A latex condom is needed even if you have had a vasectomy. Contact your doctor right away if your partner becomes pregnant. Do not donate sperm while taking this medicine and for at least 4 months after you stop taking this medicine. Men should inform their doctors if they wish to father a child. This medicine may lower sperm counts. What side effects may I notice from receiving this medication? Side effects that you should report to your doctor or health care professional  as soon as possible: allergic reactions like skin rash, itching or hives, swelling of the face, lips, or tongue low blood counts - this medicine may decrease the number of white blood cells, red blood cells, and platelets. You may be at increased risk for infections and bleeding nausea, vomiting redness, blistering, peeling or loosening of the skin, including inside the mouth signs and symptoms of infection like fever; chills; cough; sore throat; pain or trouble passing urine signs and symptoms of low red blood cells or anemia such as unusually weak or tired; feeling faint or lightheaded; falls; breathing problems unusual bruising  or bleeding Side effects that usually do not require medical attention (report to your doctor or health care professional if they continue or are bothersome): changes in taste diarrhea hair loss loss of appetite mouth sores This list may not describe all possible side effects. Call your doctor for medical advice about side effects. You may report side effects to FDA at 1-800-FDA-1088. Where should I keep my medication? This drug is given in a hospital or clinic and will not be stored at home. NOTE: This sheet is a summary. It may not cover all possible information. If you have questions about this medicine, talk to your doctor, pharmacist, or health care provider.  2022 Elsevier/Gold Standard (2018-10-14 16:57:15)

## 2021-06-07 ENCOUNTER — Ambulatory Visit
Admission: RE | Admit: 2021-06-07 | Discharge: 2021-06-07 | Disposition: A | Discharge status: Home / Self Care | Payer: BC Managed Care – PPO | Source: Ambulatory Visit | Attending: Radiation Oncology | Admitting: Radiation Oncology

## 2021-06-07 ENCOUNTER — Inpatient Hospital Stay: Payer: BC Managed Care – PPO

## 2021-06-07 DIAGNOSIS — C3412 Malignant neoplasm of upper lobe, left bronchus or lung: Secondary | ICD-10-CM | POA: Diagnosis not present

## 2021-06-07 DIAGNOSIS — Z510 Encounter for antineoplastic radiation therapy: Secondary | ICD-10-CM | POA: Diagnosis not present

## 2021-06-07 DIAGNOSIS — C7931 Secondary malignant neoplasm of brain: Secondary | ICD-10-CM | POA: Diagnosis not present

## 2021-06-08 ENCOUNTER — Encounter (INDEPENDENT_AMBULATORY_CARE_PROVIDER_SITE_OTHER): Payer: Self-pay

## 2021-06-08 ENCOUNTER — Ambulatory Visit
Admission: RE | Admit: 2021-06-08 | Discharge: 2021-06-08 | Disposition: A | Discharge status: Home / Self Care | Payer: BC Managed Care – PPO | Source: Ambulatory Visit | Attending: Radiation Oncology | Admitting: Radiation Oncology

## 2021-06-08 ENCOUNTER — Inpatient Hospital Stay: Payer: BC Managed Care – PPO

## 2021-06-08 DIAGNOSIS — G939 Disorder of brain, unspecified: Secondary | ICD-10-CM

## 2021-06-08 DIAGNOSIS — C3412 Malignant neoplasm of upper lobe, left bronchus or lung: Secondary | ICD-10-CM | POA: Diagnosis not present

## 2021-06-08 DIAGNOSIS — Z510 Encounter for antineoplastic radiation therapy: Secondary | ICD-10-CM | POA: Diagnosis not present

## 2021-06-08 DIAGNOSIS — C7931 Secondary malignant neoplasm of brain: Secondary | ICD-10-CM | POA: Diagnosis not present

## 2021-06-08 LAB — CBC
HCT: 36.7 % (ref 36.0–46.0)
Hemoglobin: 12.2 g/dL (ref 12.0–15.0)
MCH: 29.5 pg (ref 26.0–34.0)
MCHC: 33.2 g/dL (ref 30.0–36.0)
MCV: 88.6 fL (ref 80.0–100.0)
Platelets: 358 10*3/uL (ref 150–400)
RBC: 4.14 MIL/uL (ref 3.87–5.11)
RDW: 17.2 % — ABNORMAL HIGH (ref 11.5–15.5)
WBC: 6 10*3/uL (ref 4.0–10.5)
nRBC: 0 % (ref 0.0–0.2)

## 2021-06-11 ENCOUNTER — Inpatient Hospital Stay: Payer: BC Managed Care – PPO

## 2021-06-11 ENCOUNTER — Ambulatory Visit
Admission: RE | Admit: 2021-06-11 | Discharge: 2021-06-11 | Disposition: A | Discharge status: Home / Self Care | Payer: BC Managed Care – PPO | Source: Ambulatory Visit | Attending: Radiation Oncology | Admitting: Radiation Oncology

## 2021-06-11 ENCOUNTER — Inpatient Hospital Stay: Payer: BC Managed Care – PPO | Admitting: Oncology

## 2021-06-11 ENCOUNTER — Ambulatory Visit: Payer: BC Managed Care – PPO

## 2021-06-11 DIAGNOSIS — C3490 Malignant neoplasm of unspecified part of unspecified bronchus or lung: Secondary | ICD-10-CM

## 2021-06-11 DIAGNOSIS — C7931 Secondary malignant neoplasm of brain: Secondary | ICD-10-CM | POA: Diagnosis not present

## 2021-06-11 DIAGNOSIS — C3412 Malignant neoplasm of upper lobe, left bronchus or lung: Secondary | ICD-10-CM | POA: Diagnosis not present

## 2021-06-11 DIAGNOSIS — Z510 Encounter for antineoplastic radiation therapy: Secondary | ICD-10-CM | POA: Diagnosis not present

## 2021-06-11 NOTE — Telephone Encounter (Signed)
Of admission began a month ago asking for a return to work letter and then 4 days ago she said she is not working so not sure what the patient actually needs with seizures stroke and cancer diagnosis she probably should be on FMLA. Please call and see if the patient is actually in need of any work notes or FMLA paperwork? Work notes would only be available for office visits with [place] within a few days of the visit Any FMLA paperwork will need office visit and may be completed by her oncologist or other managing specialist but if she needs it from primary care she is welcome to get a visit to complete paperwork

## 2021-06-12 ENCOUNTER — Ambulatory Visit
Admission: RE | Admit: 2021-06-12 | Discharge: 2021-06-12 | Disposition: A | Discharge status: Home / Self Care | Payer: BC Managed Care – PPO | Source: Ambulatory Visit | Attending: Radiation Oncology | Admitting: Radiation Oncology

## 2021-06-12 ENCOUNTER — Ambulatory Visit: Payer: BC Managed Care – PPO

## 2021-06-12 ENCOUNTER — Telehealth: Payer: Self-pay

## 2021-06-12 ENCOUNTER — Inpatient Hospital Stay: Payer: BC Managed Care – PPO

## 2021-06-12 DIAGNOSIS — C3412 Malignant neoplasm of upper lobe, left bronchus or lung: Secondary | ICD-10-CM | POA: Diagnosis not present

## 2021-06-12 DIAGNOSIS — Z510 Encounter for antineoplastic radiation therapy: Secondary | ICD-10-CM | POA: Diagnosis not present

## 2021-06-12 DIAGNOSIS — C7931 Secondary malignant neoplasm of brain: Secondary | ICD-10-CM | POA: Diagnosis not present

## 2021-06-12 NOTE — Telephone Encounter (Signed)
Spoke to pt, states she does not need any work note due to not being able to work. Also she already has FMLA paperwork done.

## 2021-06-13 ENCOUNTER — Ambulatory Visit
Admission: RE | Admit: 2021-06-13 | Discharge: 2021-06-13 | Disposition: A | Discharge status: Home / Self Care | Payer: BC Managed Care – PPO | Source: Ambulatory Visit | Attending: Radiation Oncology | Admitting: Radiation Oncology

## 2021-06-13 ENCOUNTER — Other Ambulatory Visit: Payer: Self-pay | Admitting: Oncology

## 2021-06-13 ENCOUNTER — Other Ambulatory Visit (INDEPENDENT_AMBULATORY_CARE_PROVIDER_SITE_OTHER): Payer: Self-pay | Admitting: Vascular Surgery

## 2021-06-13 ENCOUNTER — Ambulatory Visit: Payer: BC Managed Care – PPO

## 2021-06-13 ENCOUNTER — Inpatient Hospital Stay: Payer: BC Managed Care – PPO

## 2021-06-13 DIAGNOSIS — F17210 Nicotine dependence, cigarettes, uncomplicated: Secondary | ICD-10-CM | POA: Diagnosis not present

## 2021-06-13 DIAGNOSIS — C7931 Secondary malignant neoplasm of brain: Secondary | ICD-10-CM | POA: Diagnosis not present

## 2021-06-13 DIAGNOSIS — C3412 Malignant neoplasm of upper lobe, left bronchus or lung: Secondary | ICD-10-CM | POA: Diagnosis not present

## 2021-06-13 DIAGNOSIS — Z510 Encounter for antineoplastic radiation therapy: Secondary | ICD-10-CM | POA: Diagnosis not present

## 2021-06-14 ENCOUNTER — Inpatient Hospital Stay (HOSPITAL_BASED_OUTPATIENT_CLINIC_OR_DEPARTMENT_OTHER): Payer: BC Managed Care – PPO | Admitting: Oncology

## 2021-06-14 ENCOUNTER — Inpatient Hospital Stay: Payer: BC Managed Care – PPO

## 2021-06-14 ENCOUNTER — Other Ambulatory Visit: Payer: Self-pay

## 2021-06-14 ENCOUNTER — Encounter: Payer: Self-pay | Admitting: *Deleted

## 2021-06-14 ENCOUNTER — Encounter: Payer: Self-pay | Admitting: Oncology

## 2021-06-14 VITALS — BP 117/75 | HR 89 | Temp 97.2°F | Resp 18

## 2021-06-14 DIAGNOSIS — Z510 Encounter for antineoplastic radiation therapy: Secondary | ICD-10-CM | POA: Diagnosis not present

## 2021-06-14 DIAGNOSIS — R569 Unspecified convulsions: Secondary | ICD-10-CM | POA: Diagnosis not present

## 2021-06-14 DIAGNOSIS — C7931 Secondary malignant neoplasm of brain: Secondary | ICD-10-CM | POA: Diagnosis not present

## 2021-06-14 DIAGNOSIS — C3490 Malignant neoplasm of unspecified part of unspecified bronchus or lung: Secondary | ICD-10-CM

## 2021-06-14 DIAGNOSIS — G939 Disorder of brain, unspecified: Secondary | ICD-10-CM

## 2021-06-14 DIAGNOSIS — C3412 Malignant neoplasm of upper lobe, left bronchus or lung: Secondary | ICD-10-CM | POA: Diagnosis not present

## 2021-06-14 LAB — CBC WITH DIFFERENTIAL/PLATELET
Abs Immature Granulocytes: 0.03 10*3/uL (ref 0.00–0.07)
Basophils Absolute: 0 10*3/uL (ref 0.0–0.1)
Basophils Relative: 0 %
Eosinophils Absolute: 0 10*3/uL (ref 0.0–0.5)
Eosinophils Relative: 0 %
HCT: 32.9 % — ABNORMAL LOW (ref 36.0–46.0)
Hemoglobin: 10.5 g/dL — ABNORMAL LOW (ref 12.0–15.0)
Immature Granulocytes: 1 %
Lymphocytes Relative: 12 %
Lymphs Abs: 0.7 10*3/uL (ref 0.7–4.0)
MCH: 29.2 pg (ref 26.0–34.0)
MCHC: 31.9 g/dL (ref 30.0–36.0)
MCV: 91.4 fL (ref 80.0–100.0)
Monocytes Absolute: 0.1 10*3/uL (ref 0.1–1.0)
Monocytes Relative: 2 %
Neutro Abs: 5.1 10*3/uL (ref 1.7–7.7)
Neutrophils Relative %: 85 %
Platelets: 305 10*3/uL (ref 150–400)
RBC: 3.6 MIL/uL — ABNORMAL LOW (ref 3.87–5.11)
RDW: 17.2 % — ABNORMAL HIGH (ref 11.5–15.5)
WBC: 6 10*3/uL (ref 4.0–10.5)
nRBC: 0 % (ref 0.0–0.2)

## 2021-06-14 LAB — COMPREHENSIVE METABOLIC PANEL
ALT: 22 U/L (ref 0–44)
AST: 19 U/L (ref 15–41)
Albumin: 3.8 g/dL (ref 3.5–5.0)
Alkaline Phosphatase: 53 U/L (ref 38–126)
Anion gap: 8 (ref 5–15)
BUN: 7 mg/dL (ref 6–20)
CO2: 21 mmol/L — ABNORMAL LOW (ref 22–32)
Calcium: 8.2 mg/dL — ABNORMAL LOW (ref 8.9–10.3)
Chloride: 106 mmol/L (ref 98–111)
Creatinine, Ser: 0.34 mg/dL — ABNORMAL LOW (ref 0.44–1.00)
GFR, Estimated: >60 mL/min (ref >60)
Glucose, Bld: 108 mg/dL — ABNORMAL HIGH (ref 70–99)
Potassium: 3.4 mmol/L — ABNORMAL LOW (ref 3.5–5.1)
Sodium: 135 mmol/L (ref 135–145)
Total Bilirubin: <0.1 mg/dL — ABNORMAL LOW (ref 0.3–1.2)
Total Protein: 6.5 g/dL (ref 6.5–8.1)

## 2021-06-14 MED ORDER — DEXAMETHASONE 1 MG PO TABS: 1.0000 mg | ORAL_TABLET | ORAL | 0 refills | Status: DC

## 2021-06-14 MED ORDER — POTASSIUM CHLORIDE CRYS ER 10 MEQ PO TBCR: 10.0000 meq | EXTENDED_RELEASE_TABLET | Freq: Every day | ORAL | 0 refills | Status: DC

## 2021-06-14 NOTE — H&P (View-Only) (Signed)
Hematology/Oncology progress note [facility] Telephone:([phone] [phone]   Patient Care Team: Delsa Grana, PA-C as PCP - General (Family Medicine) Telford Nab, RN as Oncology Nurse Navigator  REFERRING PROVIDER: Delsa Grana, PA-C  CHIEF COMPLAINTS/REASON FOR VISIT:  Extensive small cell lung cancer  HISTORY OF PRESENTING ILLNESS:   Jenna Newton is a  55 y.o.  female with PMH listed below was seen in consultation at the request of  Delsa Grana, PA-C  for evaluation of lung mass  05/08/2021, patient presented to emergency room for evaluation of expressive aphasia.  Acute onset of symptoms.  She/has no other neurological deficits at that time.  In ER, patient was found to have expressive aphasia with intermittent word at rest, though receptive and follows commands. MRI brain with and without contrast showed multiple small peripherally enhancing lesions.  Primary differential consideration is metastatic disease.   Patient declined being admitted.  Was eventually convinced to stay to do CT chest abdomen pelvis with contrast. CT showed a 3.2 cm posterior left upper lobe mass extending from the left hilum, concerning for primary bronchogenic carcinoma.  Enlarged left hilar and prominent bilateral peritracheal lymph nodes are identified.  Worrisome for nodal metastasis.  Similar appearance of bilateral adrenal nodule compared with 12/17/2013.  Favor benign adenomas.  Coronary artery calcifications, enlarged fibroid uterus. Patient prefers to be discharged home.  She was sent on dexamethasone 4 mg twice daily.  And Keppra 500 mg twice daily.  Patient was referred to establish care with cancer center. Today she has no difficulty in speech.  She was accompanied by her cousin. She has some gait change with right foot drop. No additional episodes of expressive aphasia.  Patient drinks alcohol, 12 packs of beer /week, current every day smoker.   05/25/2021  left upper lobe biopsy via bronchoscopy showed small cell lung cancer. left upper lobe brushing/ LUB lavage + small cell lung cancer,  Precarinal lymph node biopsy- not enough tissue  05/30/2021- 06/13/2021 palliative brain RT.  06/04/2021 carboplatin etoposide  INTERVAL HISTORY Jenna Newton is a 55 y.o. female who has above history reviewed by me today presents for follow up visit for management of extensive small cell lung cancer  S/p cycle 1 carboplatin etoposide. She denies nausea vomiting, diarrhea.  She rescheduled her medi port placement schedule to next week.  On Keppra 500mg  BID .  Dexamethasone 4mg  daily.  Appetite is good  Review of Systems  Constitutional:  Negative for appetite change, chills, fatigue and fever.  HENT:   Negative for hearing loss and voice change.   Eyes:  Negative for eye problems.  Respiratory:  Negative for chest tightness and cough.   Cardiovascular:  Negative for chest pain.  Gastrointestinal:  Negative for abdominal distention, abdominal pain and blood in stool.  Endocrine: Negative for hot flashes.  Genitourinary:  Negative for difficulty urinating and frequency.   Musculoskeletal:  Positive for gait problem. Negative for arthralgias.  Skin:  Negative for itching and rash.  Neurological:  Positive for gait problem and speech difficulty. Negative for extremity weakness, headaches and seizures.  Hematological:  Negative for adenopathy.  Psychiatric/Behavioral:  Negative for confusion.    MEDICAL HISTORY:  Past Medical History:  Diagnosis Date   Anemia    GERD (gastroesophageal reflux disease)    Hypertension    Neuropathy    Tobacco abuse 03/14/2017   Vertigo    White matter disease of brain due to ischemia 12/04/2017   Head CT March 2019  SURGICAL HISTORY: Past Surgical History:  Procedure Laterality Date   none     VIDEO BRONCHOSCOPY WITH ENDOBRONCHIAL NAVIGATION N/A 05/25/2021   Procedure: ROBOTIC ASSISTED VIDEO BRONCHOSCOPY WITH  ENDOBRONCHIAL NAVIGATION;  Surgeon: Tyler Pita, MD;  Location: [facility] ORS;  Service: Pulmonary;  Laterality: N/A;   VIDEO BRONCHOSCOPY WITH ENDOBRONCHIAL ULTRASOUND N/A 05/25/2021   Procedure: VIDEO BRONCHOSCOPY WITH ENDOBRONCHIAL ULTRASOUND;  Surgeon: Tyler Pita, MD;  Location: [facility] ORS;  Service: Pulmonary;  Laterality: N/A;    SOCIAL HISTORY: Social History   Socioeconomic History   Marital status: Single    Spouse name: Not on file   Number of children: 0   Years of education: Not on file   Highest education level: Bachelor's degree (e.g., BA, AB, BS)  Occupational History   Not on file  Tobacco Use   Smoking status: Every Day    Packs/day: 1.00    Years: 25.00    Pack years: 25.00    Types: Cigarettes   Smokeless tobacco: Never  Vaping Use   Vaping Use: Never used  Substance and Sexual Activity   Alcohol use: Yes    Alcohol/week: 14.0 standard drinks    Types: 14 Cans of beer per week    Comment: 12 pack/ week   Drug use: No   Sexual activity: Yes    Partners: Male    Birth control/protection: Post-menopausal  Other Topics Concern   Not on file  Social History Narrative   Lives with brother   Social Determinants of Health   Financial Resource Strain: Not on file  Food Insecurity: Not on file  Transportation Needs: Not on file  Physical Activity: Not on file  Stress: Not on file  Social Connections: Not on file  Intimate Partner Violence: Not on file    FAMILY HISTORY: Family History  Problem Relation Age of Onset   Diabetes Mother    Hypertension Mother    Emphysema Mother    Emphysema Father    Diabetes Brother    Alcohol abuse Brother    Diabetes Maternal Grandmother    Hypertension Maternal Grandmother    Blindness Maternal Grandmother    Cancer Maternal Grandfather        prostate   Emphysema Maternal Grandfather    Cancer Paternal Grandmother    Emphysema Paternal Grandfather     ALLERGIES:  is allergic to penicillins,  meclizine, and sulfa antibiotics.  MEDICATIONS:  Current Outpatient Medications  Medication Sig Dispense Refill   amLODipine (NORVASC) 5 MG tablet Take 1 tablet (5 mg total) by mouth daily. 90 tablet 3   cromolyn (OPTICROM) 4 % ophthalmic solution Place 1 drop into both eyes 4 (four) times daily as needed. 10 mL 3   dexamethasone (DECADRON) 1 MG tablet Take 1 tablet (1 mg total) by mouth See admin instructions. Take 3mg  daily for 1 week and then take 2mg  daily for 1 week. 35 tablet 0   dexamethasone (DECADRON) 4 MG tablet Take 1 tablet (4 mg total) by mouth daily. 30 tablet 0   fluticasone (FLONASE) 50 MCG/ACT nasal spray Place 1 spray into both nostrils daily as needed for allergies.     levETIRAcetam (KEPPRA) 500 MG tablet Take 1 tablet (500 mg total) by mouth 2 (two) times daily. 120 tablet 0   montelukast (SINGULAIR) 10 MG tablet Take 1 tablet (10 mg total) by mouth at bedtime. 90 tablet 3   ondansetron (ZOFRAN) 8 MG tablet Take 1 tablet (8 mg total) by mouth 2 (  two) times daily as needed for refractory nausea / vomiting. Start on day 3 after carboplatin chemo. 30 tablet 1   potassium chloride (KLOR-CON) 10 MEQ tablet Take 1 tablet (10 mEq total) by mouth daily. 30 tablet 0   prochlorperazine (COMPAZINE) 10 MG tablet Take 1 tablet (10 mg total) by mouth every 6 (six) hours as needed (Nausea or vomiting). 30 tablet 1   Tiotropium Bromide-Olodaterol (STIOLTO RESPIMAT) 2.5-2.5 MCG/ACT AERS Inhale 2 puffs into the lungs daily. (Patient taking differently: Inhale 2 puffs into the lungs 2 (two) times daily.) 1 each 0   vitamin B-12 (CYANOCOBALAMIN) 500 MCG tablet Take 1 tablet (500 mcg total) by mouth daily. 100 tablet 3   Aspirin-Caffeine (BC FAST PAIN RELIEF PO) Take 1 packet by mouth daily as needed (pain). (Patient not taking: No sig reported)     meclizine (ANTIVERT) 25 MG tablet Take 1 tablet (25 mg total) by mouth 3 (three) times daily as needed for dizziness (vertigo). (Patient not taking: No  sig reported) 90 tablet 11   saline (AYR) GEL Place 1 application into the nose every 4 (four) hours. (Patient not taking: No sig reported) 14.1 g 3   No current facility-administered medications for this visit.     PHYSICAL EXAMINATION: ECOG PERFORMANCE STATUS: 1 - Symptomatic but completely ambulatory Vitals:   06/14/21 1039  BP: 117/75  Pulse: 89  Resp: 18  Temp: (!) 97.2 F (36.2 C)  SpO2: 97%   There were no vitals filed for this visit.   Physical Exam Constitutional:      General: She is not in acute distress. HENT:     Head: Normocephalic and atraumatic.  Eyes:     General: No scleral icterus. Cardiovascular:     Rate and Rhythm: Normal rate and regular rhythm.     Heart sounds: Normal heart sounds.  Pulmonary:     Effort: Pulmonary effort is normal. No respiratory distress.     Breath sounds: No wheezing.  Abdominal:     General: Bowel sounds are normal. There is no distension.     Palpations: Abdomen is soft.  Musculoskeletal:        General: No deformity. Normal range of motion.     Cervical back: Normal range of motion and neck supple.  Skin:    General: Skin is warm and dry.     Findings: No erythema or rash.  Neurological:     Mental Status: She is alert and oriented to person, place, and time. Mental status is at baseline.     Cranial Nerves: No cranial nerve deficit.     Coordination: Coordination normal.     Comments: Gait has improved.   Psychiatric:        Mood and Affect: Mood normal.    LABORATORY DATA:  I have reviewed the data as listed Lab Results  Component Value Date   WBC 6.0 06/14/2021   HGB 10.5 (L) 06/14/2021   HCT 32.9 (L) 06/14/2021   MCV 91.4 06/14/2021   PLT 305 06/14/2021   Recent Labs    05/08/21 1310 06/04/21 0929 06/14/21 0948  NA 135 136 135  K 4.4 4.0 3.4*  CL 99 97* 106  CO2 25 30 21*  GLUCOSE 114* 109* 108*  BUN 13 11 7   CREATININE 0.45 0.37* 0.34*  CALCIUM 9.4 9.1 8.2*  GFRNONAA >60 >60 >60  PROT 8.1  7.1 6.5  ALBUMIN 4.4 4.0 3.8  AST 35 21 19  ALT 30 24 22  ALKPHOS 98 51 53  BILITOT 1.2 0.4 <0.1*    Iron/TIBC/Ferritin/ %Sat    Component Value Date/Time   FERRITIN 126 03/14/2017 0926       RADIOGRAPHIC STUDIES: I have personally reviewed the radiological images as listed and agreed with the findings in the report. DG Chest Port 1 View  Result Date: 05/25/2021 CLINICAL DATA:  Status post biopsy left upper lobe mass EXAM: PORTABLE CHEST 1 VIEW COMPARISON:  04/10/2018, CT 05/24/2021 FINDINGS: No acute airspace disease or pleural effusion is seen. Normal cardiac size with aortic atherosclerosis. No visible pneumothorax. Left upper lobe lung nodule is better seen on CT. Left hilar opacity consistent with enlarged hilar lymph nodes on CT. Emphysema IMPRESSION: 1. Negative for pneumothorax. 2. Emphysema. 3. Left upper lobe lung mass is better seen on CT. Asymmetric opacity in the left hilus corresponding to lymph nodes on CT Electronically Signed   By: Donavan Foil M.D.   On: 05/25/2021 16:34   DG C-Arm 1-60 Min-No Report  Result Date: 05/25/2021 Fluoroscopy was utilized by the requesting physician.  No radiographic interpretation.   CT SUPER D CHEST WO CONTRAST  Result Date: 05/25/2021 CLINICAL DATA:  Metoprolol lesion. EXAM: CT CHEST WITHOUT CONTRAST TECHNIQUE: Multidetector CT imaging of the chest was performed using thin slice collimation for electromagnetic bronchoscopy planning purposes, without intravenous contrast. COMPARISON:  CT chest, abdomen and pelvis dated May 08, 2021 FINDINGS: Cardiovascular: Normal heart size. Three-vessel coronary artery calcifications. Atherosclerotic disease of thoracic aorta. Mediastinum/Nodes: No pathologically enlarged lymph nodes seen in the chest. Normal esophagus. Lungs/Pleura: Central airways are patent. Unchanged tubular nodule of the left upper lobe measuring 3.1 x 1.1 cm on sagittal series. Moderate centrilobular emphysema. No pleural  effusion or pneumothorax. Upper Abdomen: Bilateral adrenal nodules, better evaluated on recent CT. No acute abnormalities. Musculoskeletal: No chest wall mass or suspicious bone lesions identified. IMPRESSION: Unchanged size of left upper lobe tubular nodule. Correlate with scheduled transbronchial biopsy. Aortic Atherosclerosis (ICD10-I70.0) and Emphysema (ICD10-J43.9). Electronically Signed   By: Yetta Glassman M.D.   On: 05/25/2021 15:21      ASSESSMENT & PLAN:  1. Small cell lung cancer ([place])   2. Brain lesion   3. Seizure [facility])   Cancer Staging Small cell lung cancer [facility]) Staging form: Lung, AJCC 8th Edition - Clinical stage from 06/01/2021: Stage IV (cT2a, cN3, cM1) - Signed by Earlie Server, MD on 06/01/2021  # Extensive small cell cancer, brain metastatic disease S/p 1 cycle of carboplatin + etoposide Finished RT. She tolerates well.   # Brain metastasis and seizure S/p RT, steroid taper. Keppra 50mg  BID Continue Dexamethasone 4mg  daily, finish current supply.  Then taper down to 3mg  daily x 1 week, followed by 2mg  daily x 1 week, 1mg  daily x 1 will further taper down after that.   # Anemia, chemotherapy induced.   Supportive care measures are necessary for patient well-being and will be provided as necessary. We spent sufficient time to discuss many aspect of care, questions were answered to patient's satisfaction.  Follow up 2 weeks lab MD carboplatin/Etoposide/tecentriq    All questions were answered. The patient knows to call the clinic with any problems questions or concerns.  cc Delsa Grana, PA-C     Earlie Server, MD, PhD Hematology Oncology [facility]  06/14/2021

## 2021-06-14 NOTE — Progress Notes (Signed)
Pt has questions regarding her continued ability to drive and would like her port placement appt time changed.

## 2021-06-14 NOTE — Progress Notes (Signed)
Hematology/Oncology progress note [facility] Telephone:([phone] [phone]   Patient Care Team: Delsa Grana, PA-C as PCP - General (Family Medicine) Telford Nab, RN as Oncology Nurse Navigator  REFERRING PROVIDER: Delsa Grana, PA-C  CHIEF COMPLAINTS/REASON FOR VISIT:  Extensive small cell lung cancer  HISTORY OF PRESENTING ILLNESS:   Jenna Newton is a  55 y.o.  female with PMH listed below was seen in consultation at the request of  Delsa Grana, PA-C  for evaluation of lung mass  05/08/2021, patient presented to emergency room for evaluation of expressive aphasia.  Acute onset of symptoms.  She/has no other neurological deficits at that time.  In ER, patient was found to have expressive aphasia with intermittent word at rest, though receptive and follows commands. MRI brain with and without contrast showed multiple small peripherally enhancing lesions.  Primary differential consideration is metastatic disease.   Patient declined being admitted.  Was eventually convinced to stay to do CT chest abdomen pelvis with contrast. CT showed a 3.2 cm posterior left upper lobe mass extending from the left hilum, concerning for primary bronchogenic carcinoma.  Enlarged left hilar and prominent bilateral peritracheal lymph nodes are identified.  Worrisome for nodal metastasis.  Similar appearance of bilateral adrenal nodule compared with 12/17/2013.  Favor benign adenomas.  Coronary artery calcifications, enlarged fibroid uterus. Patient prefers to be discharged home.  She was sent on dexamethasone 4 mg twice daily.  And Keppra 500 mg twice daily.  Patient was referred to establish care with cancer center. Today she has no difficulty in speech.  She was accompanied by her cousin. She has some gait change with right foot drop. No additional episodes of expressive aphasia.  Patient drinks alcohol, 12 packs of beer /week, current every day smoker.   05/25/2021  left upper lobe biopsy via bronchoscopy showed small cell lung cancer. left upper lobe brushing/ LUB lavage + small cell lung cancer,  Precarinal lymph node biopsy- not enough tissue  05/30/2021- 06/13/2021 palliative brain RT.  06/04/2021 carboplatin etoposide  INTERVAL HISTORY Jenna Newton is a 55 y.o. female who has above history reviewed by me today presents for follow up visit for management of extensive small cell lung cancer  S/p cycle 1 carboplatin etoposide. She denies nausea vomiting, diarrhea.  She rescheduled her medi port placement schedule to next week.  On Keppra 500mg  BID .  Dexamethasone 4mg  daily.  Appetite is good  Review of Systems  Constitutional:  Negative for appetite change, chills, fatigue and fever.  HENT:   Negative for hearing loss and voice change.   Eyes:  Negative for eye problems.  Respiratory:  Negative for chest tightness and cough.   Cardiovascular:  Negative for chest pain.  Gastrointestinal:  Negative for abdominal distention, abdominal pain and blood in stool.  Endocrine: Negative for hot flashes.  Genitourinary:  Negative for difficulty urinating and frequency.   Musculoskeletal:  Positive for gait problem. Negative for arthralgias.  Skin:  Negative for itching and rash.  Neurological:  Positive for gait problem and speech difficulty. Negative for extremity weakness, headaches and seizures.  Hematological:  Negative for adenopathy.  Psychiatric/Behavioral:  Negative for confusion.    MEDICAL HISTORY:  Past Medical History:  Diagnosis Date   Anemia    GERD (gastroesophageal reflux disease)    Hypertension    Neuropathy    Tobacco abuse 03/14/2017   Vertigo    White matter disease of brain due to ischemia 12/04/2017   Head CT March 2019  SURGICAL HISTORY: Past Surgical History:  Procedure Laterality Date   none     VIDEO BRONCHOSCOPY WITH ENDOBRONCHIAL NAVIGATION N/A 05/25/2021   Procedure: ROBOTIC ASSISTED VIDEO BRONCHOSCOPY WITH  ENDOBRONCHIAL NAVIGATION;  Surgeon: Tyler Pita, MD;  Location: [facility] ORS;  Service: Pulmonary;  Laterality: N/A;   VIDEO BRONCHOSCOPY WITH ENDOBRONCHIAL ULTRASOUND N/A 05/25/2021   Procedure: VIDEO BRONCHOSCOPY WITH ENDOBRONCHIAL ULTRASOUND;  Surgeon: Tyler Pita, MD;  Location: [facility] ORS;  Service: Pulmonary;  Laterality: N/A;    SOCIAL HISTORY: Social History   Socioeconomic History   Marital status: Single    Spouse name: Not on file   Number of children: 0   Years of education: Not on file   Highest education level: Bachelor's degree (e.g., BA, AB, BS)  Occupational History   Not on file  Tobacco Use   Smoking status: Every Day    Packs/day: 1.00    Years: 25.00    Pack years: 25.00    Types: Cigarettes   Smokeless tobacco: Never  Vaping Use   Vaping Use: Never used  Substance and Sexual Activity   Alcohol use: Yes    Alcohol/week: 14.0 standard drinks    Types: 14 Cans of beer per week    Comment: 12 pack/ week   Drug use: No   Sexual activity: Yes    Partners: Male    Birth control/protection: Post-menopausal  Other Topics Concern   Not on file  Social History Narrative   Lives with brother   Social Determinants of Health   Financial Resource Strain: Not on file  Food Insecurity: Not on file  Transportation Needs: Not on file  Physical Activity: Not on file  Stress: Not on file  Social Connections: Not on file  Intimate Partner Violence: Not on file    FAMILY HISTORY: Family History  Problem Relation Age of Onset   Diabetes Mother    Hypertension Mother    Emphysema Mother    Emphysema Father    Diabetes Brother    Alcohol abuse Brother    Diabetes Maternal Grandmother    Hypertension Maternal Grandmother    Blindness Maternal Grandmother    Cancer Maternal Grandfather        prostate   Emphysema Maternal Grandfather    Cancer Paternal Grandmother    Emphysema Paternal Grandfather     ALLERGIES:  is allergic to penicillins,  meclizine, and sulfa antibiotics.  MEDICATIONS:  Current Outpatient Medications  Medication Sig Dispense Refill   amLODipine (NORVASC) 5 MG tablet Take 1 tablet (5 mg total) by mouth daily. 90 tablet 3   cromolyn (OPTICROM) 4 % ophthalmic solution Place 1 drop into both eyes 4 (four) times daily as needed. 10 mL 3   dexamethasone (DECADRON) 1 MG tablet Take 1 tablet (1 mg total) by mouth See admin instructions. Take 3mg  daily for 1 week and then take 2mg  daily for 1 week. 35 tablet 0   dexamethasone (DECADRON) 4 MG tablet Take 1 tablet (4 mg total) by mouth daily. 30 tablet 0   fluticasone (FLONASE) 50 MCG/ACT nasal spray Place 1 spray into both nostrils daily as needed for allergies.     levETIRAcetam (KEPPRA) 500 MG tablet Take 1 tablet (500 mg total) by mouth 2 (two) times daily. 120 tablet 0   montelukast (SINGULAIR) 10 MG tablet Take 1 tablet (10 mg total) by mouth at bedtime. 90 tablet 3   ondansetron (ZOFRAN) 8 MG tablet Take 1 tablet (8 mg total) by mouth 2 (  two) times daily as needed for refractory nausea / vomiting. Start on day 3 after carboplatin chemo. 30 tablet 1   potassium chloride (KLOR-CON) 10 MEQ tablet Take 1 tablet (10 mEq total) by mouth daily. 30 tablet 0   prochlorperazine (COMPAZINE) 10 MG tablet Take 1 tablet (10 mg total) by mouth every 6 (six) hours as needed (Nausea or vomiting). 30 tablet 1   Tiotropium Bromide-Olodaterol (STIOLTO RESPIMAT) 2.5-2.5 MCG/ACT AERS Inhale 2 puffs into the lungs daily. (Patient taking differently: Inhale 2 puffs into the lungs 2 (two) times daily.) 1 each 0   vitamin B-12 (CYANOCOBALAMIN) 500 MCG tablet Take 1 tablet (500 mcg total) by mouth daily. 100 tablet 3   Aspirin-Caffeine (BC FAST PAIN RELIEF PO) Take 1 packet by mouth daily as needed (pain). (Patient not taking: No sig reported)     meclizine (ANTIVERT) 25 MG tablet Take 1 tablet (25 mg total) by mouth 3 (three) times daily as needed for dizziness (vertigo). (Patient not taking: No  sig reported) 90 tablet 11   saline (AYR) GEL Place 1 application into the nose every 4 (four) hours. (Patient not taking: No sig reported) 14.1 g 3   No current facility-administered medications for this visit.     PHYSICAL EXAMINATION: ECOG PERFORMANCE STATUS: 1 - Symptomatic but completely ambulatory Vitals:   06/14/21 1039  BP: 117/75  Pulse: 89  Resp: 18  Temp: (!) 97.2 F (36.2 C)  SpO2: 97%   There were no vitals filed for this visit.   Physical Exam Constitutional:      General: She is not in acute distress. HENT:     Head: Normocephalic and atraumatic.  Eyes:     General: No scleral icterus. Cardiovascular:     Rate and Rhythm: Normal rate and regular rhythm.     Heart sounds: Normal heart sounds.  Pulmonary:     Effort: Pulmonary effort is normal. No respiratory distress.     Breath sounds: No wheezing.  Abdominal:     General: Bowel sounds are normal. There is no distension.     Palpations: Abdomen is soft.  Musculoskeletal:        General: No deformity. Normal range of motion.     Cervical back: Normal range of motion and neck supple.  Skin:    General: Skin is warm and dry.     Findings: No erythema or rash.  Neurological:     Mental Status: She is alert and oriented to person, place, and time. Mental status is at baseline.     Cranial Nerves: No cranial nerve deficit.     Coordination: Coordination normal.     Comments: Gait has improved.   Psychiatric:        Mood and Affect: Mood normal.    LABORATORY DATA:  I have reviewed the data as listed Lab Results  Component Value Date   WBC 6.0 06/14/2021   HGB 10.5 (L) 06/14/2021   HCT 32.9 (L) 06/14/2021   MCV 91.4 06/14/2021   PLT 305 06/14/2021   Recent Labs    05/08/21 1310 06/04/21 0929 06/14/21 0948  NA 135 136 135  K 4.4 4.0 3.4*  CL 99 97* 106  CO2 25 30 21*  GLUCOSE 114* 109* 108*  BUN 13 11 7   CREATININE 0.45 0.37* 0.34*  CALCIUM 9.4 9.1 8.2*  GFRNONAA >60 >60 >60  PROT 8.1  7.1 6.5  ALBUMIN 4.4 4.0 3.8  AST 35 21 19  ALT 30 24 22  ALKPHOS 98 51 53  BILITOT 1.2 0.4 <0.1*    Iron/TIBC/Ferritin/ %Sat    Component Value Date/Time   FERRITIN 126 03/14/2017 0926       RADIOGRAPHIC STUDIES: I have personally reviewed the radiological images as listed and agreed with the findings in the report. DG Chest Port 1 View  Result Date: 05/25/2021 CLINICAL DATA:  Status post biopsy left upper lobe mass EXAM: PORTABLE CHEST 1 VIEW COMPARISON:  04/10/2018, CT 05/24/2021 FINDINGS: No acute airspace disease or pleural effusion is seen. Normal cardiac size with aortic atherosclerosis. No visible pneumothorax. Left upper lobe lung nodule is better seen on CT. Left hilar opacity consistent with enlarged hilar lymph nodes on CT. Emphysema IMPRESSION: 1. Negative for pneumothorax. 2. Emphysema. 3. Left upper lobe lung mass is better seen on CT. Asymmetric opacity in the left hilus corresponding to lymph nodes on CT Electronically Signed   By: Donavan Foil M.D.   On: 05/25/2021 16:34   DG C-Arm 1-60 Min-No Report  Result Date: 05/25/2021 Fluoroscopy was utilized by the requesting physician.  No radiographic interpretation.   CT SUPER D CHEST WO CONTRAST  Result Date: 05/25/2021 CLINICAL DATA:  Metoprolol lesion. EXAM: CT CHEST WITHOUT CONTRAST TECHNIQUE: Multidetector CT imaging of the chest was performed using thin slice collimation for electromagnetic bronchoscopy planning purposes, without intravenous contrast. COMPARISON:  CT chest, abdomen and pelvis dated May 08, 2021 FINDINGS: Cardiovascular: Normal heart size. Three-vessel coronary artery calcifications. Atherosclerotic disease of thoracic aorta. Mediastinum/Nodes: No pathologically enlarged lymph nodes seen in the chest. Normal esophagus. Lungs/Pleura: Central airways are patent. Unchanged tubular nodule of the left upper lobe measuring 3.1 x 1.1 cm on sagittal series. Moderate centrilobular emphysema. No pleural  effusion or pneumothorax. Upper Abdomen: Bilateral adrenal nodules, better evaluated on recent CT. No acute abnormalities. Musculoskeletal: No chest wall mass or suspicious bone lesions identified. IMPRESSION: Unchanged size of left upper lobe tubular nodule. Correlate with scheduled transbronchial biopsy. Aortic Atherosclerosis (ICD10-I70.0) and Emphysema (ICD10-J43.9). Electronically Signed   By: Yetta Glassman M.D.   On: 05/25/2021 15:21      ASSESSMENT & PLAN:  1. Small cell lung cancer ([place])   2. Brain lesion   3. Seizure [facility])   Cancer Staging Small cell lung cancer [facility]) Staging form: Lung, AJCC 8th Edition - Clinical stage from 06/01/2021: Stage IV (cT2a, cN3, cM1) - Signed by Earlie Server, MD on 06/01/2021  # Extensive small cell cancer, brain metastatic disease S/p 1 cycle of carboplatin + etoposide Finished RT. She tolerates well.   # Brain metastasis and seizure S/p RT, steroid taper. Keppra 50mg  BID Continue Dexamethasone 4mg  daily, finish current supply.  Then taper down to 3mg  daily x 1 week, followed by 2mg  daily x 1 week, 1mg  daily x 1 will further taper down after that.   # Anemia, chemotherapy induced.   Supportive care measures are necessary for patient well-being and will be provided as necessary. We spent sufficient time to discuss many aspect of care, questions were answered to patient's satisfaction.  Follow up 2 weeks lab MD carboplatin/Etoposide/tecentriq    All questions were answered. The patient knows to call the clinic with any problems questions or concerns.  cc Delsa Grana, PA-C     Earlie Server, MD, PhD Hematology Oncology [facility] at [facility]  06/14/2021

## 2021-06-15 ENCOUNTER — Encounter: Payer: Self-pay | Admitting: Oncology

## 2021-06-15 NOTE — Progress Notes (Signed)
Met with patient during follow up visit with Dr. Tasia Catchings after receiving first cycle of chemo last week. Pt requested application for handicap placard during visit. Form signed and given to patient. Instructed pt that will need to take to [facility] to obtain placard. Pt requests information about how to apply for food stamps. Referral placed for social work to contact pt to address food stamps. Pt states is doing well after chemo and does not offer any complaints at this time. Pt instructed to call with any further questions or needs. Pt verbalized understanding.

## 2021-06-18 ENCOUNTER — Encounter: Payer: Self-pay | Admitting: Vascular Surgery

## 2021-06-18 ENCOUNTER — Encounter
Admission: RE | Disposition: A | Discharge status: Home / Self Care | Payer: Self-pay | Source: Home / Self Care | Attending: Vascular Surgery

## 2021-06-18 ENCOUNTER — Ambulatory Visit
Admission: RE | Admit: 2021-06-18 | Discharge: 2021-06-18 | Disposition: A | Discharge status: Home / Self Care | Payer: BC Managed Care – PPO | Attending: Vascular Surgery | Admitting: Vascular Surgery

## 2021-06-18 DIAGNOSIS — D6481 Anemia due to antineoplastic chemotherapy: Secondary | ICD-10-CM | POA: Insufficient documentation

## 2021-06-18 DIAGNOSIS — C3490 Malignant neoplasm of unspecified part of unspecified bronchus or lung: Secondary | ICD-10-CM | POA: Insufficient documentation

## 2021-06-18 DIAGNOSIS — R569 Unspecified convulsions: Secondary | ICD-10-CM | POA: Insufficient documentation

## 2021-06-18 DIAGNOSIS — Z79899 Other long term (current) drug therapy: Secondary | ICD-10-CM | POA: Insufficient documentation

## 2021-06-18 DIAGNOSIS — C7931 Secondary malignant neoplasm of brain: Secondary | ICD-10-CM | POA: Insufficient documentation

## 2021-06-18 DIAGNOSIS — F17210 Nicotine dependence, cigarettes, uncomplicated: Secondary | ICD-10-CM | POA: Insufficient documentation

## 2021-06-18 DIAGNOSIS — Z9221 Personal history of antineoplastic chemotherapy: Secondary | ICD-10-CM | POA: Insufficient documentation

## 2021-06-18 DIAGNOSIS — Z882 Allergy status to sulfonamides status: Secondary | ICD-10-CM | POA: Diagnosis not present

## 2021-06-18 DIAGNOSIS — Z880 Allergy status to penicillin: Secondary | ICD-10-CM | POA: Insufficient documentation

## 2021-06-18 HISTORY — PX: PORTA CATH INSERTION: CATH118285

## 2021-06-18 SURGERY — PORTA CATH INSERTION
Anesthesia: Moderate Sedation

## 2021-06-18 MED ORDER — MIDAZOLAM HCL 2 MG/2ML IJ SOLN
INTRAMUSCULAR | Status: DC | PRN
  Administered 2021-06-18: 2 mg via INTRAVENOUS

## 2021-06-18 MED ORDER — MIDAZOLAM HCL 2 MG/ML PO SYRP: 8.0000 mg | ORAL_SOLUTION | Freq: Once | ORAL | Status: DC | PRN

## 2021-06-18 MED ORDER — FENTANYL CITRATE PF 50 MCG/ML IJ SOSY
PREFILLED_SYRINGE | INTRAMUSCULAR | Status: AC
  Filled 2021-06-18: qty 1

## 2021-06-18 MED ORDER — ONDANSETRON HCL 4 MG/2ML IJ SOLN: 4.0000 mg | Freq: Four times a day (QID) | INTRAMUSCULAR | Status: DC | PRN

## 2021-06-18 MED ORDER — CLINDAMYCIN PHOSPHATE 300 MG/50ML IV SOLN: 300.0000 mg | Freq: Once | INTRAVENOUS | Status: DC

## 2021-06-18 MED ORDER — SODIUM CHLORIDE 0.9 % IV SOLN: INTRAVENOUS | Status: DC

## 2021-06-18 MED ORDER — HYDROMORPHONE HCL 1 MG/ML IJ SOLN: 1.0000 mg | Freq: Once | INTRAMUSCULAR | Status: DC | PRN

## 2021-06-18 MED ORDER — FENTANYL CITRATE (PF) 100 MCG/2ML IJ SOLN
INTRAMUSCULAR | Status: DC | PRN
  Administered 2021-06-18: 50 ug via INTRAVENOUS

## 2021-06-18 MED ORDER — METHYLPREDNISOLONE SODIUM SUCC 125 MG IJ SOLR: 125.0000 mg | Freq: Once | INTRAMUSCULAR | Status: DC | PRN

## 2021-06-18 MED ORDER — SODIUM CHLORIDE 0.9 % IV SOLN
Freq: Once | INTRAVENOUS | Status: DC
  Filled 2021-06-18: qty 2

## 2021-06-18 MED ORDER — MIDAZOLAM HCL 2 MG/2ML IJ SOLN
INTRAMUSCULAR | Status: AC
  Filled 2021-06-18: qty 2

## 2021-06-18 MED ORDER — CLINDAMYCIN PHOSPHATE 300 MG/50ML IV SOLN
INTRAVENOUS | Status: AC
  Administered 2021-06-18: 300 mg
  Filled 2021-06-18: qty 50

## 2021-06-18 MED ORDER — FAMOTIDINE 20 MG PO TABS: 40.0000 mg | ORAL_TABLET | Freq: Once | ORAL | Status: DC | PRN

## 2021-06-18 MED ORDER — DIPHENHYDRAMINE HCL 50 MG/ML IJ SOLN: 50.0000 mg | Freq: Once | INTRAMUSCULAR | Status: DC | PRN

## 2021-06-18 SURGICAL SUPPLY — 11 items
COVER PROBE U/S 5X48 (MISCELLANEOUS) ×2 IMPLANT
COVER SURGICAL LIGHT HANDLE (MISCELLANEOUS) ×2 IMPLANT
DERMABOND ADVANCED (GAUZE/BANDAGES/DRESSINGS) ×1
DERMABOND ADVANCED .7 DNX12 (GAUZE/BANDAGES/DRESSINGS) ×1 IMPLANT
HANDLE YANKAUER SUCT BULB TIP (MISCELLANEOUS) ×2 IMPLANT
KIT PORT POWER 8FR ISP CVUE (Port) ×2 IMPLANT
PACK ANGIOGRAPHY (CUSTOM PROCEDURE TRAY) ×2 IMPLANT
PENCIL ELECTRO HAND CTR (MISCELLANEOUS) ×2 IMPLANT
SUT MNCRL AB 4-0 PS2 18 (SUTURE) ×2 IMPLANT
SUT VIC AB 3-0 SH 27 (SUTURE) ×1
SUT VIC AB 3-0 SH 27X BRD (SUTURE) ×1 IMPLANT

## 2021-06-18 NOTE — Op Note (Signed)
      [name] [facility]       Operative Note  Date: 06/18/2021  Preoperative diagnosis:  1. Lung cancer  Postoperative diagnosis:  Same as above  Procedures: #1. Ultrasound guidance for vascular access to the right internal jugular vein. #2. Fluoroscopic guidance for placement of catheter. #3. Placement of CT compatible Port-A-Cath, right internal jugular vein.  Surgeon: Leotis Pain, MD.   Anesthesia: Local with moderate conscious sedation for approximately 15  minutes using 2 mg of Versed and 50 mcg of Fentanyl  Fluoroscopy time: less than 1 minute  Contrast used: 0  Estimated blood loss: 5 cc  Indication for the procedure:  The patient is a 55 y.o.female with lung cancer.  The patient needs a Port-A-Cath for durable venous access, chemotherapy, lab draws, and CT scans. We are asked to place this. Risks and benefits were discussed and informed consent was obtained.  Description of procedure: The patient was brought to the vascular and interventional radiology suite.  Moderate conscious sedation was administered throughout the procedure during a face to face encounter with the patient with my supervision of the RN administering medicines and monitoring the patient's vital signs, pulse oximetry, telemetry and mental status throughout from the start of the procedure until the patient was taken to the recovery room. The right neck chest and shoulder were sterilely prepped and draped, and a sterile surgical field was created. Ultrasound was used to help visualize a patent right internal jugular vein. This was then accessed under direct ultrasound guidance without difficulty with the Seldinger needle and a permanent image was recorded. A J-wire was placed. After skin nick and dilatation, the peel-away sheath was then placed over the wire. I then anesthetized an area under the clavicle approximately 1-2 fingerbreadths. A transverse incision was created and an inferior pocket was  created with electrocautery and blunt dissection. The port was then brought onto the field and placed into the pocket. The catheter was connected to the port and tunneled from the subclavicular incision to the access site. Fluoroscopic guidance was then used to cut the catheter to an appropriate length. The catheter was then placed through the peel-away sheath and the peel-away sheath was removed. The catheter tip was parked in excellent location under fluorocoscopic guidance in the SVC just above the right atrium. The pocket was then irrigated with antibiotic impregnated saline and the wound was closed with a running 3-0 Vicryl and a 4-0 Monocryl. The access incision was closed with a single 4-0 Monocryl. The Huber needle was used to withdraw blood and flush the port with heparinized saline. Dermabond was then placed as a dressing. The patient tolerated the procedure well and was taken to the recovery room in stable condition.   Leotis Pain 06/18/2021 8:58 AM   This note was created with Dragon Medical transcription system. Any errors in dictation are purely unintentional.

## 2021-06-21 ENCOUNTER — Encounter: Payer: Self-pay | Admitting: *Deleted

## 2021-06-21 NOTE — Progress Notes (Signed)
[place] Work  Initial Assessment   Jenna Newton is a 55 y.o. year old female contacted by phone. Clinical Social Work was referred by [employer] for assessment of psychosocial needs; specifically regarding food stamps.   SDOH (Social Determinants of Health) assessments performed: Yes   Distress Screen completed: Yes ONCBCN DISTRESS SCREENING 05/14/2021  Screening Type Initial Screening  Distress experienced in past week (1-10) 0      Family/Social Information:  Housing Arrangement: patient lives with brother and brother's significant other  Family members/support persons in your life? Family Transportation concerns: yes, currently using [employer]  Employment: Out on work excuse. Income source: Short-Term Disability Financial concerns: Yes, current concerns Type of concern: Food and may have future needs once Short-Term disability ends Food access concerns: yes Religious or spiritual practice: yes Medication Concerns: no, but may have concerns in the future  Services Currently in place:  None  Coping/ Adjustment to diagnosis: Patient understands treatment plan and what happens next? yes Concerns about diagnosis and/or treatment: I'm not especially worried about anything and , patient states "I'm too blessed to be stressed" when discussing emotional or mental health needs Patient reported stressors: Food Hopes and priorities: None identified Patient enjoys  None identified Current coping skills/ strengths: Capable of independent living , Communication skills , and Religious Affiliation     SUMMARY: Current SDOH Barriers:  Financial constraints related to being out of work and Limited access to food No emotional concerns or questions at this time CSW encouraged patient to follow up as needed   Interventions: Discussed common feeling and emotions when being diagnosed with cancer, and the importance of support during treatment Informed  patient of the support team roles and support services at [facility] Provided CSW contact information and encouraged patient to call with any questions or concerns Referred patient to [employer]/Food pantry program at [employer] and Provided education regarding Medicaid, Disability, and Physicist, medical. Patient plans to wait until December to apply when her Short-Term Disability income ends   Follow Up Plan: Patient will contact CSW with any support or resource needs Patient verbalizes understanding of plan: Yes    Kennith [facility] , LCSW

## 2021-06-21 NOTE — Interval H&P Note (Signed)
History and Physical Interval Note:  06/21/2021 1:00 PM  Jenna Newton  has presented today for surgery, with the diagnosis of Porta Cath Insertion   Lung Ca.  The various methods of treatment have been discussed with the patient and family. After consideration of risks, benefits and other options for treatment, the patient has consented to  Procedure(s): PORTA CATH INSERTION (N/A) as a surgical intervention.  The patient's history has been reviewed, patient examined, no change in status, stable for surgery.  I have reviewed the patient's chart and labs.  Questions were answered to the patient's satisfaction.     Leotis Pain

## 2021-06-25 ENCOUNTER — Telehealth: Payer: Self-pay

## 2021-06-25 ENCOUNTER — Inpatient Hospital Stay: Payer: BC Managed Care – PPO

## 2021-06-25 ENCOUNTER — Encounter: Payer: Self-pay | Admitting: Oncology

## 2021-06-25 ENCOUNTER — Other Ambulatory Visit: Payer: Self-pay

## 2021-06-25 ENCOUNTER — Inpatient Hospital Stay (HOSPITAL_BASED_OUTPATIENT_CLINIC_OR_DEPARTMENT_OTHER): Payer: BC Managed Care – PPO | Admitting: Oncology

## 2021-06-25 VITALS — BP 110/68 | HR 89 | Temp 98.4°F | Resp 18 | Wt 142.2 lb

## 2021-06-25 DIAGNOSIS — C7931 Secondary malignant neoplasm of brain: Secondary | ICD-10-CM

## 2021-06-25 DIAGNOSIS — T451X5A Adverse effect of antineoplastic and immunosuppressive drugs, initial encounter: Secondary | ICD-10-CM

## 2021-06-25 DIAGNOSIS — C3412 Malignant neoplasm of upper lobe, left bronchus or lung: Secondary | ICD-10-CM | POA: Diagnosis not present

## 2021-06-25 DIAGNOSIS — C3490 Malignant neoplasm of unspecified part of unspecified bronchus or lung: Secondary | ICD-10-CM

## 2021-06-25 DIAGNOSIS — Z7189 Other specified counseling: Secondary | ICD-10-CM

## 2021-06-25 DIAGNOSIS — D6481 Anemia due to antineoplastic chemotherapy: Secondary | ICD-10-CM

## 2021-06-25 DIAGNOSIS — Z5111 Encounter for antineoplastic chemotherapy: Secondary | ICD-10-CM

## 2021-06-25 DIAGNOSIS — R569 Unspecified convulsions: Secondary | ICD-10-CM

## 2021-06-25 DIAGNOSIS — Z510 Encounter for antineoplastic radiation therapy: Secondary | ICD-10-CM | POA: Diagnosis not present

## 2021-06-25 DIAGNOSIS — Z5112 Encounter for antineoplastic immunotherapy: Secondary | ICD-10-CM

## 2021-06-25 DIAGNOSIS — F17210 Nicotine dependence, cigarettes, uncomplicated: Secondary | ICD-10-CM

## 2021-06-25 LAB — COMPREHENSIVE METABOLIC PANEL
ALT: 30 U/L (ref 0–44)
AST: 20 U/L (ref 15–41)
Albumin: 4 g/dL (ref 3.5–5.0)
Alkaline Phosphatase: 52 U/L (ref 38–126)
Anion gap: 5 (ref 5–15)
BUN: 14 mg/dL (ref 6–20)
CO2: 29 mmol/L (ref 22–32)
Calcium: 9.4 mg/dL (ref 8.9–10.3)
Chloride: 101 mmol/L (ref 98–111)
Creatinine, Ser: <0.3 mg/dL — ABNORMAL LOW (ref 0.44–1.00)
Glucose, Bld: 108 mg/dL — ABNORMAL HIGH (ref 70–99)
Potassium: 4 mmol/L (ref 3.5–5.1)
Sodium: 135 mmol/L (ref 135–145)
Total Bilirubin: 0.3 mg/dL (ref 0.3–1.2)
Total Protein: 7.3 g/dL (ref 6.5–8.1)

## 2021-06-25 LAB — CBC WITH DIFFERENTIAL/PLATELET
Abs Immature Granulocytes: 0.21 10*3/uL — ABNORMAL HIGH (ref 0.00–0.07)
Basophils Absolute: 0.1 10*3/uL (ref 0.0–0.1)
Basophils Relative: 1 %
Eosinophils Absolute: 0 10*3/uL (ref 0.0–0.5)
Eosinophils Relative: 0 %
HCT: 35.1 % — ABNORMAL LOW (ref 36.0–46.0)
Hemoglobin: 11.3 g/dL — ABNORMAL LOW (ref 12.0–15.0)
Immature Granulocytes: 2 %
Lymphocytes Relative: 18 %
Lymphs Abs: 2 10*3/uL (ref 0.7–4.0)
MCH: 28.5 pg (ref 26.0–34.0)
MCHC: 32.2 g/dL (ref 30.0–36.0)
MCV: 88.4 fL (ref 80.0–100.0)
Monocytes Absolute: 0.7 10*3/uL (ref 0.1–1.0)
Monocytes Relative: 6 %
Neutro Abs: 8.1 10*3/uL — ABNORMAL HIGH (ref 1.7–7.7)
Neutrophils Relative %: 73 %
Platelets: 379 10*3/uL (ref 150–400)
RBC: 3.97 MIL/uL (ref 3.87–5.11)
RDW: 17.9 % — ABNORMAL HIGH (ref 11.5–15.5)
WBC: 11.1 10*3/uL — ABNORMAL HIGH (ref 4.0–10.5)
nRBC: 0.3 % — ABNORMAL HIGH (ref 0.0–0.2)

## 2021-06-25 MED ORDER — LEVETIRACETAM 500 MG PO TABS: 500.0000 mg | ORAL_TABLET | Freq: Two times a day (BID) | ORAL | 0 refills | Status: DC

## 2021-06-25 MED ORDER — SODIUM CHLORIDE 0.9 % IV SOLN
1200.0000 mg | Freq: Once | INTRAVENOUS | Status: AC
  Administered 2021-06-25: 1200 mg via INTRAVENOUS
  Filled 2021-06-25: qty 20

## 2021-06-25 MED ORDER — DEXAMETHASONE 1 MG PO TABS: 1.0000 mg | ORAL_TABLET | Freq: Every day | ORAL | 0 refills | Status: DC

## 2021-06-25 MED ORDER — HEPARIN SOD (PORK) LOCK FLUSH 100 UNIT/ML IV SOLN
500.0000 [IU] | Freq: Once | INTRAVENOUS | Status: AC
  Administered 2021-06-25: 500 [IU] via INTRAVENOUS
  Filled 2021-06-25: qty 5

## 2021-06-25 MED ORDER — HEPARIN SOD (PORK) LOCK FLUSH 100 UNIT/ML IV SOLN
INTRAVENOUS | Status: AC
  Filled 2021-06-25: qty 5

## 2021-06-25 MED ORDER — SODIUM CHLORIDE 0.9 % IV SOLN
10.0000 mg | Freq: Once | INTRAVENOUS | Status: AC
  Administered 2021-06-25: 10 mg via INTRAVENOUS
  Filled 2021-06-25: qty 10

## 2021-06-25 MED ORDER — SODIUM CHLORIDE 0.9% FLUSH
10.0000 mL | INTRAVENOUS | Status: DC | PRN
  Administered 2021-06-25: 10 mL via INTRAVENOUS
  Filled 2021-06-25: qty 10

## 2021-06-25 MED ORDER — SODIUM CHLORIDE 0.9 % IV SOLN
490.0000 mg | Freq: Once | INTRAVENOUS | Status: AC
  Administered 2021-06-25: 490 mg via INTRAVENOUS
  Filled 2021-06-25: qty 49

## 2021-06-25 MED ORDER — PALONOSETRON HCL INJECTION 0.25 MG/5ML
0.2500 mg | Freq: Once | INTRAVENOUS | Status: AC
  Administered 2021-06-25: 0.25 mg via INTRAVENOUS
  Filled 2021-06-25: qty 5

## 2021-06-25 MED ORDER — LIDOCAINE-PRILOCAINE 2.5-2.5 % EX CREA: 1.0000 "application " | TOPICAL_CREAM | CUTANEOUS | 3 refills | Status: DC | PRN

## 2021-06-25 MED ORDER — SODIUM CHLORIDE 0.9 % IV SOLN
150.0000 mg | Freq: Once | INTRAVENOUS | Status: AC
  Administered 2021-06-25: 150 mg via INTRAVENOUS
  Filled 2021-06-25: qty 150

## 2021-06-25 MED ORDER — SODIUM CHLORIDE 0.9 % IV SOLN
Freq: Once | INTRAVENOUS | Status: AC
  Filled 2021-06-25: qty 250

## 2021-06-25 MED ORDER — SODIUM CHLORIDE 0.9 % IV SOLN
100.0000 mg/m2 | Freq: Once | INTRAVENOUS | Status: AC
  Administered 2021-06-25: 170 mg via INTRAVENOUS
  Filled 2021-06-25: qty 8.5

## 2021-06-25 NOTE — Patient Instructions (Signed)
[facility]  Discharge Instructions: Thank you for choosing [facility] to provide your oncology and hematology care.  If you have a lab appointment with the [facility], please go directly to the [facility] and check in at the registration area.  Wear comfortable clothing and clothing appropriate for easy access to any Portacath or PICC line.   We strive to give you quality time with your provider. You may need to reschedule your appointment if you arrive late (15 or more minutes).  Arriving late affects you and other patients whose appointments are after yours.  Also, if you miss three or more appointments without notifying the office, you may be dismissed from the clinic at the provider's discretion.      For prescription refill requests, have your pharmacy contact our office and allow 72 hours for refills to be completed.    Today you received the following chemotherapy and/or immunotherapy agents: Tecentriq, Carboplatin, Etoposide      To help prevent nausea and vomiting after your treatment, we encourage you to take your nausea medication as directed.  BELOW ARE SYMPTOMS THAT SHOULD BE REPORTED IMMEDIATELY: *FEVER GREATER THAN 100.4 F (38 C) OR HIGHER *CHILLS OR SWEATING *NAUSEA AND VOMITING THAT IS NOT CONTROLLED WITH YOUR NAUSEA MEDICATION *UNUSUAL SHORTNESS OF BREATH *UNUSUAL BRUISING OR BLEEDING *URINARY PROBLEMS (pain or burning when urinating, or frequent urination) *BOWEL PROBLEMS (unusual diarrhea, constipation, pain near the anus) TENDERNESS IN MOUTH AND THROAT WITH OR WITHOUT PRESENCE OF ULCERS (sore throat, sores in mouth, or a toothache) UNUSUAL RASH, SWELLING OR PAIN  UNUSUAL VAGINAL DISCHARGE OR ITCHING   Items with * indicate a potential emergency and should be followed up as soon as possible or go to the Emergency Department if any problems should occur.  Please show the CHEMOTHERAPY ALERT CARD or IMMUNOTHERAPY  ALERT CARD at check-in to the Emergency Department and triage nurse.  Should you have questions after your visit or need to cancel or reschedule your appointment, please contact [facility]  [phone] and follow the prompts.  Office hours are 8:00 a.m. to 4:30 p.m. Monday - Friday. Please note that voicemails left after 4:00 p.m. may not be returned until the following business day.  We are closed weekends and major holidays. You have access to a nurse at all times for urgent questions. Please call the main number to the clinic [phone] and follow the prompts.  For any non-urgent questions, you may also contact your provider using MyChart. We now offer e-Visits for anyone 29 and older to request care online for non-urgent symptoms. For details visit [email or web address].   Also download the MyChart app! Go to the app store, search "MyChart", open the app, select [facility], and log in with your MyChart username and password.  Due to Covid, a mask is required upon entering the hospital/clinic. If you do not have a mask, one will be given to you upon arrival. For doctor visits, patients may have 1 support person aged 42 or older with them. For treatment visits, patients cannot have anyone with them due to current Covid guidelines and our immunocompromised population. Etoposide, VP-16 injection What is this medication? ETOPOSIDE, VP-16 (e toe POE side) is a chemotherapy drug. It is used to treat testicular cancer, lung cancer, and other cancers. This medicine may be used for other purposes; ask your health care provider or pharmacist if you have questions. COMMON BRAND NAME(S): Etopophos, Toposar, VePesid What  should I tell my care team before I take this medication? They need to know if you have any of these conditions: infection kidney disease liver disease low blood counts, like low white cell, platelet, or red cell counts an unusual or allergic  reaction to etoposide, other medicines, foods, dyes, or preservatives pregnant or trying to get pregnant breast-feeding How should I use this medication? This medicine is for infusion into a vein. It is administered in a hospital or clinic by a specially trained health care professional. Talk to your pediatrician regarding the use of this medicine in children. Special care may be needed. Overdosage: If you think you have taken too much of this medicine contact a poison control center or emergency room at once. NOTE: This medicine is only for you. Do not share this medicine with others. What if I miss a dose? It is important not to miss your dose. Call your doctor or health care professional if you are unable to keep an appointment. What may interact with this medication? This medicine may interact with the following medications: warfarin This list may not describe all possible interactions. Give your health care provider a list of all the medicines, herbs, non-prescription drugs, or dietary supplements you use. Also tell them if you smoke, drink alcohol, or use illegal drugs. Some items may interact with your medicine. What should I watch for while using this medication? Visit your doctor for checks on your progress. This drug may make you feel generally unwell. This is not uncommon, as chemotherapy can affect healthy cells as well as cancer cells. Report any side effects. Continue your course of treatment even though you feel ill unless your doctor tells you to stop. In some cases, you may be given additional medicines to help with side effects. Follow all directions for their use. Call your doctor or health care professional for advice if you get a fever, chills or sore throat, or other symptoms of a cold or flu. Do not treat yourself. This drug decreases your body's ability to fight infections. Try to avoid being around people who are sick. This medicine may increase your risk to bruise or  bleed. Call your doctor or health care professional if you notice any unusual bleeding. Talk to your doctor about your risk of cancer. You may be more at risk for certain types of cancers if you take this medicine. Do not become pregnant while taking this medicine or for at least 6 months after stopping it. Women should inform their doctor if they wish to become pregnant or think they might be pregnant. Women of child-bearing potential will need to have a negative pregnancy test before starting this medicine. There is a potential for serious side effects to an unborn child. Talk to your health care professional or pharmacist for more information. Do not breast-feed an infant while taking this medicine. Men must use a latex condom during sexual contact with a woman while taking this medicine and for at least 4 months after stopping it. A latex condom is needed even if you have had a vasectomy. Contact your doctor right away if your partner becomes pregnant. Do not donate sperm while taking this medicine and for at least 4 months after you stop taking this medicine. Men should inform their doctors if they wish to father a child. This medicine may lower sperm counts. What side effects may I notice from receiving this medication? Side effects that you should report to your doctor or health care professional  as soon as possible: allergic reactions like skin rash, itching or hives, swelling of the face, lips, or tongue low blood counts - this medicine may decrease the number of white blood cells, red blood cells, and platelets. You may be at increased risk for infections and bleeding nausea, vomiting redness, blistering, peeling or loosening of the skin, including inside the mouth signs and symptoms of infection like fever; chills; cough; sore throat; pain or trouble passing urine signs and symptoms of low red blood cells or anemia such as unusually weak or tired; feeling faint or lightheaded; falls; breathing  problems unusual bruising or bleeding Side effects that usually do not require medical attention (report to your doctor or health care professional if they continue or are bothersome): changes in taste diarrhea hair loss loss of appetite mouth sores This list may not describe all possible side effects. Call your doctor for medical advice about side effects. You may report side effects to FDA at 1-800-FDA-1088. Where should I keep my medication? This drug is given in a hospital or clinic and will not be stored at home. NOTE: This sheet is a summary. It may not cover all possible information. If you have questions about this medicine, talk to your doctor, pharmacist, or health care provider.  2022 Elsevier/Gold Standard (2018-10-14 16:57:15) Carboplatin injection What is this medication? CARBOPLATIN (KAR boe pla tin) is a chemotherapy drug. It targets fast dividing cells, like cancer cells, and causes these cells to die. This medicine is used to treat ovarian cancer and many other cancers. This medicine may be used for other purposes; ask your health care provider or pharmacist if you have questions. COMMON BRAND NAME(S): Paraplatin What should I tell my care team before I take this medication? They need to know if you have any of these conditions: blood disorders hearing problems kidney disease recent or ongoing radiation therapy an unusual or allergic reaction to carboplatin, cisplatin, other chemotherapy, other medicines, foods, dyes, or preservatives pregnant or trying to get pregnant breast-feeding How should I use this medication? This drug is usually given as an infusion into a vein. It is administered in a hospital or clinic by a specially trained health care professional. Talk to your pediatrician regarding the use of this medicine in children. Special care may be needed. Overdosage: If you think you have taken too much of this medicine contact a poison control center or  emergency room at once. NOTE: This medicine is only for you. Do not share this medicine with others. What if I miss a dose? It is important not to miss a dose. Call your doctor or health care professional if you are unable to keep an appointment. What may interact with this medication? medicines for seizures medicines to increase blood counts like filgrastim, pegfilgrastim, sargramostim some antibiotics like amikacin, gentamicin, neomycin, streptomycin, tobramycin vaccines Talk to your doctor or health care professional before taking any of these medicines: acetaminophen aspirin ibuprofen ketoprofen naproxen This list may not describe all possible interactions. Give your health care provider a list of all the medicines, herbs, non-prescription drugs, or dietary supplements you use. Also tell them if you smoke, drink alcohol, or use illegal drugs. Some items may interact with your medicine. What should I watch for while using this medication? Your condition will be monitored carefully while you are receiving this medicine. You will need important blood work done while you are taking this medicine. This drug may make you feel generally unwell. This is not uncommon, as chemotherapy  can affect healthy cells as well as cancer cells. Report any side effects. Continue your course of treatment even though you feel ill unless your doctor tells you to stop. In some cases, you may be given additional medicines to help with side effects. Follow all directions for their use. Call your doctor or health care professional for advice if you get a fever, chills or sore throat, or other symptoms of a cold or flu. Do not treat yourself. This drug decreases your body's ability to fight infections. Try to avoid being around people who are sick. This medicine may increase your risk to bruise or bleed. Call your doctor or health care professional if you notice any unusual bleeding. Be careful brushing and flossing  your teeth or using a toothpick because you may get an infection or bleed more easily. If you have any dental work done, tell your dentist you are receiving this medicine. Avoid taking products that contain aspirin, acetaminophen, ibuprofen, naproxen, or ketoprofen unless instructed by your doctor. These medicines may hide a fever. Do not become pregnant while taking this medicine. Women should inform their doctor if they wish to become pregnant or think they might be pregnant. There is a potential for serious side effects to an unborn child. Talk to your health care professional or pharmacist for more information. Do not breast-feed an infant while taking this medicine. What side effects may I notice from receiving this medication? Side effects that you should report to your doctor or health care professional as soon as possible: allergic reactions like skin rash, itching or hives, swelling of the face, lips, or tongue signs of infection - fever or chills, cough, sore throat, pain or difficulty passing urine signs of decreased platelets or bleeding - bruising, pinpoint red spots on the skin, black, tarry stools, nosebleeds signs of decreased red blood cells - unusually weak or tired, fainting spells, lightheadedness breathing problems changes in hearing changes in vision chest pain high blood pressure low blood counts - This drug may decrease the number of white blood cells, red blood cells and platelets. You may be at increased risk for infections and bleeding. nausea and vomiting pain, swelling, redness or irritation at the injection site pain, tingling, numbness in the hands or feet problems with balance, talking, walking trouble passing urine or change in the amount of urine Side effects that usually do not require medical attention (report to your doctor or health care professional if they continue or are bothersome): hair loss loss of appetite metallic taste in the mouth or changes in  taste This list may not describe all possible side effects. Call your doctor for medical advice about side effects. You may report side effects to FDA at 1-800-FDA-1088. Where should I keep my medication? This drug is given in a hospital or clinic and will not be stored at home. NOTE: This sheet is a summary. It may not cover all possible information. If you have questions about this medicine, talk to your doctor, pharmacist, or health care provider.  2022 Elsevier/Gold Standard (2007-11-24 14:38:05) Atezolizumab injection What is this medication? ATEZOLIZUMAB (a te zoe LIZ ue mab) is a monoclonal antibody. It is used to treat bladder cancer (urothelial cancer), liver cancer, lung cancer, and melanoma. This medicine may be used for other purposes; ask your health care provider or pharmacist if you have questions. COMMON BRAND NAME(S): Tecentriq What should I tell my care team before I take this medication? They need to know if you have any  of these conditions: autoimmune diseases like Crohn's disease, ulcerative colitis, or lupus have had or planning to have an allogeneic stem cell transplant (uses someone else's stem cells) history of organ transplant history of radiation to the chest nervous system problems like myasthenia gravis or Guillain-Barre syndrome an unusual or allergic reaction to atezolizumab, other medicines, foods, dyes, or preservatives pregnant or trying to get pregnant breast-feeding How should I use this medication? This medicine is for infusion into a vein. It is given by a health care professional in a hospital or clinic setting. A special MedGuide will be given to you before each treatment. Be sure to read this information carefully each time. Talk to your pediatrician regarding the use of this medicine in children. Special care may be needed. Overdosage: If you think you have taken too much of this medicine contact a poison control center or emergency room at  once. NOTE: This medicine is only for you. Do not share this medicine with others. What if I miss a dose? It is important not to miss your dose. Call your doctor or health care professional if you are unable to keep an appointment. What may interact with this medication? Interactions have not been studied. This list may not describe all possible interactions. Give your health care provider a list of all the medicines, herbs, non-prescription drugs, or dietary supplements you use. Also tell them if you smoke, drink alcohol, or use illegal drugs. Some items may interact with your medicine. What should I watch for while using this medication? Your condition will be monitored carefully while you are receiving this medicine. You may need blood work done while you are taking this medicine. Do not become pregnant while taking this medicine or for at least 5 months after stopping it. Women should inform their doctor if they wish to become pregnant or think they might be pregnant. There is a potential for serious side effects to an unborn child. Talk to your health care professional or pharmacist for more information. Do not breast-feed an infant while taking this medicine or for at least 5 months after the last dose. What side effects may I notice from receiving this medication? Side effects that you should report to your doctor or health care professional as soon as possible: allergic reactions like skin rash, itching or hives, swelling of the face, lips, or tongue black, tarry stools bloody or watery diarrhea breathing problems changes in vision chest pain or chest tightness chills facial flushing fever headache signs and symptoms of high blood sugar such as dizziness; dry mouth; dry skin; fruity breath; nausea; stomach pain; increased hunger or thirst; increased urination signs and symptoms of liver injury like dark yellow or brown urine; general ill feeling or flu-like symptoms; light-colored  stools; loss of appetite; nausea; right upper belly pain; unusually weak or tired; yellowing of the eyes or skin stomach pain trouble passing urine or change in the amount of urine Side effects that usually do not require medical attention (report to your doctor or health care professional if they continue or are bothersome): bone pain cough diarrhea joint pain muscle pain muscle weakness swelling of arms or legs tiredness weight loss This list may not describe all possible side effects. Call your doctor for medical advice about side effects. You may report side effects to FDA at 1-800-FDA-1088. Where should I keep my medication? This drug is given in a hospital or clinic and will not be stored at home. NOTE: This sheet is a summary.  It may not cover all possible information. If you have questions about this medicine, talk to your doctor, pharmacist, or health care provider.  2022 Elsevier/Gold Standard (2020-05-18 13:59:34)

## 2021-06-25 NOTE — Telephone Encounter (Signed)
PA for Lidocaine- prilocaine submitted via [email or web address].   keyViona Gilmore Rx# [id number]

## 2021-06-25 NOTE — Progress Notes (Signed)
Hematology/Oncology progress note [facility] [phone] [phone]   Patient Care Team: Delsa Grana, PA-C as PCP - General (Family Medicine) Telford Nab, RN as Oncology Nurse Navigator  REFERRING PROVIDER: Delsa Grana, PA-C  CHIEF COMPLAINTS/REASON FOR VISIT:  Extensive small cell lung cancer  HISTORY OF PRESENTING ILLNESS:   Jenna Newton is a  55 y.o.  female with PMH listed below was seen in consultation at the request of  Delsa Grana, PA-C  for evaluation of lung mass  05/08/2021, patient presented to emergency room for evaluation of expressive aphasia.  Acute onset of symptoms.  She/has no other neurological deficits at that time.  In ER, patient was found to have expressive aphasia with intermittent word at rest, though receptive and follows commands. MRI brain with and without contrast showed multiple small peripherally enhancing lesions.  Primary differential consideration is metastatic disease.   Patient declined being admitted.  Was eventually convinced to stay to do CT chest abdomen pelvis with contrast. CT showed a 3.2 cm posterior left upper lobe mass extending from the left hilum, concerning for primary bronchogenic carcinoma.  Enlarged left hilar and prominent bilateral peritracheal lymph nodes are identified.  Worrisome for nodal metastasis.  Similar appearance of bilateral adrenal nodule compared with 12/17/2013.  Favor benign adenomas.  Coronary artery calcifications, enlarged fibroid uterus. Patient prefers to be discharged home.  She was sent on dexamethasone 4 mg twice daily.  And Keppra 500 mg twice daily.  Patient was referred to establish care with cancer center. Today she has no difficulty in speech.  She was accompanied by her cousin. She has some gait change with right foot drop. No additional episodes of expressive aphasia.  Patient drinks alcohol, 12 packs of beer /week, current every day smoker.   05/25/2021  left upper lobe biopsy via bronchoscopy showed small cell lung cancer. left upper lobe brushing/ LUB lavage + small cell lung cancer,  Precarinal lymph node biopsy- not enough tissue  Patient has baseline alopecia 05/30/2021- 06/13/2021 palliative brain RT.  06/04/2021 carboplatin etoposide  INTERVAL HISTORY Jenna Newton is a 55 y.o. female who has above history reviewed by me today presents for follow up visit for management of extensive small cell lung cancer  S/p cycle 1 carboplatin etoposide.  Tolerates well.  Patient has had a Mediport placed  during the interval.  O currently on dexamethasone 2 mg daily.   Patient is also on Keppra 500 mg twice daily.   Hepatitis.  She has gained weight.  Gait problem and speech difficulty have improved.  Review of Systems  Constitutional:  Negative for appetite change, chills, fatigue and fever.  HENT:   Negative for hearing loss and voice change.        Alopecia-chronic  Eyes:  Negative for eye problems.  Respiratory:  Negative for chest tightness and cough.   Cardiovascular:  Negative for chest pain.  Gastrointestinal:  Negative for abdominal distention, abdominal pain and blood in stool.  Endocrine: Negative for hot flashes.  Genitourinary:  Negative for difficulty urinating and frequency.   Musculoskeletal:  Negative for arthralgias and gait problem.  Skin:  Negative for itching and rash.  Neurological:  Negative for extremity weakness, gait problem, headaches, seizures and speech difficulty.  Hematological:  Negative for adenopathy.  Psychiatric/Behavioral:  Negative for confusion.    MEDICAL HISTORY:  Past Medical History:  Diagnosis Date   Anemia    GERD (gastroesophageal reflux disease)    Hypertension    Neuropathy  Tobacco abuse 03/14/2017   Vertigo    White matter disease of brain due to ischemia 12/04/2017   Head CT March 2019    SURGICAL HISTORY: Past Surgical History:  Procedure Laterality Date   none     PORTA  CATH INSERTION N/A 06/18/2021   Procedure: PORTA CATH INSERTION;  Surgeon: Algernon Huxley, MD;  Location: [place] [facility];  Service: Cardiovascular;  Laterality: N/A;   VIDEO BRONCHOSCOPY WITH ENDOBRONCHIAL NAVIGATION N/A 05/25/2021   Procedure: ROBOTIC ASSISTED VIDEO BRONCHOSCOPY WITH ENDOBRONCHIAL NAVIGATION;  Surgeon: Tyler Pita, MD;  Location: [facility];  Service: Pulmonary;  Laterality: N/A;   VIDEO BRONCHOSCOPY WITH ENDOBRONCHIAL ULTRASOUND N/A 05/25/2021   Procedure: VIDEO BRONCHOSCOPY WITH ENDOBRONCHIAL ULTRASOUND;  Surgeon: Tyler Pita, MD;  Location: [facility];  Service: Pulmonary;  Laterality: N/A;    SOCIAL HISTORY: Social History   Socioeconomic History   Marital status: Single    Spouse name: Not on file   Number of children: 0   Years of education: Not on file   Highest education level: Bachelor's degree (e.g., BA, AB, BS)  Occupational History   Not on file  Tobacco Use   Smoking status: Every Day    Packs/day: 1.00    Years: 25.00    Pack years: 25.00    Types: Cigarettes   Smokeless tobacco: Never  Vaping Use   Vaping Use: Never used  Substance and Sexual Activity   Alcohol use: Yes    Alcohol/week: 14.0 standard drinks    Types: 14 Cans of beer per week    Comment: 12 pack/ week   Drug use: No   Sexual activity: Yes    Partners: Male    Birth control/protection: Post-menopausal  Other Topics Concern   Not on file  Social History Narrative   Lives with brother   Social Determinants of Health   Financial Resource Strain: Not on file  Food Insecurity: Food Insecurity Present   Worried About Charity fundraiser in the Last Year: Sometimes true   Arboriculturist in the Last Year: Sometimes true  Transportation Needs: Not on file  Physical Activity: Not on file  Stress: Not on file  Social Connections: Not on file  Intimate Partner Violence: Not on file    FAMILY HISTORY: Family History  Problem Relation Age of Onset   Diabetes  Mother    Hypertension Mother    Emphysema Mother    Emphysema Father    Diabetes Brother    Alcohol abuse Brother    Diabetes Maternal Grandmother    Hypertension Maternal Grandmother    Blindness Maternal Grandmother    Cancer Maternal Grandfather        prostate   Emphysema Maternal Grandfather    Cancer Paternal Grandmother    Emphysema Paternal Grandfather     ALLERGIES:  is allergic to penicillins, meclizine, and sulfa antibiotics.  MEDICATIONS:  Current Outpatient Medications  Medication Sig Dispense Refill   amLODipine (NORVASC) 5 MG tablet Take 1 tablet (5 mg total) by mouth daily. 90 tablet 3   cromolyn (OPTICROM) 4 % ophthalmic solution Place 1 drop into both eyes 4 (four) times daily as needed. 10 mL 3   dexamethasone (DECADRON) 1 MG tablet Take 1 tablet (1 mg total) by mouth See admin instructions. Take 3mg  daily for 1 week and then take 2mg  daily for 1 week. 35 tablet 0   [START ON 06/30/2021] dexamethasone (DECADRON) 1 MG tablet Take 1 tablet (1  mg total) by mouth daily. 14 tablet 0   fluticasone (FLONASE) 50 MCG/ACT nasal spray Place 1 spray into both nostrils daily as needed for allergies.     lidocaine-prilocaine (EMLA) cream Apply 1 application topically as needed. 30 g 3   montelukast (SINGULAIR) 10 MG tablet Take 1 tablet (10 mg total) by mouth at bedtime. 90 tablet 3   potassium chloride (KLOR-CON) 10 MEQ tablet Take 1 tablet (10 mEq total) by mouth daily. 30 tablet 0   saline (AYR) GEL Place 1 application into the nose every 4 (four) hours. 14.1 g 3   vitamin B-12 (CYANOCOBALAMIN) 500 MCG tablet Take 1 tablet (500 mcg total) by mouth daily. 100 tablet 3   levETIRAcetam (KEPPRA) 500 MG tablet Take 1 tablet (500 mg total) by mouth 2 (two) times daily. 120 tablet 0   meclizine (ANTIVERT) 25 MG tablet Take 1 tablet (25 mg total) by mouth 3 (three) times daily as needed for dizziness (vertigo). (Patient not taking: No sig reported) 90 tablet 11   ondansetron  (ZOFRAN) 8 MG tablet Take 1 tablet (8 mg total) by mouth 2 (two) times daily as needed for refractory nausea / vomiting. Start on day 3 after carboplatin chemo. (Patient not taking: Reported on 06/25/2021) 30 tablet 1   prochlorperazine (COMPAZINE) 10 MG tablet Take 1 tablet (10 mg total) by mouth every 6 (six) hours as needed (Nausea or vomiting). (Patient not taking: Reported on 06/25/2021) 30 tablet 1   Tiotropium Bromide-Olodaterol (STIOLTO RESPIMAT) 2.5-2.5 MCG/ACT AERS Inhale 2 puffs into the lungs daily. (Patient not taking: Reported on 06/25/2021) 1 each 0   No current facility-administered medications for this visit.   Facility-Administered Medications Ordered in Other Visits  Medication Dose Route Frequency Provider Last Rate Last Admin   heparin lock flush 100 UNIT/ML injection            sodium chloride flush (NS) 0.9 % injection 10 mL  10 mL Intravenous PRN Earlie Server, MD   10 mL at 06/25/21 0904     PHYSICAL EXAMINATION: ECOG PERFORMANCE STATUS: 1 - Symptomatic but completely ambulatory Vitals:   06/25/21 0931  BP: 110/68  Pulse: 89  Resp: 18  Temp: 98.4 F (36.9 C)  SpO2: 99%   Filed Weights   06/25/21 0931  Weight: 142 lb 3.2 oz (64.5 kg)     Physical Exam Constitutional:      General: She is not in acute distress. HENT:     Head: Normocephalic and atraumatic.  Eyes:     General: No scleral icterus. Cardiovascular:     Rate and Rhythm: Normal rate and regular rhythm.     Heart sounds: Normal heart sounds.  Pulmonary:     Effort: Pulmonary effort is normal. No respiratory distress.     Breath sounds: No wheezing.  Abdominal:     General: Bowel sounds are normal. There is no distension.     Palpations: Abdomen is soft.  Musculoskeletal:        General: No deformity. Normal range of motion.     Cervical back: Normal range of motion and neck supple.  Skin:    General: Skin is warm and dry.     Findings: No erythema or rash.  Neurological:     Mental  Status: She is alert and oriented to person, place, and time. Mental status is at baseline.     Cranial Nerves: No cranial nerve deficit.     Coordination: Coordination normal.  Comments: Gait has improved.   Psychiatric:        Mood and Affect: Mood normal.    LABORATORY DATA:  I have reviewed the data as listed Lab Results  Component Value Date   WBC 11.1 (H) 06/25/2021   HGB 11.3 (L) 06/25/2021   HCT 35.1 (L) 06/25/2021   MCV 88.4 06/25/2021   PLT 379 06/25/2021   Recent Labs    06/04/21 0929 06/14/21 0948 06/25/21 0858  NA 136 135 135  K 4.0 3.4* 4.0  CL 97* 106 101  CO2 30 21* 29  GLUCOSE 109* 108* 108*  BUN 11 7 14   CREATININE 0.37* 0.34* <0.30*  CALCIUM 9.1 8.2* 9.4  GFRNONAA >60 >60 NOT CALCULATED  PROT 7.1 6.5 7.3  ALBUMIN 4.0 3.8 4.0  AST 21 19 20   ALT 24 22 30   ALKPHOS 51 53 52  BILITOT 0.4 <0.1* 0.3    Iron/TIBC/Ferritin/ %Sat    Component Value Date/Time   FERRITIN 126 03/14/2017 0926       RADIOGRAPHIC STUDIES: I have personally reviewed the radiological images as listed and agreed with the findings in the report. PERIPHERAL VASCULAR CATHETERIZATION  Result Date: 06/18/2021 See surgical note for result.     ASSESSMENT & PLAN:  1. Small cell lung cancer ([place])   2. Encounter for antineoplastic chemotherapy   3. Encounter for antineoplastic immunotherapy   4. Seizure ([place])   5. Antineoplastic chemotherapy induced anemia   Cancer Staging Small cell lung cancer [facility]) Staging form: Lung, AJCC 8th Edition - Clinical stage from 06/01/2021: Stage IV (cT2a, cN3, cM1) - Signed by Earlie Server, MD on 06/01/2021  # Extensive small cell cancer, brain metastatic disease S/p 1 cycle of carboplatin + etoposide Finished RT. She tolerates well. Labs reviewed and discussed with patient Proceed with cycle 2 carboplatin/etoposide.  Add Tecentriq.  Rationale and potential side effects were discussed with patient.  # Brain metastasis and seizure S/p RT,  steroid taper. Keppra 500mg  BID.  Prescription was sent to pharmacy. Continue Dexamethasone 2mg  daily, followed by 1mg  daily x 1 week, followed by 1 mg every other day x1 week and then stop.  # Anemia, chemotherapy induced.   Supportive care measures are necessary for patient well-being and will be provided as necessary. We spent sufficient time to discuss many aspect of care, questions were answered to patient's satisfaction.  Follow up 3 weeks lab MD carboplatin/Etoposide/tecentriq    All questions were answered. The patient knows to call the clinic with any problems questions or concerns.  cc Delsa Grana, PA-C     Earlie Server, MD, PhD Hematology Oncology [facility] at [facility]  06/25/2021

## 2021-06-25 NOTE — Progress Notes (Signed)
Pt here for folllow up. No new concerns voiced. Denies shortness of breath.

## 2021-06-26 ENCOUNTER — Inpatient Hospital Stay: Payer: BC Managed Care – PPO

## 2021-06-26 VITALS — BP 118/81 | HR 77 | Temp 97.0°F | Resp 19

## 2021-06-26 DIAGNOSIS — C7931 Secondary malignant neoplasm of brain: Secondary | ICD-10-CM | POA: Diagnosis not present

## 2021-06-26 DIAGNOSIS — Z510 Encounter for antineoplastic radiation therapy: Secondary | ICD-10-CM | POA: Diagnosis not present

## 2021-06-26 DIAGNOSIS — C3412 Malignant neoplasm of upper lobe, left bronchus or lung: Secondary | ICD-10-CM | POA: Diagnosis not present

## 2021-06-26 DIAGNOSIS — C3490 Malignant neoplasm of unspecified part of unspecified bronchus or lung: Secondary | ICD-10-CM

## 2021-06-26 MED ORDER — SODIUM CHLORIDE 0.9 % IV SOLN
Freq: Once | INTRAVENOUS | Status: AC
  Filled 2021-06-26: qty 250

## 2021-06-26 MED ORDER — SODIUM CHLORIDE 0.9 % IV SOLN
100.0000 mg/m2 | Freq: Once | INTRAVENOUS | Status: AC
  Administered 2021-06-26: 170 mg via INTRAVENOUS
  Filled 2021-06-26: qty 8.5

## 2021-06-26 MED ORDER — SODIUM CHLORIDE 0.9 % IV SOLN
10.0000 mg | Freq: Once | INTRAVENOUS | Status: AC
  Administered 2021-06-26: 10 mg via INTRAVENOUS
  Filled 2021-06-26: qty 10

## 2021-06-26 MED ORDER — HEPARIN SOD (PORK) LOCK FLUSH 100 UNIT/ML IV SOLN
500.0000 [IU] | Freq: Once | INTRAVENOUS | Status: DC | PRN
  Filled 2021-06-26: qty 5

## 2021-06-26 MED ORDER — HEPARIN SOD (PORK) LOCK FLUSH 100 UNIT/ML IV SOLN
INTRAVENOUS | Status: AC
  Administered 2021-06-26: 500 [IU]
  Filled 2021-06-26: qty 5

## 2021-06-26 NOTE — Patient Instructions (Signed)
[facility] ONCOLOG[facility]  Discharge Instructions: Thank you for choosing [facility] to provide your oncology and hematology care.  If you have a lab appointment with the [facility] [facility], please go directly to the [facility] and check in at the registration area.  Wear comfortable clothing and clothing appropriate for easy access to any Portacath or PICC line.   We strive to give you quality time with your provider. You may need to reschedule your appointment if you arrive late (15 or more minutes).  Arriving late affects you and other patients whose appointments are after yours.  Also, if you miss three or more appointments without notifying the office, you may be dismissed from the clinic at the provider's discretion.      For prescription refill requests, have your pharmacy contact our office and allow 72 hours for refills to be completed.    Today you received the following chemotherapy and/or immunotherapy agents etoposide    To help prevent nausea and vomiting after your treatment, we encourage you to take your nausea medication as directed.  BELOW ARE SYMPTOMS THAT SHOULD BE REPORTED IMMEDIATELY: *FEVER GREATER THAN 100.4 F (38 C) OR HIGHER *CHILLS OR SWEATING *NAUSEA AND VOMITING THAT IS NOT CONTROLLED WITH YOUR NAUSEA MEDICATION *UNUSUAL SHORTNESS OF BREATH *UNUSUAL BRUISING OR BLEEDING *URINARY PROBLEMS (pain or burning when urinating, or frequent urination) *BOWEL PROBLEMS (unusual diarrhea, constipation, pain near the anus) TENDERNESS IN MOUTH AND THROAT WITH OR WITHOUT PRESENCE OF ULCERS (sore throat, sores in mouth, or a toothache) UNUSUAL RASH, SWELLING OR PAIN  UNUSUAL VAGINAL DISCHARGE OR ITCHING   Items with * indicate a potential emergency and should be followed up as soon as possible or go to the Emergency Department if any problems should occur.  Please show the CHEMOTHERAPY ALERT CARD or IMMUNOTHERAPY ALERT CARD at check-in to  the Emergency Department and triage nurse.  Should you have questions after your visit or need to cancel or reschedule your appointment, please contact [facility]  [phone] and follow the prompts.  Office hours are 8:00 a.m. to 4:30 p.m. Monday - Friday. Please note that voicemails left after 4:00 p.m. may not be returned until the following business day.  We are closed weekends and major holidays. You have access to a nurse at all times for urgent questions. Please call the main number to the clinic [phone] and follow the prompts.  For any non-urgent questions, you may also contact your provider using MyChart. We now offer e-Visits for anyone 75 and older to request care online for non-urgent symptoms. For details visit [email or web address].   Also download the MyChart app! Go to the app store, search "MyChart", open the app, select [facility] [place], and log in with your MyChart username and password.  Due to Covid, a mask is required upon entering the hospital/clinic. If you do not have a mask, one will be given to you upon arrival. For doctor visits, patients may have 1 support person aged 46 or older with them. For treatment visits, patients cannot have anyone with them due to current Covid guidelines and our immunocompromised population.

## 2021-06-27 ENCOUNTER — Other Ambulatory Visit: Payer: Self-pay

## 2021-06-27 ENCOUNTER — Encounter: Payer: Self-pay | Admitting: *Deleted

## 2021-06-27 ENCOUNTER — Inpatient Hospital Stay: Payer: BC Managed Care – PPO

## 2021-06-27 VITALS — BP 126/70 | HR 67 | Temp 97.3°F | Resp 18

## 2021-06-27 DIAGNOSIS — C3412 Malignant neoplasm of upper lobe, left bronchus or lung: Secondary | ICD-10-CM | POA: Diagnosis not present

## 2021-06-27 DIAGNOSIS — C3490 Malignant neoplasm of unspecified part of unspecified bronchus or lung: Secondary | ICD-10-CM

## 2021-06-27 DIAGNOSIS — C7931 Secondary malignant neoplasm of brain: Secondary | ICD-10-CM | POA: Diagnosis not present

## 2021-06-27 DIAGNOSIS — Z510 Encounter for antineoplastic radiation therapy: Secondary | ICD-10-CM | POA: Diagnosis not present

## 2021-06-27 MED ORDER — HEPARIN SOD (PORK) LOCK FLUSH 100 UNIT/ML IV SOLN
INTRAVENOUS | Status: AC
  Filled 2021-06-27: qty 5

## 2021-06-27 MED ORDER — HEPARIN SOD (PORK) LOCK FLUSH 100 UNIT/ML IV SOLN
500.0000 [IU] | Freq: Once | INTRAVENOUS | Status: DC | PRN
  Filled 2021-06-27: qty 5

## 2021-06-27 MED ORDER — SODIUM CHLORIDE 0.9 % IV SOLN
100.0000 mg/m2 | Freq: Once | INTRAVENOUS | Status: AC
  Administered 2021-06-27: 170 mg via INTRAVENOUS
  Filled 2021-06-27: qty 8.5

## 2021-06-27 MED ORDER — SODIUM CHLORIDE 0.9 % IV SOLN
10.0000 mg | Freq: Once | INTRAVENOUS | Status: AC
  Administered 2021-06-27: 10 mg via INTRAVENOUS
  Filled 2021-06-27: qty 1

## 2021-06-27 MED ORDER — SODIUM CHLORIDE 0.9 % IV SOLN
Freq: Once | INTRAVENOUS | Status: AC
  Filled 2021-06-27: qty 250

## 2021-06-27 MED ORDER — SODIUM CHLORIDE 0.9% FLUSH
10.0000 mL | INTRAVENOUS | Status: DC | PRN
  Filled 2021-06-27: qty 10

## 2021-06-27 NOTE — Progress Notes (Signed)
Nutrition Assessment:  Add on as patient requesting ensure  55 year old female with extensive lung cancer. Past medical history of GERD, HTN, anemia, smoker.  Patient receiving carboplatin, etoposide, tecentriq.    Met with patient in infusion.  She was eating sandwich and chips.  Reports that appetite is good now that she is on steroids (dexamethasone).  Denies any nutrition impact symptoms.  Requesting assistance with ensure as does not want to loose weight.    Medications: dexamethasone, Vit B 12, compazine, zofran, KCL  Labs: glucose 108  Anthropometrics:   Height: 66 inches Weight: 142 lb 3.2 oz 138 lb on 10/17 124 lb on 9/23 BMI: 22 Weight gain recently  Estimated Energy Needs  Kcals: 1600-1900 Protein: 80-95 g Fluid: 1.6 L  NUTRITION DIAGNOSIS: Food and nutrition related knowledge deficit related to cancer diagnosis and interested in improving nutritional status   INTERVENTION:  Discussed importance of eating good sources of protein and including plant foods. Complimentary case of ensure enlive given to patient along with coupons.   Patient does not have to drink daily if eating well and maintaining weight Contact information given and handout on protein foods    MONITORING, EVALUATION, GOAL: weight trends, intake   NEXT VISIT: Wednesday, Nov 16 during infusion  Amilee Janvier B. Zenia Resides, [place], [place] Registered Dietitian [phone] (mobile)

## 2021-06-28 NOTE — Telephone Encounter (Signed)
Received denial letter for Lidocaine-prilocaine cream. Called pharmacy intake at [facility] and they said a letter needs to be sent requesting Cream approval.  Letter faxed to [facility] @ [phone]. Letter available in LETTERS tab.

## 2021-07-03 NOTE — Telephone Encounter (Signed)
Called [employer] to follow up on PA. They stated that is usually takes 5-7 days before a response and they will fax it to [place].

## 2021-07-04 ENCOUNTER — Encounter: Payer: Self-pay | Admitting: Oncology

## 2021-07-04 NOTE — Telephone Encounter (Signed)
Medication approved from 06/28/21 to 09/25/21. Pharmacy notified of approval.

## 2021-07-05 ENCOUNTER — Other Ambulatory Visit: Payer: Self-pay

## 2021-07-05 ENCOUNTER — Inpatient Hospital Stay: Payer: BC Managed Care – PPO | Attending: Oncology

## 2021-07-05 ENCOUNTER — Inpatient Hospital Stay: Payer: BC Managed Care – PPO

## 2021-07-05 DIAGNOSIS — C7931 Secondary malignant neoplasm of brain: Secondary | ICD-10-CM | POA: Insufficient documentation

## 2021-07-05 DIAGNOSIS — Z5112 Encounter for antineoplastic immunotherapy: Secondary | ICD-10-CM | POA: Insufficient documentation

## 2021-07-05 DIAGNOSIS — F17210 Nicotine dependence, cigarettes, uncomplicated: Secondary | ICD-10-CM | POA: Diagnosis not present

## 2021-07-05 DIAGNOSIS — Z8042 Family history of malignant neoplasm of prostate: Secondary | ICD-10-CM | POA: Insufficient documentation

## 2021-07-05 DIAGNOSIS — C3490 Malignant neoplasm of unspecified part of unspecified bronchus or lung: Secondary | ICD-10-CM | POA: Diagnosis not present

## 2021-07-05 DIAGNOSIS — M21371 Foot drop, right foot: Secondary | ICD-10-CM | POA: Insufficient documentation

## 2021-07-05 DIAGNOSIS — Z79899 Other long term (current) drug therapy: Secondary | ICD-10-CM | POA: Insufficient documentation

## 2021-07-05 DIAGNOSIS — R569 Unspecified convulsions: Secondary | ICD-10-CM | POA: Insufficient documentation

## 2021-07-05 DIAGNOSIS — D6481 Anemia due to antineoplastic chemotherapy: Secondary | ICD-10-CM | POA: Diagnosis not present

## 2021-07-05 DIAGNOSIS — T451X5A Adverse effect of antineoplastic and immunosuppressive drugs, initial encounter: Secondary | ICD-10-CM | POA: Diagnosis not present

## 2021-07-05 DIAGNOSIS — Z923 Personal history of irradiation: Secondary | ICD-10-CM | POA: Insufficient documentation

## 2021-07-05 DIAGNOSIS — Z7952 Long term (current) use of systemic steroids: Secondary | ICD-10-CM | POA: Diagnosis not present

## 2021-07-05 DIAGNOSIS — Z5111 Encounter for antineoplastic chemotherapy: Secondary | ICD-10-CM

## 2021-07-05 LAB — CBC WITH DIFFERENTIAL/PLATELET
Abs Immature Granulocytes: 0.03 10*3/uL (ref 0.00–0.07)
Basophils Absolute: 0 10*3/uL (ref 0.0–0.1)
Basophils Relative: 1 %
Eosinophils Absolute: 0 10*3/uL (ref 0.0–0.5)
Eosinophils Relative: 1 %
HCT: 33.8 % — ABNORMAL LOW (ref 36.0–46.0)
Hemoglobin: 11 g/dL — ABNORMAL LOW (ref 12.0–15.0)
Immature Granulocytes: 1 %
Lymphocytes Relative: 29 %
Lymphs Abs: 1.5 10*3/uL (ref 0.7–4.0)
MCH: 28.6 pg (ref 26.0–34.0)
MCHC: 32.5 g/dL (ref 30.0–36.0)
MCV: 87.8 fL (ref 80.0–100.0)
Monocytes Absolute: 0.4 10*3/uL (ref 0.1–1.0)
Monocytes Relative: 7 %
Neutro Abs: 3.2 10*3/uL (ref 1.7–7.7)
Neutrophils Relative %: 61 %
Platelets: 322 10*3/uL (ref 150–400)
RBC: 3.85 MIL/uL — ABNORMAL LOW (ref 3.87–5.11)
RDW: 18 % — ABNORMAL HIGH (ref 11.5–15.5)
Smear Review: NORMAL
WBC: 5.2 10*3/uL (ref 4.0–10.5)
nRBC: 0 % (ref 0.0–0.2)

## 2021-07-05 LAB — COMPREHENSIVE METABOLIC PANEL
ALT: 27 U/L (ref 0–44)
AST: 17 U/L (ref 15–41)
Albumin: 4 g/dL (ref 3.5–5.0)
Alkaline Phosphatase: 55 U/L (ref 38–126)
Anion gap: 10 (ref 5–15)
BUN: 7 mg/dL (ref 6–20)
CO2: 28 mmol/L (ref 22–32)
Calcium: 9.2 mg/dL (ref 8.9–10.3)
Chloride: 99 mmol/L (ref 98–111)
Creatinine, Ser: 0.43 mg/dL — ABNORMAL LOW (ref 0.44–1.00)
GFR, Estimated: >60 mL/min (ref >60)
Glucose, Bld: 117 mg/dL — ABNORMAL HIGH (ref 70–99)
Potassium: 3.5 mmol/L (ref 3.5–5.1)
Sodium: 137 mmol/L (ref 135–145)
Total Bilirubin: 0.2 mg/dL — ABNORMAL LOW (ref 0.3–1.2)
Total Protein: 7.2 g/dL (ref 6.5–8.1)

## 2021-07-05 LAB — TSH: TSH: 1.806 u[IU]/mL (ref 0.350–4.500)

## 2021-07-09 ENCOUNTER — Encounter: Payer: Self-pay | Admitting: *Deleted

## 2021-07-12 ENCOUNTER — Other Ambulatory Visit: Payer: Self-pay | Admitting: Oncology

## 2021-07-12 NOTE — Telephone Encounter (Signed)
  Component Ref Range & Units 7 d ago 2 wk ago 4 wk ago 1 mo ago 2 mo ago 1 yr ago 2 yr ago  Potassium 3.5 - 5.1 mmol/L 3.5  4.0  3.4 Low   4.0  4.4 CM  4.1 R  4.1 R

## 2021-07-13 ENCOUNTER — Other Ambulatory Visit: Payer: Self-pay | Admitting: Oncology

## 2021-07-16 ENCOUNTER — Inpatient Hospital Stay (HOSPITAL_BASED_OUTPATIENT_CLINIC_OR_DEPARTMENT_OTHER): Payer: BC Managed Care – PPO | Admitting: Oncology

## 2021-07-16 ENCOUNTER — Inpatient Hospital Stay: Payer: BC Managed Care – PPO

## 2021-07-16 ENCOUNTER — Ambulatory Visit: Payer: BC Managed Care – PPO | Admitting: Radiation Oncology

## 2021-07-16 ENCOUNTER — Encounter: Payer: Self-pay | Admitting: Oncology

## 2021-07-16 ENCOUNTER — Other Ambulatory Visit: Payer: Self-pay

## 2021-07-16 VITALS — BP 132/75 | HR 85 | Temp 98.1°F | Resp 18

## 2021-07-16 DIAGNOSIS — C7931 Secondary malignant neoplasm of brain: Secondary | ICD-10-CM

## 2021-07-16 DIAGNOSIS — Z8042 Family history of malignant neoplasm of prostate: Secondary | ICD-10-CM | POA: Diagnosis not present

## 2021-07-16 DIAGNOSIS — T451X5D Adverse effect of antineoplastic and immunosuppressive drugs, subsequent encounter: Secondary | ICD-10-CM | POA: Diagnosis not present

## 2021-07-16 DIAGNOSIS — C3490 Malignant neoplasm of unspecified part of unspecified bronchus or lung: Secondary | ICD-10-CM | POA: Diagnosis not present

## 2021-07-16 DIAGNOSIS — M21371 Foot drop, right foot: Secondary | ICD-10-CM | POA: Diagnosis not present

## 2021-07-16 DIAGNOSIS — D6481 Anemia due to antineoplastic chemotherapy: Secondary | ICD-10-CM | POA: Diagnosis not present

## 2021-07-16 DIAGNOSIS — Z5112 Encounter for antineoplastic immunotherapy: Secondary | ICD-10-CM

## 2021-07-16 DIAGNOSIS — T451X5A Adverse effect of antineoplastic and immunosuppressive drugs, initial encounter: Secondary | ICD-10-CM

## 2021-07-16 DIAGNOSIS — F17210 Nicotine dependence, cigarettes, uncomplicated: Secondary | ICD-10-CM | POA: Diagnosis not present

## 2021-07-16 DIAGNOSIS — Z5111 Encounter for antineoplastic chemotherapy: Secondary | ICD-10-CM

## 2021-07-16 LAB — CBC WITH DIFFERENTIAL/PLATELET
Abs Immature Granulocytes: 0.06 10*3/uL (ref 0.00–0.07)
Basophils Absolute: 0.1 10*3/uL (ref 0.0–0.1)
Basophils Relative: 1 %
Eosinophils Absolute: 0 10*3/uL (ref 0.0–0.5)
Eosinophils Relative: 0 %
HCT: 37.1 % (ref 36.0–46.0)
Hemoglobin: 11.7 g/dL — ABNORMAL LOW (ref 12.0–15.0)
Immature Granulocytes: 1 %
Lymphocytes Relative: 29 %
Lymphs Abs: 2.1 10*3/uL (ref 0.7–4.0)
MCH: 27.2 pg (ref 26.0–34.0)
MCHC: 31.5 g/dL (ref 30.0–36.0)
MCV: 86.3 fL (ref 80.0–100.0)
Monocytes Absolute: 0.4 10*3/uL (ref 0.1–1.0)
Monocytes Relative: 5 %
Neutro Abs: 4.7 10*3/uL (ref 1.7–7.7)
Neutrophils Relative %: 64 %
Platelets: 418 10*3/uL — ABNORMAL HIGH (ref 150–400)
RBC: 4.3 MIL/uL (ref 3.87–5.11)
RDW: 18.3 % — ABNORMAL HIGH (ref 11.5–15.5)
WBC: 7.2 10*3/uL (ref 4.0–10.5)
nRBC: 0 % (ref 0.0–0.2)

## 2021-07-16 LAB — COMPREHENSIVE METABOLIC PANEL
ALT: 19 U/L (ref 0–44)
AST: 19 U/L (ref 15–41)
Albumin: 4.1 g/dL (ref 3.5–5.0)
Alkaline Phosphatase: 73 U/L (ref 38–126)
Anion gap: 10 (ref 5–15)
BUN: 11 mg/dL (ref 6–20)
CO2: 30 mmol/L (ref 22–32)
Calcium: 9.7 mg/dL (ref 8.9–10.3)
Chloride: 96 mmol/L — ABNORMAL LOW (ref 98–111)
Creatinine, Ser: 0.38 mg/dL — ABNORMAL LOW (ref 0.44–1.00)
GFR, Estimated: >60 mL/min (ref >60)
Glucose, Bld: 108 mg/dL — ABNORMAL HIGH (ref 70–99)
Potassium: 4.3 mmol/L (ref 3.5–5.1)
Sodium: 136 mmol/L (ref 135–145)
Total Bilirubin: 0.3 mg/dL (ref 0.3–1.2)
Total Protein: 8 g/dL (ref 6.5–8.1)

## 2021-07-16 MED ORDER — SODIUM CHLORIDE 0.9 % IV SOLN
100.0000 mg/m2 | Freq: Once | INTRAVENOUS | Status: AC
  Administered 2021-07-16: 170 mg via INTRAVENOUS
  Filled 2021-07-16: qty 8.5

## 2021-07-16 MED ORDER — HYDROCORTISONE 0.5 % EX CREA: 1.0000 "application " | TOPICAL_CREAM | Freq: Two times a day (BID) | CUTANEOUS | 0 refills | Status: DC | PRN

## 2021-07-16 MED ORDER — SODIUM CHLORIDE 0.9 % IV SOLN
490.0000 mg | Freq: Once | INTRAVENOUS | Status: AC
  Administered 2021-07-16: 490 mg via INTRAVENOUS
  Filled 2021-07-16: qty 49

## 2021-07-16 MED ORDER — PALONOSETRON HCL INJECTION 0.25 MG/5ML
0.2500 mg | Freq: Once | INTRAVENOUS | Status: AC
  Administered 2021-07-16: 0.25 mg via INTRAVENOUS
  Filled 2021-07-16: qty 5

## 2021-07-16 MED ORDER — SODIUM CHLORIDE 0.9 % IV SOLN
10.0000 mg | Freq: Once | INTRAVENOUS | Status: AC
  Administered 2021-07-16: 10 mg via INTRAVENOUS
  Filled 2021-07-16: qty 10

## 2021-07-16 MED ORDER — SODIUM CHLORIDE 0.9 % IV SOLN
150.0000 mg | Freq: Once | INTRAVENOUS | Status: AC
  Administered 2021-07-16: 150 mg via INTRAVENOUS
  Filled 2021-07-16: qty 150

## 2021-07-16 MED ORDER — HEPARIN SOD (PORK) LOCK FLUSH 100 UNIT/ML IV SOLN
500.0000 [IU] | Freq: Once | INTRAVENOUS | Status: AC | PRN
  Administered 2021-07-16: 500 [IU]
  Filled 2021-07-16: qty 5

## 2021-07-16 MED ORDER — SODIUM CHLORIDE 0.9 % IV SOLN
Freq: Once | INTRAVENOUS | Status: AC
  Filled 2021-07-16: qty 250

## 2021-07-16 MED ORDER — SODIUM CHLORIDE 0.9 % IV SOLN
1200.0000 mg | Freq: Once | INTRAVENOUS | Status: AC
  Administered 2021-07-16: 1200 mg via INTRAVENOUS
  Filled 2021-07-16: qty 20

## 2021-07-16 NOTE — Patient Instructions (Signed)
[facility] ONCOLOG[facility]  Discharge Instructions: Thank you for choosing [facility] to provide your oncology and hematology care.  If you have a lab appointment with the [facility], please go directly to the [facility] and check in at the registration area.  Wear comfortable clothing and clothing appropriate for easy access to any Portacath or PICC line.   We strive to give you quality time with your provider. You may need to reschedule your appointment if you arrive late (15 or more minutes).  Arriving late affects you and other patients whose appointments are after yours.  Also, if you miss three or more appointments without notifying the office, you may be dismissed from the clinic at the provider's discretion.      For prescription refill requests, have your pharmacy contact our office and allow 72 hours for refills to be completed.    Today you received the following chemotherapy and/or immunotherapy agents       To help prevent nausea and vomiting after your treatment, we encourage you to take your nausea medication as directed.  BELOW ARE SYMPTOMS THAT SHOULD BE REPORTED IMMEDIATELY: *FEVER GREATER THAN 100.4 F (38 C) OR HIGHER *CHILLS OR SWEATING *NAUSEA AND VOMITING THAT IS NOT CONTROLLED WITH YOUR NAUSEA MEDICATION *UNUSUAL SHORTNESS OF BREATH *UNUSUAL BRUISING OR BLEEDING *URINARY PROBLEMS (pain or burning when urinating, or frequent urination) *BOWEL PROBLEMS (unusual diarrhea, constipation, pain near the anus) TENDERNESS IN MOUTH AND THROAT WITH OR WITHOUT PRESENCE OF ULCERS (sore throat, sores in mouth, or a toothache) UNUSUAL RASH, SWELLING OR PAIN  UNUSUAL VAGINAL DISCHARGE OR ITCHING   Items with * indicate a potential emergency and should be followed up as soon as possible or go to the Emergency Department if any problems should occur.  Please show the CHEMOTHERAPY ALERT CARD or IMMUNOTHERAPY ALERT CARD at check-in to the  Emergency Department and triage nurse.  Should you have questions after your visit or need to cancel or reschedule your appointment, please contact [facility]  [phone] and follow the prompts.  Office hours are 8:00 a.m. to 4:30 p.m. Monday - Friday. Please note that voicemails left after 4:00 p.m. may not be returned until the following business day.  We are closed weekends and major holidays. You have access to a nurse at all times for urgent questions. Please call the main number to the clinic [phone] and follow the prompts.  For any non-urgent questions, you may also contact your provider using MyChart. We now offer e-Visits for anyone 74 and older to request care online for non-urgent symptoms. For details visit [email or web address].   Also download the MyChart app! Go to the app store, search "MyChart", open the app, select [facility], and log in with your MyChart username and password.  Due to Covid, a mask is required upon entering the hospital/clinic. If you do not have a mask, one will be given to you upon arrival. For doctor visits, patients may have 1 support person aged 74 or older with them. For treatment visits, patients cannot have anyone with them due to current Covid guidelines and our immunocompromised population.

## 2021-07-16 NOTE — Progress Notes (Signed)
Hematology/Oncology progress note  [phone] [phone]   Patient Care Team: Delsa Grana, PA-C as PCP - General (Family Medicine) Telford Nab, RN as Oncology Nurse Navigator  REFERRING PROVIDER: Delsa Grana, PA-C  CHIEF COMPLAINTS/REASON FOR VISIT:  Extensive small cell lung cancer  HISTORY OF PRESENTING ILLNESS:   Jenna Newton is a  55 y.o.  female with PMH listed below was seen in consultation at the request of  Delsa Grana, PA-C  for evaluation of lung mass  05/08/2021, patient presented to emergency room for evaluation of expressive aphasia.  Acute onset of symptoms.  She/has no other neurological deficits at that time.  In ER, patient was found to have expressive aphasia with intermittent word at rest, though receptive and follows commands. MRI brain with and without contrast showed multiple small peripherally enhancing lesions.  Primary differential consideration is metastatic disease.   Patient declined being admitted.  Was eventually convinced to stay to do CT chest abdomen pelvis with contrast. CT showed a 3.2 cm posterior left upper lobe mass extending from the left hilum, concerning for primary bronchogenic carcinoma.  Enlarged left hilar and prominent bilateral peritracheal lymph nodes are identified.  Worrisome for nodal metastasis.  Similar appearance of bilateral adrenal nodule compared with 12/17/2013.  Favor benign adenomas.  Coronary artery calcifications, enlarged fibroid uterus. Patient prefers to be discharged home.  She was sent on dexamethasone 4 mg twice daily.  And Keppra 500 mg twice daily.  Patient was referred to establish care with cancer center. Today she has no difficulty in speech.  She was accompanied by her cousin. She has some gait change with right foot drop. No additional episodes of expressive aphasia.  Patient drinks alcohol, 12 packs of beer /week, current every day smoker.   05/25/2021 left upper lobe biopsy via  bronchoscopy showed small cell lung cancer. left upper lobe brushing/ LUB lavage + small cell lung cancer,  Precarinal lymph node biopsy- not enough tissue  Patient has baseline alopecia 05/30/2021- 06/13/2021 palliative brain RT.  06/04/2021 carboplatin etoposide  INTERVAL HISTORY Jenna Newton is a 55 y.o. female who has above history reviewed by me today presents for follow up visit for management of extensive small cell lung cancer  S/p cycle 1 carboplatin etoposide.  Tolerates well.  Patient has had a Mediport placed  during the interval.  O currently on dexamethasone 2 mg daily.   Patient is also on Keppra 500 mg twice daily.   Hepatitis.  She has gained weight.  Gait problem and speech difficulty have improved.  Review of Systems  Constitutional:  Negative for appetite change, chills, fatigue and fever.  HENT:   Negative for hearing loss and voice change.        Alopecia-chronic  Eyes:  Negative for eye problems.  Respiratory:  Negative for chest tightness and cough.   Cardiovascular:  Negative for chest pain.  Gastrointestinal:  Negative for abdominal distention, abdominal pain and blood in stool.  Endocrine: Negative for hot flashes.  Genitourinary:  Negative for difficulty urinating and frequency.   Musculoskeletal:  Negative for arthralgias and gait problem.  Skin:  Negative for itching and rash.  Neurological:  Negative for extremity weakness, gait problem, headaches, seizures and speech difficulty.  Hematological:  Negative for adenopathy.  Psychiatric/Behavioral:  Negative for confusion.    MEDICAL HISTORY:  Past Medical History:  Diagnosis Date   Anemia    GERD (gastroesophageal reflux disease)    Hypertension    Neuropathy    Tobacco  abuse 03/14/2017   Vertigo    White matter disease of brain due to ischemia 12/04/2017   Head CT March 2019    SURGICAL HISTORY: Past Surgical History:  Procedure Laterality Date   none     PORTA CATH INSERTION N/A  06/18/2021   Procedure: PORTA CATH INSERTION;  Surgeon: Algernon Huxley, MD;  Location: [facility];  Service: Cardiovascular;  Laterality: N/A;   VIDEO BRONCHOSCOPY WITH ENDOBRONCHIAL NAVIGATION N/A 05/25/2021   Procedure: ROBOTIC ASSISTED VIDEO BRONCHOSCOPY WITH ENDOBRONCHIAL NAVIGATION;  Surgeon: Tyler Pita, MD;  Location: [facility];  Service: Pulmonary;  Laterality: N/A;   VIDEO BRONCHOSCOPY WITH ENDOBRONCHIAL ULTRASOUND N/A 05/25/2021   Procedure: VIDEO BRONCHOSCOPY WITH ENDOBRONCHIAL ULTRASOUND;  Surgeon: Tyler Pita, MD;  Location: [facility];  Service: Pulmonary;  Laterality: N/A;    SOCIAL HISTORY: Social History   Socioeconomic History   Marital status: Single    Spouse name: Not on file   Number of children: 0   Years of education: Not on file   Highest education level: Bachelor's degree (e.g., BA, AB, BS)  Occupational History   Not on file  Tobacco Use   Smoking status: Every Day    Packs/day: 1.00    Years: 25.00    Pack years: 25.00    Types: Cigarettes   Smokeless tobacco: Never  Vaping Use   Vaping Use: Never used  Substance and Sexual Activity   Alcohol use: Yes    Alcohol/week: 14.0 standard drinks    Types: 14 Cans of beer per week    Comment: 12 pack/ week   Drug use: No   Sexual activity: Yes    Partners: Male    Birth control/protection: Post-menopausal  Other Topics Concern   Not on file  Social History Narrative   Lives with brother   Social Determinants of Health   Financial Resource Strain: Not on file  Food Insecurity: Food Insecurity Present   Worried About Charity fundraiser in the Last Year: Sometimes true   Arboriculturist in the Last Year: Sometimes true  Transportation Needs: Not on file  Physical Activity: Not on file  Stress: Not on file  Social Connections: Not on file  Intimate Partner Violence: Not on file    FAMILY HISTORY: Family History  Problem Relation Age of Onset   Diabetes Mother    Hypertension  Mother    Emphysema Mother    Emphysema Father    Diabetes Brother    Alcohol abuse Brother    Diabetes Maternal Grandmother    Hypertension Maternal Grandmother    Blindness Maternal Grandmother    Cancer Maternal Grandfather        prostate   Emphysema Maternal Grandfather    Cancer Paternal Grandmother    Emphysema Paternal Grandfather     ALLERGIES:  is allergic to penicillins, meclizine, and sulfa antibiotics.  MEDICATIONS:  Current Outpatient Medications  Medication Sig Dispense Refill   amLODipine (NORVASC) 5 MG tablet Take 1 tablet (5 mg total) by mouth daily. 90 tablet 3   cromolyn (OPTICROM) 4 % ophthalmic solution Place 1 drop into both eyes 4 (four) times daily as needed. 10 mL 3   dexamethasone (DECADRON) 1 MG tablet Take 1 tablet (1 mg total) by mouth See admin instructions. Take 3mg  daily for 1 week and then take 2mg  daily for 1 week. 35 tablet 0   dexamethasone (DECADRON) 1 MG tablet Take 1 tablet (1 mg total) by mouth  daily. 14 tablet 0   fluticasone (FLONASE) 50 MCG/ACT nasal spray Place 1 spray into both nostrils daily as needed for allergies.     KLOR-CON M10 10 MEQ tablet TAKE 1 TABLET BY MOUTH EVERY DAY 30 tablet 0   levETIRAcetam (KEPPRA) 500 MG tablet Take 1 tablet (500 mg total) by mouth 2 (two) times daily. 120 tablet 0   lidocaine-prilocaine (EMLA) cream Apply 1 application topically as needed. 30 g 3   meclizine (ANTIVERT) 25 MG tablet Take 1 tablet (25 mg total) by mouth 3 (three) times daily as needed for dizziness (vertigo). (Patient not taking: No sig reported) 90 tablet 11   montelukast (SINGULAIR) 10 MG tablet Take 1 tablet (10 mg total) by mouth at bedtime. 90 tablet 3   ondansetron (ZOFRAN) 8 MG tablet Take 1 tablet (8 mg total) by mouth 2 (two) times daily as needed for refractory nausea / vomiting. Start on day 3 after carboplatin chemo. (Patient not taking: Reported on 06/25/2021) 30 tablet 1   prochlorperazine (COMPAZINE) 10 MG tablet Take 1  tablet (10 mg total) by mouth every 6 (six) hours as needed (Nausea or vomiting). (Patient not taking: Reported on 06/25/2021) 30 tablet 1   saline (AYR) GEL Place 1 application into the nose every 4 (four) hours. 14.1 g 3   Tiotropium Bromide-Olodaterol (STIOLTO RESPIMAT) 2.5-2.5 MCG/ACT AERS Inhale 2 puffs into the lungs daily. (Patient not taking: Reported on 06/25/2021) 1 each 0   vitamin B-12 (CYANOCOBALAMIN) 500 MCG tablet Take 1 tablet (500 mcg total) by mouth daily. 100 tablet 3   No current facility-administered medications for this visit.     PHYSICAL EXAMINATION: ECOG PERFORMANCE STATUS: 1 - Symptomatic but completely ambulatory There were no vitals filed for this visit.  There were no vitals filed for this visit.    Physical Exam Constitutional:      General: She is not in acute distress. HENT:     Head: Normocephalic and atraumatic.  Eyes:     General: No scleral icterus. Cardiovascular:     Rate and Rhythm: Normal rate and regular rhythm.     Heart sounds: Normal heart sounds.  Pulmonary:     Effort: Pulmonary effort is normal. No respiratory distress.     Breath sounds: No wheezing.  Abdominal:     General: Bowel sounds are normal. There is no distension.     Palpations: Abdomen is soft.  Musculoskeletal:        General: No deformity. Normal range of motion.     Cervical back: Normal range of motion and neck supple.  Skin:    General: Skin is warm and dry.     Findings: No erythema or rash.  Neurological:     Mental Status: She is alert and oriented to person, place, and time. Mental status is at baseline.     Cranial Nerves: No cranial nerve deficit.     Coordination: Coordination normal.     Comments: Gait has improved.   Psychiatric:        Mood and Affect: Mood normal.    LABORATORY DATA:  I have reviewed the data as listed Lab Results  Component Value Date   WBC 5.2 07/05/2021   HGB 11.0 (L) 07/05/2021   HCT 33.8 (L) 07/05/2021   MCV 87.8  07/05/2021   PLT 322 07/05/2021   Recent Labs    06/14/21 0948 06/25/21 0858 07/05/21 1104  NA 135 135 137  K 3.4* 4.0 3.5  CL 106  101 99  CO2 21* 29 28  GLUCOSE 108* 108* 117*  BUN 7 14 7   CREATININE 0.34* <0.30* 0.43*  CALCIUM 8.2* 9.4 9.2  GFRNONAA >60 NOT CALCULATED >60  PROT 6.5 7.3 7.2  ALBUMIN 3.8 4.0 4.0  AST 19 20 17   ALT 22 30 27   ALKPHOS 53 52 55  BILITOT <0.1* 0.3 0.2*    Iron/TIBC/Ferritin/ %Sat    Component Value Date/Time   FERRITIN 126 03/14/2017 0926       RADIOGRAPHIC STUDIES: I have personally reviewed the radiological images as listed and agreed with the findings in the report. PERIPHERAL VASCULAR CATHETERIZATION  Result Date: 06/18/2021 See surgical note for result.     ASSESSMENT & PLAN:  1. Small cell lung cancer ([place])   2. Encounter for antineoplastic chemotherapy   3. Encounter for antineoplastic immunotherapy   4. Antineoplastic chemotherapy induced anemia   Cancer Staging Small cell lung cancer [facility]) Staging form: Lung, AJCC 8th Edition - Clinical stage from 06/01/2021: Stage IV (cT2a, cN3, cM1) - Signed by Earlie Server, MD on 06/01/2021  # Extensive small cell cancer, brain metastatic disease S/p 1 cycle of carboplatin + etoposide Finished RT. She tolerates well. Labs reviewed and discussed with patient Proceed with cycle 2 carboplatin/etoposide.  Add Tecentriq.  Rationale and potential side effects were discussed with patient.  # Brain metastasis and seizure S/p RT, steroid taper. Keppra 500mg  BID.  Prescription was sent to pharmacy. Continue Dexamethasone 2mg  daily, followed by 1mg  daily x 1 week, followed by 1 mg every other day x1 week and then stop.  # Anemia, chemotherapy induced.   Supportive care measures are necessary for patient well-being and will be provided as necessary. We spent sufficient time to discuss many aspect of care, questions were answered to patient's satisfaction.  Follow up 3 weeks lab MD  carboplatin/Etoposide/tecentriq    All questions were answered. The patient knows to call the clinic with any problems questions or concerns.  cc Delsa Grana, PA-C     Earlie Server, MD, PhD 07/16/2021

## 2021-07-16 NOTE — Progress Notes (Signed)
Pt here for follow up. No new concerns voiced.   

## 2021-07-16 NOTE — Progress Notes (Signed)
Hematology/Oncology progress note [phone] [phone]   Patient Care Team: Delsa Grana, PA-C as PCP - General (Family Medicine) Telford Nab, RN as Oncology Nurse Navigator  REFERRING PROVIDER: Delsa Grana, PA-C  CHIEF COMPLAINTS/REASON FOR VISIT:  small cell lung cancer  HISTORY OF PRESENTING ILLNESS:   Jenna Newton is a  55 y.o.  female with PMH listed below was seen in consultation at the request of  Delsa Grana, PA-C  for evaluation of lung mass  05/08/2021, patient presented to emergency room for evaluation of expressive aphasia.  Acute onset of symptoms.  She/has no other neurological deficits at that time.  In ER, patient was found to have expressive aphasia with intermittent word at rest, though receptive and follows commands. MRI brain with and without contrast showed multiple small peripherally enhancing lesions.  Primary differential consideration is metastatic disease.   Patient declined being admitted.  Was eventually convinced to stay to do CT chest abdomen pelvis with contrast. CT showed a 3.2 cm posterior left upper lobe mass extending from the left hilum, concerning for primary bronchogenic carcinoma.  Enlarged left hilar and prominent bilateral peritracheal lymph nodes are identified.  Worrisome for nodal metastasis.  Similar appearance of bilateral adrenal nodule compared with 12/17/2013.  Favor benign adenomas.  Coronary artery calcifications, enlarged fibroid uterus. Patient prefers to be discharged home.  She was sent on dexamethasone 4 mg twice daily.  And Keppra 500 mg twice daily.  Patient was referred to establish care with cancer center. Today she has no difficulty in speech.  She was accompanied by her cousin. She has some gait change with right foot drop. No additional episodes of expressive aphasia.  Patient drinks alcohol, 12 packs of beer /week, current every day smoker.   05/25/2021 left upper lobe biopsy via bronchoscopy  showed small cell lung cancer. left upper lobe brushing/ LUB lavage + small cell lung cancer,  Precarinal lymph node biopsy- not enough tissue  Patient has baseline alopecia 05/30/2021- 06/13/2021 palliative brain RT.  06/04/2021 cycle 1 carboplatin etoposide 06/25/2021, cycle 2 carboplatin etoposide and Tecentriq.  INTERVAL HISTORY Jenna Newton is a 55 y.o. female who has above history reviewed by me today presents for follow up visit for management of extensive small cell lung cancer  S/p cycle 2 carboplatin etoposide.  Overall she tolerates well Denies any additional seizure episodes.  Patient is on Keppra 500 mg twice daily. Gait problem and speech difficulty have improved.  Review of Systems  Constitutional:  Negative for appetite change, chills, fatigue and fever.  HENT:   Negative for hearing loss and voice change.        Alopecia-chronic  Eyes:  Negative for eye problems.  Respiratory:  Negative for chest tightness and cough.   Cardiovascular:  Negative for chest pain.  Gastrointestinal:  Negative for abdominal distention, abdominal pain and blood in stool.  Endocrine: Negative for hot flashes.  Genitourinary:  Negative for difficulty urinating and frequency.   Musculoskeletal:  Negative for arthralgias and gait problem.  Skin:  Negative for itching and rash.  Neurological:  Negative for extremity weakness, gait problem, headaches, seizures and speech difficulty.  Hematological:  Negative for adenopathy.  Psychiatric/Behavioral:  Negative for confusion.    MEDICAL HISTORY:  Past Medical History:  Diagnosis Date   Anemia    GERD (gastroesophageal reflux disease)    Hypertension    Neuropathy    Tobacco abuse 03/14/2017   Vertigo    White matter disease of brain due to ischemia  12/04/2017   Head CT March 2019    SURGICAL HISTORY: Past Surgical History:  Procedure Laterality Date   none     PORTA CATH INSERTION N/A 06/18/2021   Procedure: PORTA CATH INSERTION;   Surgeon: Algernon Huxley, MD;  Location: [facility];  Service: Cardiovascular;  Laterality: N/A;   VIDEO BRONCHOSCOPY WITH ENDOBRONCHIAL NAVIGATION N/A 05/25/2021   Procedure: ROBOTIC ASSISTED VIDEO BRONCHOSCOPY WITH ENDOBRONCHIAL NAVIGATION;  Surgeon: Tyler Pita, MD;  Location: [facility];  Service: Pulmonary;  Laterality: N/A;   VIDEO BRONCHOSCOPY WITH ENDOBRONCHIAL ULTRASOUND N/A 05/25/2021   Procedure: VIDEO BRONCHOSCOPY WITH ENDOBRONCHIAL ULTRASOUND;  Surgeon: Tyler Pita, MD;  Location: [facility];  Service: Pulmonary;  Laterality: N/A;    SOCIAL HISTORY: Social History   Socioeconomic History   Marital status: Single    Spouse name: Not on file   Number of children: 0   Years of education: Not on file   Highest education level: Bachelor's degree (e.g., BA, AB, BS)  Occupational History   Not on file  Tobacco Use   Smoking status: Every Day    Packs/day: 1.00    Years: 25.00    Pack years: 25.00    Types: Cigarettes   Smokeless tobacco: Never  Vaping Use   Vaping Use: Never used  Substance and Sexual Activity   Alcohol use: Yes    Alcohol/week: 14.0 standard drinks    Types: 14 Cans of beer per week    Comment: 12 pack/ week   Drug use: No   Sexual activity: Yes    Partners: Male    Birth control/protection: Post-menopausal  Other Topics Concern   Not on file  Social History Narrative   Lives with brother   Social Determinants of Health   Financial Resource Strain: Not on file  Food Insecurity: Food Insecurity Present   Worried About Charity fundraiser in the Last Year: Sometimes true   Arboriculturist in the Last Year: Sometimes true  Transportation Needs: Not on file  Physical Activity: Not on file  Stress: Not on file  Social Connections: Not on file  Intimate Partner Violence: Not on file    FAMILY HISTORY: Family History  Problem Relation Age of Onset   Diabetes Mother    Hypertension Mother    Emphysema Mother    Emphysema  Father    Diabetes Brother    Alcohol abuse Brother    Diabetes Maternal Grandmother    Hypertension Maternal Grandmother    Blindness Maternal Grandmother    Cancer Maternal Grandfather        prostate   Emphysema Maternal Grandfather    Cancer Paternal Grandmother    Emphysema Paternal Grandfather     ALLERGIES:  is allergic to penicillins, meclizine, and sulfa antibiotics.  MEDICATIONS:  Current Outpatient Medications  Medication Sig Dispense Refill   amLODipine (NORVASC) 5 MG tablet Take 1 tablet (5 mg total) by mouth daily. 90 tablet 3   cromolyn (OPTICROM) 4 % ophthalmic solution Place 1 drop into both eyes 4 (four) times daily as needed. 10 mL 3   dexamethasone (DECADRON) 1 MG tablet Take 1 tablet (1 mg total) by mouth See admin instructions. Take 3mg  daily for 1 week and then take 2mg  daily for 1 week. 35 tablet 0   dexamethasone (DECADRON) 1 MG tablet Take 1 tablet (1 mg total) by mouth daily. 14 tablet 0   fluticasone (FLONASE) 50 MCG/ACT nasal spray Place 1 spray into  both nostrils daily as needed for allergies.     hydrocortisone cream 0.5 % Apply 1 application topically 2 (two) times daily as needed for itching. 30 g 0   KLOR-CON M10 10 MEQ tablet TAKE 1 TABLET BY MOUTH EVERY DAY 30 tablet 0   levETIRAcetam (KEPPRA) 500 MG tablet Take 1 tablet (500 mg total) by mouth 2 (two) times daily. 120 tablet 0   lidocaine-prilocaine (EMLA) cream Apply 1 application topically as needed. 30 g 3   montelukast (SINGULAIR) 10 MG tablet Take 1 tablet (10 mg total) by mouth at bedtime. 90 tablet 3   saline (AYR) GEL Place 1 application into the nose every 4 (four) hours. 14.1 g 3   Tiotropium Bromide-Olodaterol (STIOLTO RESPIMAT) 2.5-2.5 MCG/ACT AERS Inhale 2 puffs into the lungs daily. 1 each 0   vitamin B-12 (CYANOCOBALAMIN) 500 MCG tablet Take 1 tablet (500 mcg total) by mouth daily. 100 tablet 3   ondansetron (ZOFRAN) 8 MG tablet Take 1 tablet (8 mg total) by mouth 2 (two) times daily  as needed for refractory nausea / vomiting. Start on day 3 after carboplatin chemo. (Patient not taking: No sig reported) 30 tablet 1   prochlorperazine (COMPAZINE) 10 MG tablet Take 1 tablet (10 mg total) by mouth every 6 (six) hours as needed (Nausea or vomiting). (Patient not taking: No sig reported) 30 tablet 1   No current facility-administered medications for this visit.     PHYSICAL EXAMINATION: ECOG PERFORMANCE STATUS: 1 - Symptomatic but completely ambulatory Vitals:   07/16/21 0835  BP: 132/75  Pulse: 85  Resp: 18  Temp: 98.1 F (36.7 C)  SpO2: 97%   There were no vitals filed for this visit.    Physical Exam Constitutional:      General: She is not in acute distress. HENT:     Head: Normocephalic and atraumatic.  Eyes:     General: No scleral icterus. Cardiovascular:     Rate and Rhythm: Normal rate and regular rhythm.     Heart sounds: Normal heart sounds.  Pulmonary:     Effort: Pulmonary effort is normal. No respiratory distress.     Breath sounds: No wheezing.  Abdominal:     General: Bowel sounds are normal. There is no distension.     Palpations: Abdomen is soft.  Musculoskeletal:        General: No deformity. Normal range of motion.     Cervical back: Normal range of motion and neck supple.  Skin:    General: Skin is warm and dry.     Findings: No erythema or rash.  Neurological:     Mental Status: She is alert and oriented to person, place, and time. Mental status is at baseline.     Cranial Nerves: No cranial nerve deficit.     Coordination: Coordination normal.     Comments: Gait has improved.   Psychiatric:        Mood and Affect: Mood normal.    LABORATORY DATA:  I have reviewed the data as listed Lab Results  Component Value Date   WBC 7.2 07/16/2021   HGB 11.7 (L) 07/16/2021   HCT 37.1 07/16/2021   MCV 86.3 07/16/2021   PLT 418 (H) 07/16/2021   Recent Labs    06/25/21 0858 07/05/21 1104 07/16/21 0816  NA 135 137 136  K 4.0  3.5 4.3  CL 101 99 96*  CO2 29 28 30   GLUCOSE 108* 117* 108*  BUN 14 7 11  CREATININE <0.30* 0.43* 0.38*  CALCIUM 9.4 9.2 9.7  GFRNONAA NOT CALCULATED >60 >60  PROT 7.3 7.2 8.0  ALBUMIN 4.0 4.0 4.1  AST 20 17 19   ALT 30 27 19   ALKPHOS 52 55 73  BILITOT 0.3 0.2* 0.3    Iron/TIBC/Ferritin/ %Sat    Component Value Date/Time   FERRITIN 126 03/14/2017 0926       RADIOGRAPHIC STUDIES: I have personally reviewed the radiological images as listed and agreed with the findings in the report. PERIPHERAL VASCULAR CATHETERIZATION  Result Date: 06/18/2021 See surgical note for result.     ASSESSMENT & PLAN:  1. Small cell lung cancer ([place])   2. Encounter for antineoplastic chemotherapy   3. Encounter for antineoplastic immunotherapy   4. Antineoplastic chemotherapy induced anemia   Cancer Staging Small cell lung cancer [facility]) Staging form: Lung, AJCC 8th Edition - Clinical stage from 06/01/2021: Stage IV (cT2a, cN3, cM1) - Signed by Earlie Server, MD on 06/01/2021  # Extensive small cell cancer, brain metastatic disease S/p 1 cycle of carboplatin + etoposide Finished RT. She tolerates well. Labs reviewed and discussed with patient. Proceed with cycle 3 carboplatin/etoposide/Tecentriq[ added since cycle 2] Plan to repeat imaging after 4 cycles of treatment.  # Brain metastasis and seizure S/p RT, steroid taper. Keppra 500mg  BID.  Prescription was sent to pharmacy. Continue Dexamethasone tapering course.   # Anemia, chemotherapy induced. Mild.   Follow up 3 weeks lab MD carboplatin/Etoposide/tecentriq    All questions were answered. The patient knows to call the clinic with any problems questions or concerns.  cc Delsa Grana, PA-C     Earlie Server, MD, PhD  07/16/2021

## 2021-07-17 ENCOUNTER — Inpatient Hospital Stay: Payer: BC Managed Care – PPO

## 2021-07-17 VITALS — BP 121/72 | HR 82 | Temp 96.9°F | Resp 18

## 2021-07-17 DIAGNOSIS — Z95828 Presence of other vascular implants and grafts: Secondary | ICD-10-CM

## 2021-07-17 DIAGNOSIS — C3490 Malignant neoplasm of unspecified part of unspecified bronchus or lung: Secondary | ICD-10-CM

## 2021-07-17 DIAGNOSIS — C7931 Secondary malignant neoplasm of brain: Secondary | ICD-10-CM | POA: Diagnosis not present

## 2021-07-17 DIAGNOSIS — D6481 Anemia due to antineoplastic chemotherapy: Secondary | ICD-10-CM | POA: Diagnosis not present

## 2021-07-17 DIAGNOSIS — M21371 Foot drop, right foot: Secondary | ICD-10-CM | POA: Diagnosis not present

## 2021-07-17 DIAGNOSIS — Z5112 Encounter for antineoplastic immunotherapy: Secondary | ICD-10-CM | POA: Diagnosis not present

## 2021-07-17 DIAGNOSIS — F17210 Nicotine dependence, cigarettes, uncomplicated: Secondary | ICD-10-CM | POA: Diagnosis not present

## 2021-07-17 DIAGNOSIS — Z8042 Family history of malignant neoplasm of prostate: Secondary | ICD-10-CM | POA: Diagnosis not present

## 2021-07-17 DIAGNOSIS — T451X5A Adverse effect of antineoplastic and immunosuppressive drugs, initial encounter: Secondary | ICD-10-CM | POA: Diagnosis not present

## 2021-07-17 MED ORDER — SODIUM CHLORIDE 0.9 % IV SOLN
100.0000 mg/m2 | Freq: Once | INTRAVENOUS | Status: AC
  Administered 2021-07-17: 170 mg via INTRAVENOUS
  Filled 2021-07-17: qty 8.5

## 2021-07-17 MED ORDER — SODIUM CHLORIDE 0.9% FLUSH
10.0000 mL | Freq: Once | INTRAVENOUS | Status: AC
  Administered 2021-07-17: 10 mL via INTRAVENOUS
  Filled 2021-07-17: qty 10

## 2021-07-17 MED ORDER — SODIUM CHLORIDE 0.9 % IV SOLN
10.0000 mg | Freq: Once | INTRAVENOUS | Status: AC
  Administered 2021-07-17: 10 mg via INTRAVENOUS
  Filled 2021-07-17: qty 1

## 2021-07-17 MED ORDER — HEPARIN SOD (PORK) LOCK FLUSH 100 UNIT/ML IV SOLN
500.0000 [IU] | Freq: Once | INTRAVENOUS | Status: AC
  Administered 2021-07-17: 500 [IU] via INTRAVENOUS
  Filled 2021-07-17: qty 5

## 2021-07-17 NOTE — Patient Instructions (Signed)
[facility]  Discharge Instructions: Thank you for choosing [facility] to provide your oncology and hematology care.  If you have a lab appointment with the [facility], please go directly to the [facility] and check in at the registration area.  Wear comfortable clothing and clothing appropriate for easy access to any Portacath or PICC line.   We strive to give you quality time with your provider. You may need to reschedule your appointment if you arrive late (15 or more minutes).  Arriving late affects you and other patients whose appointments are after yours.  Also, if you miss three or more appointments without notifying the office, you may be dismissed from the clinic at the provider's discretion.      For prescription refill requests, have your pharmacy contact our office and allow 72 hours for refills to be completed.    Today you received the following chemotherapy and/or immunotherapy agents  ETOPOSIDE     To help prevent nausea and vomiting after your treatment, we encourage you to take your nausea medication as directed.  BELOW ARE SYMPTOMS THAT SHOULD BE REPORTED IMMEDIATELY: *FEVER GREATER THAN 100.4 F (38 C) OR HIGHER *CHILLS OR SWEATING *NAUSEA AND VOMITING THAT IS NOT CONTROLLED WITH YOUR NAUSEA MEDICATION *UNUSUAL SHORTNESS OF BREATH *UNUSUAL BRUISING OR BLEEDING *URINARY PROBLEMS (pain or burning when urinating, or frequent urination) *BOWEL PROBLEMS (unusual diarrhea, constipation, pain near the anus) TENDERNESS IN MOUTH AND THROAT WITH OR WITHOUT PRESENCE OF ULCERS (sore throat, sores in mouth, or a toothache) UNUSUAL RASH, SWELLING OR PAIN  UNUSUAL VAGINAL DISCHARGE OR ITCHING   Items with * indicate a potential emergency and should be followed up as soon as possible or go to the Emergency Department if any problems should occur.  Please show the CHEMOTHERAPY ALERT CARD or IMMUNOTHERAPY ALERT CARD at check-in  to the Emergency Department and triage nurse.  Should you have questions after your visit or need to cancel or reschedule your appointment, please contact [facility]  [phone] and follow the prompts.  Office hours are 8:00 a.m. to 4:30 p.m. Monday - Friday. Please note that voicemails left after 4:00 p.m. may not be returned until the following business day.  We are closed weekends and major holidays. You have access to a nurse at all times for urgent questions. Please call the main number to the clinic [phone] and follow the prompts.  For any non-urgent questions, you may also contact your provider using MyChart. We now offer e-Visits for anyone 87 and older to request care online for non-urgent symptoms. For details visit [email or web address].   Also download the MyChart app! Go to the app store, search "MyChart", open the app, select [facility], and log in with your MyChart username and password.  Due to Covid, a mask is required upon entering the hospital/clinic. If you do not have a mask, one will be given to you upon arrival. For doctor visits, patients may have 1 support person aged 94 or older with them. For treatment visits, patients cannot have anyone with them due to current Covid guidelines and our immunocompromised  Etoposide, VP-16 injection What is this medication? ETOPOSIDE, VP-16 (e toe POE side) is a chemotherapy drug. It is used to treat testicular cancer, lung cancer, and other cancers. This medicine may be used for other purposes; ask your health care provider or pharmacist if you have questions. COMMON BRAND NAME(S): Etopophos, Toposar, VePesid What should I  tell my care team before I take this medication? They need to know if you have any of these conditions: infection kidney disease liver disease low blood counts, like low white cell, platelet, or red cell counts an unusual or allergic reaction to etoposide, other  medicines, foods, dyes, or preservatives pregnant or trying to get pregnant breast-feeding How should I use this medication? This medicine is for infusion into a vein. It is administered in a hospital or clinic by a specially trained health care professional. Talk to your pediatrician regarding the use of this medicine in children. Special care may be needed. Overdosage: If you think you have taken too much of this medicine contact a poison control center or emergency room at once. NOTE: This medicine is only for you. Do not share this medicine with others. What if I miss a dose? It is important not to miss your dose. Call your doctor or health care professional if you are unable to keep an appointment. What may interact with this medication? This medicine may interact with the following medications: warfarin This list may not describe all possible interactions. Give your health care provider a list of all the medicines, herbs, non-prescription drugs, or dietary supplements you use. Also tell them if you smoke, drink alcohol, or use illegal drugs. Some items may interact with your medicine. What should I watch for while using this medication? Visit your doctor for checks on your progress. This drug may make you feel generally unwell. This is not uncommon, as chemotherapy can affect healthy cells as well as cancer cells. Report any side effects. Continue your course of treatment even though you feel ill unless your doctor tells you to stop. In some cases, you may be given additional medicines to help with side effects. Follow all directions for their use. Call your doctor or health care professional for advice if you get a fever, chills or sore throat, or other symptoms of a cold or flu. Do not treat yourself. This drug decreases your body's ability to fight infections. Try to avoid being around people who are sick. This medicine may increase your risk to bruise or bleed. Call your doctor or health  care professional if you notice any unusual bleeding. Talk to your doctor about your risk of cancer. You may be more at risk for certain types of cancers if you take this medicine. Do not become pregnant while taking this medicine or for at least 6 months after stopping it. Women should inform their doctor if they wish to become pregnant or think they might be pregnant. Women of child-bearing potential will need to have a negative pregnancy test before starting this medicine. There is a potential for serious side effects to an unborn child. Talk to your health care professional or pharmacist for more information. Do not breast-feed an infant while taking this medicine. Men must use a latex condom during sexual contact with a woman while taking this medicine and for at least 4 months after stopping it. A latex condom is needed even if you have had a vasectomy. Contact your doctor right away if your partner becomes pregnant. Do not donate sperm while taking this medicine and for at least 4 months after you stop taking this medicine. Men should inform their doctors if they wish to father a child. This medicine may lower sperm counts. What side effects may I notice from receiving this medication? Side effects that you should report to your doctor or health care professional as soon  as possible: allergic reactions like skin rash, itching or hives, swelling of the face, lips, or tongue low blood counts - this medicine may decrease the number of white blood cells, red blood cells, and platelets. You may be at increased risk for infections and bleeding nausea, vomiting redness, blistering, peeling or loosening of the skin, including inside the mouth signs and symptoms of infection like fever; chills; cough; sore throat; pain or trouble passing urine signs and symptoms of low red blood cells or anemia such as unusually weak or tired; feeling faint or lightheaded; falls; breathing problems unusual bruising or  bleeding Side effects that usually do not require medical attention (report to your doctor or health care professional if they continue or are bothersome): changes in taste diarrhea hair loss loss of appetite mouth sores This list may not describe all possible side effects. Call your doctor for medical advice about side effects. You may report side effects to FDA at 1-800-FDA-1088. Where should I keep my medication? This drug is given in a hospital or clinic and will not be stored at home. NOTE: This sheet is a summary. It may not cover all possible information. If you have questions about this medicine, talk to your doctor, pharmacist, or health care provider.  2022 Elsevier/Gold Standard (2021-05-08 00:00:00) population.

## 2021-07-18 ENCOUNTER — Encounter: Payer: Self-pay | Admitting: Oncology

## 2021-07-18 ENCOUNTER — Inpatient Hospital Stay: Payer: BC Managed Care – PPO

## 2021-07-18 ENCOUNTER — Encounter: Payer: Self-pay | Admitting: Radiation Oncology

## 2021-07-18 ENCOUNTER — Ambulatory Visit
Admission: RE | Admit: 2021-07-18 | Discharge: 2021-07-18 | Disposition: A | Discharge status: Home / Self Care | Payer: BC Managed Care – PPO | Source: Ambulatory Visit | Attending: Radiation Oncology | Admitting: Radiation Oncology

## 2021-07-18 ENCOUNTER — Other Ambulatory Visit: Payer: Self-pay

## 2021-07-18 VITALS — BP 119/72 | HR 74 | Temp 97.2°F | Resp 16 | Wt 146.6 lb

## 2021-07-18 VITALS — BP 106/66 | HR 74 | Temp 96.2°F | Resp 16

## 2021-07-18 DIAGNOSIS — C3490 Malignant neoplasm of unspecified part of unspecified bronchus or lung: Secondary | ICD-10-CM | POA: Insufficient documentation

## 2021-07-18 DIAGNOSIS — C7931 Secondary malignant neoplasm of brain: Secondary | ICD-10-CM

## 2021-07-18 DIAGNOSIS — Z5111 Encounter for antineoplastic chemotherapy: Secondary | ICD-10-CM | POA: Diagnosis not present

## 2021-07-18 DIAGNOSIS — Z923 Personal history of irradiation: Secondary | ICD-10-CM | POA: Insufficient documentation

## 2021-07-18 MED ORDER — SODIUM CHLORIDE 0.9 % IV SOLN
Freq: Once | INTRAVENOUS | Status: AC
  Filled 2021-07-18: qty 250

## 2021-07-18 MED ORDER — HEPARIN SOD (PORK) LOCK FLUSH 100 UNIT/ML IV SOLN
500.0000 [IU] | Freq: Once | INTRAVENOUS | Status: AC | PRN
  Filled 2021-07-18: qty 5

## 2021-07-18 MED ORDER — SODIUM CHLORIDE 0.9 % IV SOLN
10.0000 mg | Freq: Once | INTRAVENOUS | Status: AC
  Administered 2021-07-18: 10 mg via INTRAVENOUS
  Filled 2021-07-18: qty 10

## 2021-07-18 MED ORDER — HEPARIN SOD (PORK) LOCK FLUSH 100 UNIT/ML IV SOLN
INTRAVENOUS | Status: AC
  Administered 2021-07-18: 500 [IU]
  Filled 2021-07-18: qty 5

## 2021-07-18 MED ORDER — SODIUM CHLORIDE 0.9 % IV SOLN
100.0000 mg/m2 | Freq: Once | INTRAVENOUS | Status: AC
  Administered 2021-07-18: 170 mg via INTRAVENOUS
  Filled 2021-07-18: qty 8.5

## 2021-07-18 NOTE — Patient Instructions (Signed)
[facility]  Discharge Instructions: Thank you for choosing [facility] to provide your oncology and hematology care.  If you have a lab appointment with the [facility], please go directly to the [facility] and check in at the registration area.  Wear comfortable clothing and clothing appropriate for easy access to any Portacath or PICC line.   We strive to give you quality time with your provider. You may need to reschedule your appointment if you arrive late (15 or more minutes).  Arriving late affects you and other patients whose appointments are after yours.  Also, if you miss three or more appointments without notifying the office, you may be dismissed from the clinic at the provider's discretion.      For prescription refill requests, have your pharmacy contact our office and allow 72 hours for refills to be completed.    Today you received the following chemotherapy and/or immunotherapy agents : etoposide      To help prevent nausea and vomiting after your treatment, we encourage you to take your nausea medication as directed.  BELOW ARE SYMPTOMS THAT SHOULD BE REPORTED IMMEDIATELY: *FEVER GREATER THAN 100.4 F (38 C) OR HIGHER *CHILLS OR SWEATING *NAUSEA AND VOMITING THAT IS NOT CONTROLLED WITH YOUR NAUSEA MEDICATION *UNUSUAL SHORTNESS OF BREATH *UNUSUAL BRUISING OR BLEEDING *URINARY PROBLEMS (pain or burning when urinating, or frequent urination) *BOWEL PROBLEMS (unusual diarrhea, constipation, pain near the anus) TENDERNESS IN MOUTH AND THROAT WITH OR WITHOUT PRESENCE OF ULCERS (sore throat, sores in mouth, or a toothache) UNUSUAL RASH, SWELLING OR PAIN  UNUSUAL VAGINAL DISCHARGE OR ITCHING   Items with * indicate a potential emergency and should be followed up as soon as possible or go to the Emergency Department if any problems should occur.  Please show the CHEMOTHERAPY ALERT CARD or IMMUNOTHERAPY ALERT CARD at check-in  to the Emergency Department and triage nurse.  Should you have questions after your visit or need to cancel or reschedule your appointment, please contact [facility]  [phone] and follow the prompts.  Office hours are 8:00 a.m. to 4:30 p.m. Monday - Friday. Please note that voicemails left after 4:00 p.m. may not be returned until the following business day.  We are closed weekends and major holidays. You have access to a nurse at all times for urgent questions. Please call the main number to the clinic [phone] and follow the prompts.  For any non-urgent questions, you may also contact your provider using MyChart. We now offer e-Visits for anyone 44 and older to request care online for non-urgent symptoms. For details visit [email or web address].   Also download the MyChart app! Go to the app store, search "MyChart", open the app, select [facility], and log in with your MyChart username and password.  Due to Covid, a mask is required upon entering the hospital/clinic. If you do not have a mask, one will be given to you upon arrival. For doctor visits, patients may have 1 support person aged 69 or older with them. For treatment visits, patients cannot have anyone with them due to current Covid guidelines and our immunocompromised population.

## 2021-07-18 NOTE — Progress Notes (Signed)
Nutrition Follow-up:  Patient with extensive lung cancer.  Patient receiving carboplatin, etoposide, tecentriq.    Met with patient in infusion.  Patient eating a payday bar.  Reports that appetite is good. "I eat all the time." Denies any nutrition impact symptoms.     Medications: reviewed  Labs: reviewed  Anthropometrics:   Weight is 146 lb 9.6 oz today  142 lb 3.2 oz on 10/26 138 lb on 10/17 124 lb on 9/23   NUTRITION DIAGNOSIS: Food and nutrition related knowledge deficit improved   INTERVENTION:  Patient to continue eating well balanced diet including good sources of protein.       NEXT VISIT: as needed   B. , RD, LDN Registered Dietitian [phone] (mobile)   

## 2021-07-18 NOTE — Progress Notes (Signed)
Radiation Oncology Follow up Note  Name: Jenna Newton   Date:   07/18/2021 MRN:  [id number] DOB: 1966/07/24    This 55 y.o. female presents to the clinic today for 1 month follow-up status post whole brain radiation therapy inpatient with known extensive stage small cell lung cancer.  REFERRING PROVIDER: Delsa Grana, PA-C  HPI: Patient is a 55 year old female now at 1 month having completed whole brain radiation therapy for extensive stage small cell lung cancer.  Seen today in routine follow-up she is doing well she specifically denies headaches cough any change in neurologic status or any focal neurologic deficits..  She is currently receiving carboplatin and etoposide under medical oncology's direction.  COMPLICATIONS OF TREATMENT: none  FOLLOW UP COMPLIANCE: keeps appointments   PHYSICAL EXAM:  BP 119/72 (BP Location: Left Arm, Patient Position: Sitting)   Pulse 74   Temp (!) 97.2 F (36.2 C)   Resp 16   Wt 146 lb 9.6 oz (66.5 kg)   LMP 01/23/2015   BMI 23.66 kg/m  Well-developed well-nourished patient in NAD. HEENT reveals PERLA, EOMI, discs not visualized.  Oral cavity is clear. No oral mucosal lesions are identified. Neck is clear without evidence of cervical or supraclavicular adenopathy. Lungs are clear to A&P. Cardiac examination is essentially unremarkable with regular rate and rhythm without murmur rub or thrill. Abdomen is benign with no organomegaly or masses noted. Motor sensory and DTR levels are equal and symmetric in the upper and lower extremities. Cranial nerves II through XII are grossly intact. Proprioception is intact. No peripheral adenopathy or edema is identified. No motor or sensory levels are noted. Crude visual fields are within normal range.  RADIOLOGY RESULTS: No current films for review  PLAN: Present time she continues systemic treatment with medical oncology.  She has done well with very low side effect profile from whole brain radiation.  I will  turn follow-up care over to medical oncology.  I would be happy to reevaluate the patient anytime should further palliative treatment be indicated.  I would like to take this opportunity to thank you for allowing me to participate in the care of your patient.Noreene Filbert, MD

## 2021-07-25 ENCOUNTER — Ambulatory Visit: Payer: BC Managed Care – PPO | Admitting: Radiation Oncology

## 2021-08-05 ENCOUNTER — Other Ambulatory Visit: Payer: Self-pay | Admitting: Oncology

## 2021-08-06 ENCOUNTER — Inpatient Hospital Stay (HOSPITAL_BASED_OUTPATIENT_CLINIC_OR_DEPARTMENT_OTHER): Payer: BC Managed Care – PPO | Admitting: Oncology

## 2021-08-06 ENCOUNTER — Inpatient Hospital Stay: Payer: BC Managed Care – PPO | Attending: Oncology

## 2021-08-06 ENCOUNTER — Inpatient Hospital Stay: Payer: BC Managed Care – PPO

## 2021-08-06 ENCOUNTER — Other Ambulatory Visit: Payer: Self-pay

## 2021-08-06 ENCOUNTER — Encounter: Payer: Self-pay | Admitting: Oncology

## 2021-08-06 VITALS — BP 110/73 | HR 93 | Temp 97.9°F | Wt 144.0 lb

## 2021-08-06 DIAGNOSIS — F17210 Nicotine dependence, cigarettes, uncomplicated: Secondary | ICD-10-CM | POA: Insufficient documentation

## 2021-08-06 DIAGNOSIS — Z8042 Family history of malignant neoplasm of prostate: Secondary | ICD-10-CM | POA: Insufficient documentation

## 2021-08-06 DIAGNOSIS — Z9221 Personal history of antineoplastic chemotherapy: Secondary | ICD-10-CM | POA: Diagnosis not present

## 2021-08-06 DIAGNOSIS — T451X5A Adverse effect of antineoplastic and immunosuppressive drugs, initial encounter: Secondary | ICD-10-CM | POA: Diagnosis not present

## 2021-08-06 DIAGNOSIS — T451X5D Adverse effect of antineoplastic and immunosuppressive drugs, subsequent encounter: Secondary | ICD-10-CM | POA: Insufficient documentation

## 2021-08-06 DIAGNOSIS — M21371 Foot drop, right foot: Secondary | ICD-10-CM | POA: Diagnosis not present

## 2021-08-06 DIAGNOSIS — Z923 Personal history of irradiation: Secondary | ICD-10-CM | POA: Insufficient documentation

## 2021-08-06 DIAGNOSIS — C3490 Malignant neoplasm of unspecified part of unspecified bronchus or lung: Secondary | ICD-10-CM

## 2021-08-06 DIAGNOSIS — Z5112 Encounter for antineoplastic immunotherapy: Secondary | ICD-10-CM

## 2021-08-06 DIAGNOSIS — Z79899 Other long term (current) drug therapy: Secondary | ICD-10-CM | POA: Insufficient documentation

## 2021-08-06 DIAGNOSIS — Z5111 Encounter for antineoplastic chemotherapy: Secondary | ICD-10-CM | POA: Insufficient documentation

## 2021-08-06 DIAGNOSIS — R5383 Other fatigue: Secondary | ICD-10-CM | POA: Insufficient documentation

## 2021-08-06 DIAGNOSIS — D6481 Anemia due to antineoplastic chemotherapy: Secondary | ICD-10-CM

## 2021-08-06 DIAGNOSIS — E876 Hypokalemia: Secondary | ICD-10-CM | POA: Diagnosis not present

## 2021-08-06 DIAGNOSIS — R569 Unspecified convulsions: Secondary | ICD-10-CM | POA: Diagnosis not present

## 2021-08-06 DIAGNOSIS — C7931 Secondary malignant neoplasm of brain: Secondary | ICD-10-CM | POA: Diagnosis present

## 2021-08-06 LAB — CBC WITH DIFFERENTIAL/PLATELET
Abs Immature Granulocytes: 0.01 10*3/uL (ref 0.00–0.07)
Basophils Absolute: 0 10*3/uL (ref 0.0–0.1)
Basophils Relative: 1 %
Eosinophils Absolute: 0 10*3/uL (ref 0.0–0.5)
Eosinophils Relative: 0 %
HCT: 36.9 % (ref 36.0–46.0)
Hemoglobin: 11.9 g/dL — ABNORMAL LOW (ref 12.0–15.0)
Immature Granulocytes: 0 %
Lymphocytes Relative: 37 %
Lymphs Abs: 1.9 10*3/uL (ref 0.7–4.0)
MCH: 27.8 pg (ref 26.0–34.0)
MCHC: 32.2 g/dL (ref 30.0–36.0)
MCV: 86.2 fL (ref 80.0–100.0)
Monocytes Absolute: 0.6 10*3/uL (ref 0.1–1.0)
Monocytes Relative: 11 %
Neutro Abs: 2.6 10*3/uL (ref 1.7–7.7)
Neutrophils Relative %: 51 %
Platelets: 286 10*3/uL (ref 150–400)
RBC: 4.28 MIL/uL (ref 3.87–5.11)
RDW: 18.1 % — ABNORMAL HIGH (ref 11.5–15.5)
WBC: 5.1 10*3/uL (ref 4.0–10.5)
nRBC: 0 % (ref 0.0–0.2)

## 2021-08-06 LAB — COMPREHENSIVE METABOLIC PANEL
ALT: 14 U/L (ref 0–44)
AST: 17 U/L (ref 15–41)
Albumin: 4.1 g/dL (ref 3.5–5.0)
Alkaline Phosphatase: 64 U/L (ref 38–126)
Anion gap: 10 (ref 5–15)
BUN: 7 mg/dL (ref 6–20)
CO2: 27 mmol/L (ref 22–32)
Calcium: 9.2 mg/dL (ref 8.9–10.3)
Chloride: 99 mmol/L (ref 98–111)
Creatinine, Ser: 0.34 mg/dL — ABNORMAL LOW (ref 0.44–1.00)
GFR, Estimated: >60 mL/min (ref >60)
Glucose, Bld: 95 mg/dL (ref 70–99)
Potassium: 3.6 mmol/L (ref 3.5–5.1)
Sodium: 136 mmol/L (ref 135–145)
Total Bilirubin: 0.4 mg/dL (ref 0.3–1.2)
Total Protein: 7.4 g/dL (ref 6.5–8.1)

## 2021-08-06 MED ORDER — HEPARIN SOD (PORK) LOCK FLUSH 100 UNIT/ML IV SOLN
INTRAVENOUS | Status: AC
  Administered 2021-08-06: 500 [IU]
  Filled 2021-08-06: qty 5

## 2021-08-06 MED ORDER — SODIUM CHLORIDE 0.9 % IV SOLN
10.0000 mg | Freq: Once | INTRAVENOUS | Status: AC
  Administered 2021-08-06: 10 mg via INTRAVENOUS
  Filled 2021-08-06: qty 10

## 2021-08-06 MED ORDER — SODIUM CHLORIDE 0.9 % IV SOLN
1200.0000 mg | Freq: Once | INTRAVENOUS | Status: AC
  Administered 2021-08-06: 1200 mg via INTRAVENOUS
  Filled 2021-08-06: qty 20

## 2021-08-06 MED ORDER — SODIUM CHLORIDE 0.9 % IV SOLN
Freq: Once | INTRAVENOUS | Status: AC
  Filled 2021-08-06: qty 250

## 2021-08-06 MED ORDER — PALONOSETRON HCL INJECTION 0.25 MG/5ML
0.2500 mg | Freq: Once | INTRAVENOUS | Status: AC
  Administered 2021-08-06: 0.25 mg via INTRAVENOUS
  Filled 2021-08-06: qty 5

## 2021-08-06 MED ORDER — SODIUM CHLORIDE 0.9 % IV SOLN
150.0000 mg | Freq: Once | INTRAVENOUS | Status: AC
  Administered 2021-08-06: 150 mg via INTRAVENOUS
  Filled 2021-08-06: qty 150

## 2021-08-06 MED ORDER — SODIUM CHLORIDE 0.9 % IV SOLN
100.0000 mg/m2 | Freq: Once | INTRAVENOUS | Status: AC
  Administered 2021-08-06: 170 mg via INTRAVENOUS
  Filled 2021-08-06: qty 8.5

## 2021-08-06 MED ORDER — SODIUM CHLORIDE 0.9 % IV SOLN
525.0000 mg | Freq: Once | INTRAVENOUS | Status: AC
  Administered 2021-08-06: 530 mg via INTRAVENOUS
  Filled 2021-08-06: qty 53

## 2021-08-06 MED ORDER — HEPARIN SOD (PORK) LOCK FLUSH 100 UNIT/ML IV SOLN
500.0000 [IU] | Freq: Once | INTRAVENOUS | Status: AC | PRN
  Filled 2021-08-06: qty 5

## 2021-08-06 MED ORDER — SODIUM CHLORIDE 0.9 % IV SOLN: 750.0000 mg | Freq: Once | INTRAVENOUS | Status: DC

## 2021-08-06 NOTE — Progress Notes (Signed)
Hematology/Oncology progress note [phone] [phone]   Patient Care Team: Delsa Grana, PA-C as PCP - General (Family Medicine) Telford Nab, RN as Oncology Nurse Navigator  REFERRING PROVIDER: Delsa Grana, PA-C  CHIEF COMPLAINTS/REASON FOR VISIT:  small cell lung cancer  HISTORY OF PRESENTING ILLNESS:   Jenna Newton is a  55 y.o.  female with PMH listed below was seen in consultation at the request of  Delsa Grana, PA-C  for evaluation of lung mass  05/08/2021, patient presented to emergency room for evaluation of expressive aphasia.  Acute onset of symptoms.  She/has no other neurological deficits at that time.  In ER, patient was found to have expressive aphasia with intermittent word at rest, though receptive and follows commands. MRI brain with and without contrast showed multiple small peripherally enhancing lesions.  Primary differential consideration is metastatic disease.   Patient declined being admitted.  Was eventually convinced to stay to do CT chest abdomen pelvis with contrast. CT showed a 3.2 cm posterior left upper lobe mass extending from the left hilum, concerning for primary bronchogenic carcinoma.  Enlarged left hilar and prominent bilateral peritracheal lymph nodes are identified.  Worrisome for nodal metastasis.  Similar appearance of bilateral adrenal nodule compared with 12/17/2013.  Favor benign adenomas.  Coronary artery calcifications, enlarged fibroid uterus. Patient prefers to be discharged home.  She was sent on dexamethasone 4 mg twice daily.  And Keppra 500 mg twice daily.  Patient was referred to establish care with cancer center. Today she has no difficulty in speech.  She was accompanied by her cousin. She has some gait change with right foot drop. No additional episodes of expressive aphasia.  Patient drinks alcohol, 12 packs of beer /week, current every day smoker.   05/25/2021 left upper lobe biopsy via bronchoscopy  showed small cell lung cancer. left upper lobe brushing/ LUB lavage + small cell lung cancer,  Precarinal lymph node biopsy- not enough tissue  Patient has baseline alopecia 05/30/2021- 06/13/2021 palliative brain RT.  06/04/2021 cycle 1 carboplatin etoposide 06/25/2021, cycle 2 carboplatin etoposide and Tecentriq.  INTERVAL HISTORY Jenna Newton is a 55 y.o. female who has above history reviewed by me today presents for follow up visit for management of extensive small cell lung cancer  S/p cycle carboplatin etoposide.  Overall she tolerates well Patient is on Keppra 500 mg twice daily.  No new complaints.  No additional seizure episodes. Appetite is fair.  She has been off dexamethasone now.  Feels tired.  Review of Systems  Constitutional:  Positive for fatigue. Negative for appetite change, chills and fever.  HENT:   Negative for hearing loss and voice change.        Alopecia-chronic  Eyes:  Negative for eye problems.  Respiratory:  Negative for chest tightness and cough.   Cardiovascular:  Negative for chest pain.  Gastrointestinal:  Negative for abdominal distention, abdominal pain and blood in stool.  Endocrine: Negative for hot flashes.  Genitourinary:  Negative for difficulty urinating and frequency.   Musculoskeletal:  Negative for arthralgias and gait problem.  Skin:  Negative for itching and rash.  Neurological:  Negative for extremity weakness, gait problem, headaches, seizures and speech difficulty.  Hematological:  Negative for adenopathy.  Psychiatric/Behavioral:  Negative for confusion.    MEDICAL HISTORY:  Past Medical History:  Diagnosis Date   Anemia    GERD (gastroesophageal reflux disease)    Hypertension    Neuropathy    Tobacco abuse 03/14/2017   Vertigo  White matter disease of brain due to ischemia 12/04/2017   Head CT March 2019    SURGICAL HISTORY: Past Surgical History:  Procedure Laterality Date   none     PORTA CATH INSERTION N/A  06/18/2021   Procedure: PORTA CATH INSERTION;  Surgeon: Algernon Huxley, MD;  Location: [facility];  Service: Cardiovascular;  Laterality: N/A;   VIDEO BRONCHOSCOPY WITH ENDOBRONCHIAL NAVIGATION N/A 05/25/2021   Procedure: ROBOTIC ASSISTED VIDEO BRONCHOSCOPY WITH ENDOBRONCHIAL NAVIGATION;  Surgeon: Tyler Pita, MD;  Location: [facility];  Service: Pulmonary;  Laterality: N/A;   VIDEO BRONCHOSCOPY WITH ENDOBRONCHIAL ULTRASOUND N/A 05/25/2021   Procedure: VIDEO BRONCHOSCOPY WITH ENDOBRONCHIAL ULTRASOUND;  Surgeon: Tyler Pita, MD;  Location: [facility];  Service: Pulmonary;  Laterality: N/A;    SOCIAL HISTORY: Social History   Socioeconomic History   Marital status: Single    Spouse name: Not on file   Number of children: 0   Years of education: Not on file   Highest education level: Bachelor's degree (e.g., BA, AB, BS)  Occupational History   Not on file  Tobacco Use   Smoking status: Every Day    Packs/day: 1.00    Years: 25.00    Pack years: 25.00    Types: Cigarettes   Smokeless tobacco: Never  Vaping Use   Vaping Use: Never used  Substance and Sexual Activity   Alcohol use: Yes    Alcohol/week: 14.0 standard drinks    Types: 14 Cans of beer per week    Comment: 12 pack/ week   Drug use: No   Sexual activity: Yes    Partners: Male    Birth control/protection: Post-menopausal  Other Topics Concern   Not on file  Social History Narrative   Lives with brother   Social Determinants of Health   Financial Resource Strain: Not on file  Food Insecurity: Food Insecurity Present   Worried About Charity fundraiser in the Last Year: Sometimes true   Arboriculturist in the Last Year: Sometimes true  Transportation Needs: Not on file  Physical Activity: Not on file  Stress: Not on file  Social Connections: Not on file  Intimate Partner Violence: Not on file    FAMILY HISTORY: Family History  Problem Relation Age of Onset   Diabetes Mother    Hypertension  Mother    Emphysema Mother    Emphysema Father    Diabetes Brother    Alcohol abuse Brother    Diabetes Maternal Grandmother    Hypertension Maternal Grandmother    Blindness Maternal Grandmother    Cancer Maternal Grandfather        prostate   Emphysema Maternal Grandfather    Cancer Paternal Grandmother    Emphysema Paternal Grandfather     ALLERGIES:  is allergic to penicillins, meclizine, and sulfa antibiotics.  MEDICATIONS:  Current Outpatient Medications  Medication Sig Dispense Refill   amLODipine (NORVASC) 5 MG tablet Take 1 tablet (5 mg total) by mouth daily. 90 tablet 3   cromolyn (OPTICROM) 4 % ophthalmic solution Place 1 drop into both eyes 4 (four) times daily as needed. 10 mL 3   dexamethasone (DECADRON) 1 MG tablet Take 1 tablet (1 mg total) by mouth See admin instructions. Take 3mg  daily for 1 week and then take 2mg  daily for 1 week. 35 tablet 0   dexamethasone (DECADRON) 1 MG tablet Take 1 tablet (1 mg total) by mouth daily. 14 tablet 0   fluticasone (FLONASE)  50 MCG/ACT nasal spray Place 1 spray into both nostrils daily as needed for allergies.     hydrocortisone cream 0.5 % Apply 1 application topically 2 (two) times daily as needed for itching. 30 g 0   KLOR-CON M10 10 MEQ tablet TAKE 1 TABLET BY MOUTH EVERY DAY 30 tablet 0   levETIRAcetam (KEPPRA) 500 MG tablet TAKE 1 TABLET BY MOUTH TWICE A DAY 120 tablet 0   lidocaine-prilocaine (EMLA) cream Apply 1 application topically as needed. 30 g 3   montelukast (SINGULAIR) 10 MG tablet Take 1 tablet (10 mg total) by mouth at bedtime. 90 tablet 3   prochlorperazine (COMPAZINE) 10 MG tablet Take 1 tablet (10 mg total) by mouth every 6 (six) hours as needed (Nausea or vomiting). 30 tablet 1   saline (AYR) GEL Place 1 application into the nose every 4 (four) hours. 14.1 g 3   Tiotropium Bromide-Olodaterol (STIOLTO RESPIMAT) 2.5-2.5 MCG/ACT AERS Inhale 2 puffs into the lungs daily. 1 each 0   vitamin B-12 (CYANOCOBALAMIN)  500 MCG tablet Take 1 tablet (500 mcg total) by mouth daily. 100 tablet 3   ondansetron (ZOFRAN) 8 MG tablet Take 1 tablet (8 mg total) by mouth 2 (two) times daily as needed for refractory nausea / vomiting. Start on day 3 after carboplatin chemo. (Patient not taking: Reported on 06/25/2021) 30 tablet 1   No current facility-administered medications for this visit.   Facility-Administered Medications Ordered in Other Visits  Medication Dose Route Frequency Provider Last Rate Last Admin   etoposide (VEPESID) 170 mg in sodium chloride 0.9 % 500 mL chemo infusion  100 mg/m2 (Order-Specific) Intravenous Once Earlie Server, MD 509 mL/hr at 08/06/21 1221 170 mg at 08/06/21 1221   heparin lock flush 100 unit/mL  500 Units Intracatheter Once PRN Earlie Server, MD         PHYSICAL EXAMINATION: ECOG PERFORMANCE STATUS: 1 - Symptomatic but completely ambulatory Vitals:   08/06/21 0916  BP: 110/73  Pulse: 93  Temp: 97.9 F (36.6 C)   Filed Weights   08/06/21 0916  Weight: 144 lb (65.3 kg)      Physical Exam Constitutional:      General: She is not in acute distress. HENT:     Head: Normocephalic and atraumatic.  Eyes:     General: No scleral icterus. Cardiovascular:     Rate and Rhythm: Normal rate and regular rhythm.     Heart sounds: Normal heart sounds.  Pulmonary:     Effort: Pulmonary effort is normal. No respiratory distress.     Breath sounds: No wheezing.  Abdominal:     General: Bowel sounds are normal. There is no distension.     Palpations: Abdomen is soft.  Musculoskeletal:        General: No deformity. Normal range of motion.     Cervical back: Normal range of motion and neck supple.  Skin:    General: Skin is warm and dry.     Findings: No erythema or rash.  Neurological:     Mental Status: She is alert and oriented to person, place, and time. Mental status is at baseline.     Cranial Nerves: No cranial nerve deficit.     Coordination: Coordination normal.     Comments:  Gait has improved.   Psychiatric:        Mood and Affect: Mood normal.    LABORATORY DATA:  I have reviewed the data as listed Lab Results  Component Value Date  WBC 5.1 08/06/2021   HGB 11.9 (L) 08/06/2021   HCT 36.9 08/06/2021   MCV 86.2 08/06/2021   PLT 286 08/06/2021   Recent Labs    07/05/21 1104 07/16/21 0816 08/06/21 0905  NA 137 136 136  K 3.5 4.3 3.6  CL 99 96* 99  CO2 28 30 27   GLUCOSE 117* 108* 95  BUN 7 11 7   CREATININE 0.43* 0.38* 0.34*  CALCIUM 9.2 9.7 9.2  GFRNONAA >60 >60 >60  PROT 7.2 8.0 7.4  ALBUMIN 4.0 4.1 4.1  AST 17 19 17   ALT 27 19 14   ALKPHOS 55 73 64  BILITOT 0.2* 0.3 0.4    Iron/TIBC/Ferritin/ %Sat    Component Value Date/Time   FERRITIN 126 03/14/2017 0926       RADIOGRAPHIC STUDIES: I have personally reviewed the radiological images as listed and agreed with the findings in the report. No results found.    ASSESSMENT & PLAN:  1. Small cell lung cancer ([place])   2. Encounter for antineoplastic chemotherapy   3. Encounter for antineoplastic immunotherapy   4. Antineoplastic chemotherapy induced anemia   5. Seizure [facility])    Cancer Staging  Small cell lung cancer [facility]) Staging form: Lung, AJCC 8th Edition - Clinical stage from 06/01/2021: Stage IV (cT2a, cN3, cM1) - Signed by Earlie Server, MD on 06/01/2021  # Extensive small cell cancer, brain metastatic disease S/p 1 cycle of carboplatin + etoposide Finished RT. She tolerates well. Labs reviewed and discussed with patient proceed with cycle 4 carboplatin/etoposide/Tecentriq- [added since cycle 2] Plan to repeat image in 2 to 3 weeks.  PET scan for evaluation of treatment response and guiding further treatments.   # Brain metastasis and seizure S/p RT, s/p steroid taper.  Continue Keppra 500mg  BID. Off dexamethasone now.  # Anemia, chemotherapy induced. Mild.   Recommendation of influenza vaccination was discussed with patient and patient declined. Follow up 3 weeks lab MD  carboplatin/Etoposide/tecentriq    All questions were answered. The patient knows to call the clinic with any problems questions or concerns.  cc Delsa Grana, PA-C     Earlie Server, MD, PhD  08/06/2021

## 2021-08-06 NOTE — Telephone Encounter (Signed)
  Component Ref Range & Units 09:05  (08/06/21) 3 wk ago  (07/16/21) 1 mo ago  (07/05/21) 1 mo ago  (06/25/21) 1 mo ago  (06/14/21) 2 mo ago  (06/04/21) 3 mo ago  (05/08/21)  Potassium 3.5 - 5.1 mmol/L 3.6  4.3  3.5  4.0  3.4 Low   4.0  4.4 CM

## 2021-08-07 ENCOUNTER — Encounter: Payer: Self-pay | Admitting: Oncology

## 2021-08-07 ENCOUNTER — Inpatient Hospital Stay: Payer: BC Managed Care – PPO

## 2021-08-07 VITALS — BP 125/74 | HR 88 | Temp 98.0°F

## 2021-08-07 DIAGNOSIS — Z5112 Encounter for antineoplastic immunotherapy: Secondary | ICD-10-CM | POA: Diagnosis not present

## 2021-08-07 DIAGNOSIS — C3490 Malignant neoplasm of unspecified part of unspecified bronchus or lung: Secondary | ICD-10-CM

## 2021-08-07 MED ORDER — HEPARIN SOD (PORK) LOCK FLUSH 100 UNIT/ML IV SOLN
500.0000 [IU] | Freq: Once | INTRAVENOUS | Status: AC | PRN
  Administered 2021-08-07: 500 [IU]
  Filled 2021-08-07: qty 5

## 2021-08-07 MED ORDER — SODIUM CHLORIDE 0.9 % IV SOLN
10.0000 mg | Freq: Once | INTRAVENOUS | Status: AC
  Administered 2021-08-07: 10 mg via INTRAVENOUS
  Filled 2021-08-07: qty 10

## 2021-08-07 MED ORDER — SODIUM CHLORIDE 0.9 % IV SOLN
Freq: Once | INTRAVENOUS | Status: AC
  Filled 2021-08-07: qty 250

## 2021-08-07 MED ORDER — HEPARIN SOD (PORK) LOCK FLUSH 100 UNIT/ML IV SOLN
INTRAVENOUS | Status: AC
  Filled 2021-08-07: qty 5

## 2021-08-07 MED ORDER — SODIUM CHLORIDE 0.9 % IV SOLN
100.0000 mg/m2 | Freq: Once | INTRAVENOUS | Status: AC
  Administered 2021-08-07: 170 mg via INTRAVENOUS
  Filled 2021-08-07: qty 8.5

## 2021-08-07 NOTE — Patient Instructions (Signed)
[facility] CANCER CTR AT [place]  Discharge Instructions: Thank you for choosing [facility] to provide your oncology and hematology care.  If you have a lab appointment with the [facility], please go directly to the [facility] and check in at the registration area.  Wear comfortable clothing and clothing appropriate for easy access to any Portacath or PICC line.   We strive to give you quality time with your provider. You may need to reschedule your appointment if you arrive late (15 or more minutes).  Arriving late affects you and other patients whose appointments are after yours.  Also, if you miss three or more appointments without notifying the office, you may be dismissed from the clinic at the provider's discretion.      For prescription refill requests, have your pharmacy contact our office and allow 72 hours for refills to be completed.    Today you received the following chemotherapy and/or immunotherapy agents etoposide     To help prevent nausea and vomiting after your treatment, we encourage you to take your nausea medication as directed.  BELOW ARE SYMPTOMS THAT SHOULD BE REPORTED IMMEDIATELY: *FEVER GREATER THAN 100.4 F (38 C) OR HIGHER *CHILLS OR SWEATING *NAUSEA AND VOMITING THAT IS NOT CONTROLLED WITH YOUR NAUSEA MEDICATION *UNUSUAL SHORTNESS OF BREATH *UNUSUAL BRUISING OR BLEEDING *URINARY PROBLEMS (pain or burning when urinating, or frequent urination) *BOWEL PROBLEMS (unusual diarrhea, constipation, pain near the anus) TENDERNESS IN MOUTH AND THROAT WITH OR WITHOUT PRESENCE OF ULCERS (sore throat, sores in mouth, or a toothache) UNUSUAL RASH, SWELLING OR PAIN  UNUSUAL VAGINAL DISCHARGE OR ITCHING   Items with * indicate a potential emergency and should be followed up as soon as possible or go to the Emergency Department if any problems should occur.  Please show the CHEMOTHERAPY ALERT CARD or IMMUNOTHERAPY ALERT CARD at check-in to  the Emergency Department and triage nurse.  Should you have questions after your visit or need to cancel or reschedule your appointment, please contact [facility] CANCER [place] AT [place]  [phone] and follow the prompts.  Office hours are 8:00 a.m. to 4:30 p.m. Monday - Friday. Please note that voicemails left after 4:00 p.m. may not be returned until the following business day.  We are closed weekends and major holidays. You have access to a nurse at all times for urgent questions. Please call the main number to the clinic [phone] and follow the prompts.  For any non-urgent questions, you may also contact your provider using MyChart. We now offer e-Visits for anyone 36 and older to request care online for non-urgent symptoms. For details visit [email or web address].   Also download the MyChart app! Go to the app store, search "MyChart", open the app, select [place], and log in with your MyChart username and password.  Due to Covid, a mask is required upon entering the hospital/clinic. If you do not have a mask, one will be given to you upon arrival. For doctor visits, patients may have 1 support person aged 49 or older with them. For treatment visits, patients cannot have anyone with them due to current Covid guidelines and our immunocompromised population.

## 2021-08-08 ENCOUNTER — Inpatient Hospital Stay: Payer: BC Managed Care – PPO

## 2021-08-08 ENCOUNTER — Other Ambulatory Visit: Payer: Self-pay

## 2021-08-08 VITALS — BP 115/79 | HR 76 | Temp 95.6°F | Resp 20

## 2021-08-08 DIAGNOSIS — Z5112 Encounter for antineoplastic immunotherapy: Secondary | ICD-10-CM | POA: Diagnosis not present

## 2021-08-08 DIAGNOSIS — C3490 Malignant neoplasm of unspecified part of unspecified bronchus or lung: Secondary | ICD-10-CM

## 2021-08-08 MED ORDER — SODIUM CHLORIDE 0.9 % IV SOLN
10.0000 mg | Freq: Once | INTRAVENOUS | Status: AC
  Administered 2021-08-08: 10 mg via INTRAVENOUS
  Filled 2021-08-08: qty 10

## 2021-08-08 MED ORDER — HEPARIN SOD (PORK) LOCK FLUSH 100 UNIT/ML IV SOLN
INTRAVENOUS | Status: AC
  Administered 2021-08-08: 500 [IU]
  Filled 2021-08-08: qty 5

## 2021-08-08 MED ORDER — SODIUM CHLORIDE 0.9 % IV SOLN
100.0000 mg/m2 | Freq: Once | INTRAVENOUS | Status: AC
  Administered 2021-08-08: 170 mg via INTRAVENOUS
  Filled 2021-08-08: qty 8.5

## 2021-08-08 MED ORDER — SODIUM CHLORIDE 0.9 % IV SOLN
Freq: Once | INTRAVENOUS | Status: AC
  Filled 2021-08-08: qty 250

## 2021-08-08 MED ORDER — SODIUM CHLORIDE 0.9% FLUSH
10.0000 mL | INTRAVENOUS | Status: DC | PRN
  Administered 2021-08-08: 10 mL
  Filled 2021-08-08: qty 10

## 2021-08-08 MED ORDER — HEPARIN SOD (PORK) LOCK FLUSH 100 UNIT/ML IV SOLN
500.0000 [IU] | Freq: Once | INTRAVENOUS | Status: DC | PRN
  Filled 2021-08-08: qty 5

## 2021-08-08 NOTE — Patient Instructions (Signed)
[facility] CANCER CTR AT [place]  Discharge Instructions: Thank you for choosing [facility] to provide your oncology and hematology care.  If you have a lab appointment with the [facility], please go directly to the [facility] and check in at the registration area.  Wear comfortable clothing and clothing appropriate for easy access to any Portacath or PICC line.   We strive to give you quality time with your provider. You may need to reschedule your appointment if you arrive late (15 or more minutes).  Arriving late affects you and other patients whose appointments are after yours.  Also, if you miss three or more appointments without notifying the office, you may be dismissed from the clinic at the provider's discretion.      For prescription refill requests, have your pharmacy contact our office and allow 72 hours for refills to be completed.    Today you received the following chemotherapy and/or immunotherapy agents: Etoposide      To help prevent nausea and vomiting after your treatment, we encourage you to take your nausea medication as directed.  BELOW ARE SYMPTOMS THAT SHOULD BE REPORTED IMMEDIATELY: *FEVER GREATER THAN 100.4 F (38 C) OR HIGHER *CHILLS OR SWEATING *NAUSEA AND VOMITING THAT IS NOT CONTROLLED WITH YOUR NAUSEA MEDICATION *UNUSUAL SHORTNESS OF BREATH *UNUSUAL BRUISING OR BLEEDING *URINARY PROBLEMS (pain or burning when urinating, or frequent urination) *BOWEL PROBLEMS (unusual diarrhea, constipation, pain near the anus) TENDERNESS IN MOUTH AND THROAT WITH OR WITHOUT PRESENCE OF ULCERS (sore throat, sores in mouth, or a toothache) UNUSUAL RASH, SWELLING OR PAIN  UNUSUAL VAGINAL DISCHARGE OR ITCHING   Items with * indicate a potential emergency and should be followed up as soon as possible or go to the Emergency Department if any problems should occur.  Please show the CHEMOTHERAPY ALERT CARD or IMMUNOTHERAPY ALERT CARD at check-in to  the Emergency Department and triage nurse.  Should you have questions after your visit or need to cancel or reschedule your appointment, please contact [facility] CANCER [place] AT [place]  [phone] and follow the prompts.  Office hours are 8:00 a.m. to 4:30 p.m. Monday - Friday. Please note that voicemails left after 4:00 p.m. may not be returned until the following business day.  We are closed weekends and major holidays. You have access to a nurse at all times for urgent questions. Please call the main number to the clinic [phone] and follow the prompts.  For any non-urgent questions, you may also contact your provider using MyChart. We now offer e-Visits for anyone 71 and older to request care online for non-urgent symptoms. For details visit [email or web address].   Also download the MyChart app! Go to the app store, search "MyChart", open the app, select [facility], and log in with your MyChart username and password.  Due to Covid, a mask is required upon entering the hospital/clinic. If you do not have a mask, one will be given to you upon arrival. For doctor visits, patients may have 1 support person aged 79 or older with them. For treatment visits, patients cannot have anyone with them due to current Covid guidelines and our immunocompromised population. Etoposide, VP-16 injection What is this medication? ETOPOSIDE, VP-16 (e toe POE side) is a chemotherapy drug. It is used to treat testicular cancer, lung cancer, and other cancers. This medicine may be used for other purposes; ask your health care provider or pharmacist if you have questions. COMMON BRAND NAME(S): Etopophos, Toposar, VePesid What should I  tell my care team before I take this medication? They need to know if you have any of these conditions: infection kidney disease liver disease low blood counts, like low white cell, platelet, or red cell counts an unusual or allergic reaction to etoposide, other  medicines, foods, dyes, or preservatives pregnant or trying to get pregnant breast-feeding How should I use this medication? This medicine is for infusion into a vein. It is administered in a hospital or clinic by a specially trained health care professional. Talk to your pediatrician regarding the use of this medicine in children. Special care may be needed. Overdosage: If you think you have taken too much of this medicine contact a poison control center or emergency room at once. NOTE: This medicine is only for you. Do not share this medicine with others. What if I miss a dose? It is important not to miss your dose. Call your doctor or health care professional if you are unable to keep an appointment. What may interact with this medication? This medicine may interact with the following medications: warfarin This list may not describe all possible interactions. Give your health care provider a list of all the medicines, herbs, non-prescription drugs, or dietary supplements you use. Also tell them if you smoke, drink alcohol, or use illegal drugs. Some items may interact with your medicine. What should I watch for while using this medication? Visit your doctor for checks on your progress. This drug may make you feel generally unwell. This is not uncommon, as chemotherapy can affect healthy cells as well as cancer cells. Report any side effects. Continue your course of treatment even though you feel ill unless your doctor tells you to stop. In some cases, you may be given additional medicines to help with side effects. Follow all directions for their use. Call your doctor or health care professional for advice if you get a fever, chills or sore throat, or other symptoms of a cold or flu. Do not treat yourself. This drug decreases your body's ability to fight infections. Try to avoid being around people who are sick. This medicine may increase your risk to bruise or bleed. Call your doctor or health  care professional if you notice any unusual bleeding. Talk to your doctor about your risk of cancer. You may be more at risk for certain types of cancers if you take this medicine. Do not become pregnant while taking this medicine or for at least 6 months after stopping it. Women should inform their doctor if they wish to become pregnant or think they might be pregnant. Women of child-bearing potential will need to have a negative pregnancy test before starting this medicine. There is a potential for serious side effects to an unborn child. Talk to your health care professional or pharmacist for more information. Do not breast-feed an infant while taking this medicine. Men must use a latex condom during sexual contact with a woman while taking this medicine and for at least 4 months after stopping it. A latex condom is needed even if you have had a vasectomy. Contact your doctor right away if your partner becomes pregnant. Do not donate sperm while taking this medicine and for at least 4 months after you stop taking this medicine. Men should inform their doctors if they wish to father a child. This medicine may lower sperm counts. What side effects may I notice from receiving this medication? Side effects that you should report to your doctor or health care professional as soon  as possible: allergic reactions like skin rash, itching or hives, swelling of the face, lips, or tongue low blood counts - this medicine may decrease the number of white blood cells, red blood cells, and platelets. You may be at increased risk for infections and bleeding nausea, vomiting redness, blistering, peeling or loosening of the skin, including inside the mouth signs and symptoms of infection like fever; chills; cough; sore throat; pain or trouble passing urine signs and symptoms of low red blood cells or anemia such as unusually weak or tired; feeling faint or lightheaded; falls; breathing problems unusual bruising or  bleeding Side effects that usually do not require medical attention (report to your doctor or health care professional if they continue or are bothersome): changes in taste diarrhea hair loss loss of appetite mouth sores This list may not describe all possible side effects. Call your doctor for medical advice about side effects. You may report side effects to FDA at 1-800-FDA-1088. Where should I keep my medication? This drug is given in a hospital or clinic and will not be stored at home. NOTE: This sheet is a summary. It may not cover all possible information. If you have questions about this medicine, talk to your doctor, pharmacist, or health care provider.  2022 Elsevier/Gold Standard (2021-05-08 00:00:00)

## 2021-08-14 ENCOUNTER — Encounter: Payer: Self-pay | Admitting: Oncology

## 2021-08-14 ENCOUNTER — Telehealth: Payer: Self-pay | Admitting: *Deleted

## 2021-08-14 NOTE — Telephone Encounter (Signed)
Jenna Moffet, RN received envelope from patient with a request for accomodation for disability on 08/14/21 from pt's clinical team. Form is an "interactive questionnaire from [place]" RN to compete/process form.   Clinical notes: Patient dx with small cell lung ca on 05/25/21

## 2021-08-14 NOTE — Telephone Encounter (Signed)
Patient's form/Paperwork completed at 1500  Form pending md's signature on form, I will to pt's HR dept per her request.   Note: patient has called multiple times today and also sent a mychart msg requesting an update on her FMLA. She stated that she needs the forms to be given to her HR dept by 12/20. I responded back to the patient via mychart and explained to her that the form has been completed, but I am waiting on the doctor to sign the form. I inquired if she would like to pick this form up in person vs having me fax it to [facility]- attn: Linus Orn. There is not fax # on the form.

## 2021-08-15 ENCOUNTER — Encounter: Payer: Self-pay | Admitting: Oncology

## 2021-08-15 NOTE — Telephone Encounter (Signed)
Personally called the patient. Pt informed that forms were emailed and that I also sent her mychart msgs. Pt request that a copy of the original form be mailed to her address for her reference.  Copy of form put in mail for patient. Pt thanked me for updating her.

## 2021-08-15 NOTE — Telephone Encounter (Signed)
I called [facility]. Their fax # is not in working mode. Linus Orn prefers that the forms be sent via secure email to process the patient's leave of absence accomodation form.

## 2021-08-15 NOTE — Telephone Encounter (Signed)
I personally sent pt's forms via secure [employer] email to attn. Doran Heater ([email or web address])  Mychart msg sent to patient to update patient on status of completion.

## 2021-08-20 ENCOUNTER — Other Ambulatory Visit: Payer: Self-pay

## 2021-08-20 DIAGNOSIS — C3490 Malignant neoplasm of unspecified part of unspecified bronchus or lung: Secondary | ICD-10-CM

## 2021-08-21 ENCOUNTER — Ambulatory Visit: Payer: BC Managed Care – PPO

## 2021-08-28 ENCOUNTER — Other Ambulatory Visit: Payer: Self-pay

## 2021-08-28 ENCOUNTER — Inpatient Hospital Stay: Payer: BC Managed Care – PPO

## 2021-08-28 ENCOUNTER — Encounter: Payer: Self-pay | Admitting: Oncology

## 2021-08-28 ENCOUNTER — Inpatient Hospital Stay (HOSPITAL_BASED_OUTPATIENT_CLINIC_OR_DEPARTMENT_OTHER): Payer: BC Managed Care – PPO | Admitting: Oncology

## 2021-08-28 VITALS — BP 124/76 | HR 99 | Temp 97.5°F | Wt 146.0 lb

## 2021-08-28 DIAGNOSIS — Z5111 Encounter for antineoplastic chemotherapy: Secondary | ICD-10-CM

## 2021-08-28 DIAGNOSIS — Z5112 Encounter for antineoplastic immunotherapy: Secondary | ICD-10-CM | POA: Diagnosis not present

## 2021-08-28 DIAGNOSIS — D6481 Anemia due to antineoplastic chemotherapy: Secondary | ICD-10-CM | POA: Diagnosis not present

## 2021-08-28 DIAGNOSIS — C3490 Malignant neoplasm of unspecified part of unspecified bronchus or lung: Secondary | ICD-10-CM

## 2021-08-28 DIAGNOSIS — T451X5A Adverse effect of antineoplastic and immunosuppressive drugs, initial encounter: Secondary | ICD-10-CM

## 2021-08-28 DIAGNOSIS — R569 Unspecified convulsions: Secondary | ICD-10-CM | POA: Diagnosis not present

## 2021-08-28 LAB — CBC WITH DIFFERENTIAL/PLATELET
Abs Immature Granulocytes: 0.01 10*3/uL (ref 0.00–0.07)
Basophils Absolute: 0 10*3/uL (ref 0.0–0.1)
Basophils Relative: 0 %
Eosinophils Absolute: 0 10*3/uL (ref 0.0–0.5)
Eosinophils Relative: 0 %
HCT: 38.4 % (ref 36.0–46.0)
Hemoglobin: 12.1 g/dL (ref 12.0–15.0)
Immature Granulocytes: 0 %
Lymphocytes Relative: 45 %
Lymphs Abs: 2.1 10*3/uL (ref 0.7–4.0)
MCH: 27.1 pg (ref 26.0–34.0)
MCHC: 31.5 g/dL (ref 30.0–36.0)
MCV: 86.1 fL (ref 80.0–100.0)
Monocytes Absolute: 0.5 10*3/uL (ref 0.1–1.0)
Monocytes Relative: 11 %
Neutro Abs: 2.1 10*3/uL (ref 1.7–7.7)
Neutrophils Relative %: 44 %
Platelets: 296 10*3/uL (ref 150–400)
RBC: 4.46 MIL/uL (ref 3.87–5.11)
RDW: 19.2 % — ABNORMAL HIGH (ref 11.5–15.5)
WBC: 4.7 10*3/uL (ref 4.0–10.5)
nRBC: 0 % (ref 0.0–0.2)

## 2021-08-28 LAB — COMPREHENSIVE METABOLIC PANEL
ALT: 15 U/L (ref 0–44)
AST: 16 U/L (ref 15–41)
Albumin: 3.9 g/dL (ref 3.5–5.0)
Alkaline Phosphatase: 70 U/L (ref 38–126)
Anion gap: 8 (ref 5–15)
BUN: 8 mg/dL (ref 6–20)
CO2: 28 mmol/L (ref 22–32)
Calcium: 9.2 mg/dL (ref 8.9–10.3)
Chloride: 99 mmol/L (ref 98–111)
Creatinine, Ser: 0.35 mg/dL — ABNORMAL LOW (ref 0.44–1.00)
GFR, Estimated: >60 mL/min (ref >60)
Glucose, Bld: 98 mg/dL (ref 70–99)
Potassium: 3.4 mmol/L — ABNORMAL LOW (ref 3.5–5.1)
Sodium: 135 mmol/L (ref 135–145)
Total Bilirubin: 0.4 mg/dL (ref 0.3–1.2)
Total Protein: 7.5 g/dL (ref 6.5–8.1)

## 2021-08-28 LAB — TSH: TSH: 1.876 u[IU]/mL (ref 0.350–4.500)

## 2021-08-28 MED ORDER — SODIUM CHLORIDE 0.9 % IV SOLN
530.0000 mg | Freq: Once | INTRAVENOUS | Status: AC
  Administered 2021-08-28: 12:00:00 530 mg via INTRAVENOUS
  Filled 2021-08-28: qty 53

## 2021-08-28 MED ORDER — SODIUM CHLORIDE 0.9 % IV SOLN
100.0000 mg/m2 | Freq: Once | INTRAVENOUS | Status: AC
  Administered 2021-08-28: 12:00:00 170 mg via INTRAVENOUS
  Filled 2021-08-28: qty 8.5

## 2021-08-28 MED ORDER — SODIUM CHLORIDE 0.9 % IV SOLN
Freq: Once | INTRAVENOUS | Status: AC
  Filled 2021-08-28: qty 250

## 2021-08-28 MED ORDER — SODIUM CHLORIDE 0.9 % IV SOLN
150.0000 mg | Freq: Once | INTRAVENOUS | Status: AC
  Administered 2021-08-28: 11:00:00 150 mg via INTRAVENOUS
  Filled 2021-08-28: qty 5

## 2021-08-28 MED ORDER — POTASSIUM CHLORIDE CRYS ER 20 MEQ PO TBCR: 20.0000 meq | EXTENDED_RELEASE_TABLET | Freq: Every day | ORAL | 1 refills | Status: DC

## 2021-08-28 MED ORDER — PALONOSETRON HCL INJECTION 0.25 MG/5ML
0.2500 mg | Freq: Once | INTRAVENOUS | Status: AC
  Administered 2021-08-28: 10:00:00 0.25 mg via INTRAVENOUS
  Filled 2021-08-28: qty 5

## 2021-08-28 MED ORDER — HEPARIN SOD (PORK) LOCK FLUSH 100 UNIT/ML IV SOLN
500.0000 [IU] | Freq: Once | INTRAVENOUS | Status: AC | PRN
  Administered 2021-08-28: 13:00:00 500 [IU]
  Filled 2021-08-28: qty 5

## 2021-08-28 MED ORDER — SODIUM CHLORIDE 0.9 % IV SOLN
1200.0000 mg | Freq: Once | INTRAVENOUS | Status: AC
  Administered 2021-08-28: 11:00:00 1200 mg via INTRAVENOUS
  Filled 2021-08-28: qty 20

## 2021-08-28 MED ORDER — SODIUM CHLORIDE 0.9 % IV SOLN
10.0000 mg | Freq: Once | INTRAVENOUS | Status: AC
  Administered 2021-08-28: 10:00:00 10 mg via INTRAVENOUS
  Filled 2021-08-28: qty 10

## 2021-08-28 MED ORDER — SODIUM CHLORIDE 0.9% FLUSH
10.0000 mL | Freq: Once | INTRAVENOUS | Status: AC
  Administered 2021-08-28: 09:00:00 10 mL via INTRAVENOUS
  Filled 2021-08-28: qty 10

## 2021-08-28 NOTE — Progress Notes (Signed)
Hematology/Oncology progress note [phone] [phone]   Patient Care Team: Delsa Grana, PA-C as PCP - General (Family Medicine) Telford Nab, RN as Oncology Nurse Navigator  REFERRING PROVIDER: Delsa Grana, PA-C  CHIEF COMPLAINTS/REASON FOR VISIT:  small cell lung cancer  HISTORY OF PRESENTING ILLNESS:   Jenna Newton is a  55 y.o.  female with PMH listed below was seen in consultation at the request of  Delsa Grana, PA-C  for evaluation of lung mass  05/08/2021, patient presented to emergency room for evaluation of expressive aphasia.  Acute onset of symptoms.  She/has no other neurological deficits at that time.  In ER, patient was found to have expressive aphasia with intermittent word at rest, though receptive and follows commands. MRI brain with and without contrast showed multiple small peripherally enhancing lesions.  Primary differential consideration is metastatic disease.   Patient declined being admitted.  Was eventually convinced to stay to do CT chest abdomen pelvis with contrast. CT showed a 3.2 cm posterior left upper lobe mass extending from the left hilum, concerning for primary bronchogenic carcinoma.  Enlarged left hilar and prominent bilateral peritracheal lymph nodes are identified.  Worrisome for nodal metastasis.  Similar appearance of bilateral adrenal nodule compared with 12/17/2013.  Favor benign adenomas.  Coronary artery calcifications, enlarged fibroid uterus. Patient prefers to be discharged home.  She was sent on dexamethasone 4 mg twice daily.  And Keppra 500 mg twice daily.  Patient was referred to establish care with cancer center. Today she has no difficulty in speech.  She was accompanied by her cousin. She has some gait change with right foot drop. No additional episodes of expressive aphasia.  Patient drinks alcohol, 12 packs of beer /week, current every day smoker.   05/25/2021 left upper lobe biopsy via bronchoscopy  showed small cell lung cancer. left upper lobe brushing/ LUB lavage + small cell lung cancer,  Precarinal lymph node biopsy- not enough tissue  Patient has baseline alopecia 05/30/2021- 06/13/2021 palliative brain RT.  06/04/2021 cycle 1 carboplatin etoposide 06/25/2021, cycle 2 carboplatin etoposide and Tecentriq.  INTERVAL HISTORY Jenna Newton is a 55 y.o. female who has above history reviewed by me today presents for follow up visit for management of extensive small cell lung cancer  S/p 4 cycle carboplatin etoposide.  Overall she tolerates well Overall patient tolerates well.  She has also had finished brain radiation Denies any fever, chills, nausea vomiting diarrhea.  No additional seizure episodes.  She is on Keppra 500 mg twice daily Interim PET scan was not approved by her insurance.  CT scan was scheduled, no available appointment prior to today's visit.  Review of Systems  Constitutional:  Positive for fatigue. Negative for appetite change, chills and fever.  HENT:   Negative for hearing loss and voice change.        Alopecia-chronic  Eyes:  Negative for eye problems.  Respiratory:  Negative for chest tightness and cough.   Cardiovascular:  Negative for chest pain.  Gastrointestinal:  Negative for abdominal distention, abdominal pain and blood in stool.  Endocrine: Negative for hot flashes.  Genitourinary:  Negative for difficulty urinating and frequency.   Musculoskeletal:  Negative for arthralgias and gait problem.  Skin:  Negative for itching and rash.  Neurological:  Negative for extremity weakness, gait problem, headaches, seizures and speech difficulty.  Hematological:  Negative for adenopathy.  Psychiatric/Behavioral:  Negative for confusion.    MEDICAL HISTORY:  Past Medical History:  Diagnosis Date   Anemia  GERD (gastroesophageal reflux disease)    Hypertension    Neuropathy    Tobacco abuse 03/14/2017   Vertigo    White matter disease of brain due to  ischemia 12/04/2017   Head CT March 2019    SURGICAL HISTORY: Past Surgical History:  Procedure Laterality Date   none     PORTA CATH INSERTION N/A 06/18/2021   Procedure: PORTA CATH INSERTION;  Surgeon: Algernon Huxley, MD;  Location: [facility];  Service: Cardiovascular;  Laterality: N/A;   VIDEO BRONCHOSCOPY WITH ENDOBRONCHIAL NAVIGATION N/A 05/25/2021   Procedure: ROBOTIC ASSISTED VIDEO BRONCHOSCOPY WITH ENDOBRONCHIAL NAVIGATION;  Surgeon: Tyler Pita, MD;  Location: [facility];  Service: Pulmonary;  Laterality: N/A;   VIDEO BRONCHOSCOPY WITH ENDOBRONCHIAL ULTRASOUND N/A 05/25/2021   Procedure: VIDEO BRONCHOSCOPY WITH ENDOBRONCHIAL ULTRASOUND;  Surgeon: Tyler Pita, MD;  Location: [facility];  Service: Pulmonary;  Laterality: N/A;    SOCIAL HISTORY: Social History   Socioeconomic History   Marital status: Single    Spouse name: Not on file   Number of children: 0   Years of education: Not on file   Highest education level: Bachelor's degree (e.g., BA, AB, BS)  Occupational History   Not on file  Tobacco Use   Smoking status: Every Day    Packs/day: 1.00    Years: 25.00    Pack years: 25.00    Types: Cigarettes   Smokeless tobacco: Never  Vaping Use   Vaping Use: Never used  Substance and Sexual Activity   Alcohol use: Yes    Alcohol/week: 14.0 standard drinks    Types: 14 Cans of beer per week    Comment: 12 pack/ week   Drug use: No   Sexual activity: Yes    Partners: Male    Birth control/protection: Post-menopausal  Other Topics Concern   Not on file  Social History Narrative   Lives with brother   Social Determinants of Health   Financial Resource Strain: Not on file  Food Insecurity: Food Insecurity Present   Worried About Charity fundraiser in the Last Year: Sometimes true   Arboriculturist in the Last Year: Sometimes true  Transportation Needs: Not on file  Physical Activity: Not on file  Stress: Not on file  Social Connections:  Not on file  Intimate Partner Violence: Not on file    FAMILY HISTORY: Family History  Problem Relation Age of Onset   Diabetes Mother    Hypertension Mother    Emphysema Mother    Emphysema Father    Diabetes Brother    Alcohol abuse Brother    Diabetes Maternal Grandmother    Hypertension Maternal Grandmother    Blindness Maternal Grandmother    Cancer Maternal Grandfather        prostate   Emphysema Maternal Grandfather    Cancer Paternal Grandmother    Emphysema Paternal Grandfather     ALLERGIES:  is allergic to penicillins, meclizine, and sulfa antibiotics.  MEDICATIONS:  Current Outpatient Medications  Medication Sig Dispense Refill   amLODipine (NORVASC) 5 MG tablet Take 1 tablet (5 mg total) by mouth daily. 90 tablet 3   atorvastatin (LIPITOR) 40 MG tablet Take 40 mg by mouth at bedtime. 1 Tablet(s) By Mouth Every Night     cromolyn (OPTICROM) 4 % ophthalmic solution Place 1 drop into both eyes 4 (four) times daily as needed. 10 mL 3   dexamethasone (DECADRON) 1 MG tablet Take 1 tablet (1 mg  total) by mouth See admin instructions. Take 3mg  daily for 1 week and then take 2mg  daily for 1 week. 35 tablet 0   dexamethasone (DECADRON) 1 MG tablet Take 1 tablet (1 mg total) by mouth daily. 14 tablet 0   fluticasone (FLONASE) 50 MCG/ACT nasal spray Place 1 spray into both nostrils daily as needed for allergies.     hydrocortisone cream 0.5 % Apply 1 application topically 2 (two) times daily as needed for itching. 30 g 0   levETIRAcetam (KEPPRA) 500 MG tablet TAKE 1 TABLET BY MOUTH TWICE A DAY 120 tablet 0   lidocaine-prilocaine (EMLA) cream Apply 1 application topically as needed. 30 g 3   montelukast (SINGULAIR) 10 MG tablet Take 1 tablet (10 mg total) by mouth at bedtime. 90 tablet 3   ondansetron (ZOFRAN) 8 MG tablet Take 1 tablet (8 mg total) by mouth 2 (two) times daily as needed for refractory nausea / vomiting. Start on day 3 after carboplatin chemo. 30 tablet 1    potassium chloride SA (KLOR-CON M) 20 MEQ tablet Take 1 tablet (20 mEq total) by mouth daily. 30 tablet 1   prochlorperazine (COMPAZINE) 10 MG tablet Take 1 tablet (10 mg total) by mouth every 6 (six) hours as needed (Nausea or vomiting). 30 tablet 1   saline (AYR) GEL Place 1 application into the nose every 4 (four) hours. 14.1 g 3   Tiotropium Bromide-Olodaterol (STIOLTO RESPIMAT) 2.5-2.5 MCG/ACT AERS Inhale 2 puffs into the lungs daily. 1 each 0   vitamin B-12 (CYANOCOBALAMIN) 500 MCG tablet Take 1 tablet (500 mcg total) by mouth daily. 100 tablet 3   No current facility-administered medications for this visit.   Facility-Administered Medications Ordered in Other Visits  Medication Dose Route Frequency Provider Last Rate Last Admin   etoposide (VEPESID) 170 mg in sodium chloride 0.9 % 500 mL chemo infusion  100 mg/m2 (Order-Specific) Intravenous Once Earlie Server, MD 509 mL/hr at 08/28/21 1212 170 mg at 08/28/21 1212   heparin lock flush 100 unit/mL  500 Units Intracatheter Once PRN Earlie Server, MD         PHYSICAL EXAMINATION: ECOG PERFORMANCE STATUS: 1 - Symptomatic but completely ambulatory Vitals:   08/28/21 0908  BP: 124/76  Pulse: 99  Temp: (!) 97.5 F (36.4 C)   Filed Weights   08/28/21 0908  Weight: 146 lb (66.2 kg)      Physical Exam Constitutional:      General: She is not in acute distress. HENT:     Head: Normocephalic and atraumatic.     Comments: Alopecia Eyes:     General: No scleral icterus. Cardiovascular:     Rate and Rhythm: Normal rate and regular rhythm.     Heart sounds: Normal heart sounds.  Pulmonary:     Effort: Pulmonary effort is normal. No respiratory distress.     Breath sounds: No wheezing.  Abdominal:     General: Bowel sounds are normal. There is no distension.     Palpations: Abdomen is soft.  Musculoskeletal:        General: No deformity. Normal range of motion.     Cervical back: Normal range of motion and neck supple.  Skin:     General: Skin is warm and dry.     Findings: No erythema or rash.  Neurological:     Mental Status: She is alert and oriented to person, place, and time. Mental status is at baseline.     Cranial Nerves: No cranial nerve  deficit.     Coordination: Coordination normal.     Comments: Gait has improved.   Psychiatric:        Mood and Affect: Mood normal.    LABORATORY DATA:  I have reviewed the data as listed Lab Results  Component Value Date   WBC 4.7 08/28/2021   HGB 12.1 08/28/2021   HCT 38.4 08/28/2021   MCV 86.1 08/28/2021   PLT 296 08/28/2021   Recent Labs    07/16/21 0816 08/06/21 0905 08/28/21 0845  NA 136 136 135  K 4.3 3.6 3.4*  CL 96* 99 99  CO2 30 27 28   GLUCOSE 108* 95 98  BUN 11 7 8   CREATININE 0.38* 0.34* 0.35*  CALCIUM 9.7 9.2 9.2  GFRNONAA >60 >60 >60  PROT 8.0 7.4 7.5  ALBUMIN 4.1 4.1 3.9  AST 19 17 16   ALT 19 14 15   ALKPHOS 73 64 70  BILITOT 0.3 0.4 0.4    Iron/TIBC/Ferritin/ %Sat    Component Value Date/Time   FERRITIN 126 03/14/2017 0926       RADIOGRAPHIC STUDIES: I have personally reviewed the radiological images as listed and agreed with the findings in the report. No results found.    ASSESSMENT & PLAN:  1. Small cell lung cancer ([place])   2. Encounter for antineoplastic chemotherapy   3. Encounter for antineoplastic immunotherapy   4. Antineoplastic chemotherapy induced anemia   5. Seizure [facility])    Cancer Staging  Small cell lung cancer [facility]) Staging form: Lung, AJCC 8th Edition - Clinical stage from 06/01/2021: Stage IV (cT2a, cN3, cM1) - Signed by Earlie Server, MD on 06/01/2021  # Extensive small cell cancer, brain metastatic disease S/p 1 cycle of carboplatin + etoposide Finished RT. She tolerates well. Labs are reviewed discussed with patient. CT scan is scheduled on 09/12/2020. Will proceed with cycle 5 carboplatin/etoposide/Tecentriq. G-CSF was initially not added due to patient being on radiation.  So far she has tolerated  this regimen with no signs of neutropenia.  # Brain metastasis and seizure S/p RT, s/p steroid taper.  Continue Keppra 500mg  BID. Off dexamethasone now.  #Hypokalemia, recommend patient to increase potassium chloride to 20 M EQ daily.  Recommendation of influenza vaccination was discussed with patient and patient declined. Follow up 3 weeks lab MD carboplatin/Etoposide/tecentriq and also review CT scan results.   All questions were answered. The patient knows to call the clinic with any problems questions or concerns.  cc Delsa Grana, PA-C     Earlie Server, MD, PhD  08/28/2021

## 2021-08-28 NOTE — Progress Notes (Signed)
Patient here for follow up. Patient states she experiences Vertigo episodes at times.

## 2021-08-28 NOTE — Patient Instructions (Signed)
[facility] [place]  Discharge Instructions: ?Thank you for choosing [facility] to provide your oncology and hematology care.  ?If you have a lab appointment with the Cancer Center, please go directly to the Cancer Center and check in at the registration area. ? ?Wear comfortable clothing and clothing appropriate for easy access to any Portacath or PICC line.  ? ?We strive to give you quality time with your provider. You may need to reschedule your appointment if you arrive late (15 or more minutes).  Arriving late affects you and other patients whose appointments are after yours.  Also, if you miss three or more appointments without notifying the office, you may be dismissed from the clinic at the provider?s discretion.    ?  ?For prescription refill requests, have your pharmacy contact our office and allow 72 hours for refills to be completed.   ? ?  ?To help prevent nausea and vomiting after your treatment, we encourage you to take your nausea medication as directed. ? ?BELOW ARE SYMPTOMS THAT SHOULD BE REPORTED IMMEDIATELY: ?*FEVER GREATER THAN 100.4 F (38 ?C) OR HIGHER ?*CHILLS OR SWEATING ?*NAUSEA AND VOMITING THAT IS NOT CONTROLLED WITH YOUR NAUSEA MEDICATION ?*UNUSUAL SHORTNESS OF BREATH ?*UNUSUAL BRUISING OR BLEEDING ?*URINARY PROBLEMS (pain or burning when urinating, or frequent urination) ?*BOWEL PROBLEMS (unusual diarrhea, constipation, pain near the anus) ?TENDERNESS IN MOUTH AND THROAT WITH OR WITHOUT PRESENCE OF ULCERS (sore throat, sores in mouth, or a toothache) ?UNUSUAL RASH, SWELLING OR PAIN  ?UNUSUAL VAGINAL DISCHARGE OR ITCHING  ? ?Items with * indicate a potential emergency and should be followed up as soon as possible or go to the Emergency Department if any problems should occur. ? ?Please show the CHEMOTHERAPY ALERT CARD or IMMUNOTHERAPY ALERT CARD at check-in to the Emergency Department and triage nurse. ? ?Should you have questions after your visit  or need to cancel or reschedule your appointment, please contact [facility] [place]  [phone] and follow the prompts.  Office hours are 8:00 a.m. to 4:30 p.m. Monday - Friday. Please note that voicemails left after 4:00 p.m. may not be returned until the following business day.  We are closed weekends and major holidays. You have access to a nurse at all times for urgent questions. Please call the main number to the clinic [phone] and follow the prompts. ? ?For any non-urgent questions, you may also contact your provider using MyChart. We now offer e-Visits for anyone 18 and older to request care online for non-urgent symptoms. For details visit [email or web address]. ?  ?Also download the MyChart app! Go to the app store, search "MyChart", open the app, select , and log in with your MyChart username and password. ? ?Due to Covid, a mask is required upon entering the hospital/clinic. If you do not have a mask, one will be given to you upon arrival. For doctor visits, patients may have 1 support person aged 18 or older with them. For treatment visits, patients cannot have anyone with them due to current Covid guidelines and our immunocompromised population.  ?

## 2021-08-28 NOTE — Progress Notes (Signed)
Nutrition  Patient requested assistance with ensure.  Samples left with coupons for patient at registration desk to pick up after treatment.  Meisha Salone B. Zenia Resides, [place], [place] Registered Dietitian [phone] (mobile)

## 2021-08-29 ENCOUNTER — Inpatient Hospital Stay: Payer: BC Managed Care – PPO

## 2021-08-29 VITALS — BP 131/88 | HR 95 | Temp 99.0°F

## 2021-08-29 DIAGNOSIS — C3490 Malignant neoplasm of unspecified part of unspecified bronchus or lung: Secondary | ICD-10-CM

## 2021-08-29 DIAGNOSIS — Z5112 Encounter for antineoplastic immunotherapy: Secondary | ICD-10-CM | POA: Diagnosis not present

## 2021-08-29 MED ORDER — HEPARIN SOD (PORK) LOCK FLUSH 100 UNIT/ML IV SOLN
INTRAVENOUS | Status: AC
  Administered 2021-08-29: 15:00:00 500 [IU]
  Filled 2021-08-29: qty 5

## 2021-08-29 MED ORDER — SODIUM CHLORIDE 0.9 % IV SOLN
100.0000 mg/m2 | Freq: Once | INTRAVENOUS | Status: AC
  Administered 2021-08-29: 14:00:00 170 mg via INTRAVENOUS
  Filled 2021-08-29: qty 8.5

## 2021-08-29 MED ORDER — HEPARIN SOD (PORK) LOCK FLUSH 100 UNIT/ML IV SOLN
500.0000 [IU] | Freq: Once | INTRAVENOUS | Status: AC | PRN
  Filled 2021-08-29: qty 5

## 2021-08-29 MED ORDER — SODIUM CHLORIDE 0.9 % IV SOLN
10.0000 mg | Freq: Once | INTRAVENOUS | Status: AC
  Administered 2021-08-29: 13:00:00 10 mg via INTRAVENOUS
  Filled 2021-08-29: qty 1

## 2021-08-29 MED ORDER — SODIUM CHLORIDE 0.9 % IV SOLN
Freq: Once | INTRAVENOUS | Status: AC
  Filled 2021-08-29: qty 250

## 2021-08-29 NOTE — Patient Instructions (Signed)
[facility] [place]  Discharge Instructions: Thank you for choosing [facility] to provide your oncology and hematology care.  If you have a lab appointment with the [facility], please go directly to the [facility] and check in at the registration area.  Wear comfortable clothing and clothing appropriate for easy access to any Portacath or PICC line.   We strive to give you quality time with your provider. You may need to reschedule your appointment if you arrive late (15 or more minutes).  Arriving late affects you and other patients whose appointments are after yours.  Also, if you miss three or more appointments without notifying the office, you may be dismissed from the clinic at the providers discretion.      For prescription refill requests, have your pharmacy contact our office and allow 72 hours for refills to be completed.    Today you received the following chemotherapy and/or immunotherapy agents etoposide    To help prevent nausea and vomiting after your treatment, we encourage you to take your nausea medication as directed.  BELOW ARE SYMPTOMS THAT SHOULD BE REPORTED IMMEDIATELY: *FEVER GREATER THAN 100.4 F (38 C) OR HIGHER *CHILLS OR SWEATING *NAUSEA AND VOMITING THAT IS NOT CONTROLLED WITH YOUR NAUSEA MEDICATION *UNUSUAL SHORTNESS OF BREATH *UNUSUAL BRUISING OR BLEEDING *URINARY PROBLEMS (pain or burning when urinating, or frequent urination) *BOWEL PROBLEMS (unusual diarrhea, constipation, pain near the anus) TENDERNESS IN MOUTH AND THROAT WITH OR WITHOUT PRESENCE OF ULCERS (sore throat, sores in mouth, or a toothache) UNUSUAL RASH, SWELLING OR PAIN  UNUSUAL VAGINAL DISCHARGE OR ITCHING   Items with * indicate a potential emergency and should be followed up as soon as possible or go to the Emergency Department if any problems should occur.  Please show the CHEMOTHERAPY ALERT CARD or IMMUNOTHERAPY ALERT CARD at check-in to the  Emergency Department and triage nurse.  Should you have questions after your visit or need to cancel or reschedule your appointment, please contact [facility] [place] [facility] [place]  [phone] and follow the prompts.  Office hours are 8:00 a.m. to 4:30 p.m. Monday - Friday. Please note that voicemails left after 4:00 p.m. may not be returned until the following business day.  We are closed weekends and major holidays. You have access to a nurse at all times for urgent questions. Please call the main number to the clinic [phone] and follow the prompts.  For any non-urgent questions, you may also contact your provider using MyChart. We now offer e-Visits for anyone 11 and older to request care online for non-urgent symptoms. For details visit [email or web address].   Also download the MyChart app! Go to the app store, search "MyChart", open the app, select [facility], and log in with your MyChart username and password.  Due to Covid, a mask is required upon entering the hospital/clinic. If you do not have a mask, one will be given to you upon arrival. For doctor visits, patients may have 1 support person aged 32 or older with them. For treatment visits, patients cannot have anyone with them due to current Covid guidelines and our immunocompromised population.

## 2021-08-30 ENCOUNTER — Inpatient Hospital Stay: Payer: BC Managed Care – PPO

## 2021-08-30 ENCOUNTER — Other Ambulatory Visit: Payer: Self-pay

## 2021-08-30 ENCOUNTER — Inpatient Hospital Stay: Payer: BC Managed Care – PPO | Admitting: Licensed Clinical Social Worker

## 2021-08-30 ENCOUNTER — Encounter: Payer: Self-pay | Admitting: Licensed Clinical Social Worker

## 2021-08-30 VITALS — BP 122/74 | HR 69 | Temp 96.3°F | Resp 18

## 2021-08-30 DIAGNOSIS — C3490 Malignant neoplasm of unspecified part of unspecified bronchus or lung: Secondary | ICD-10-CM

## 2021-08-30 DIAGNOSIS — Z5112 Encounter for antineoplastic immunotherapy: Secondary | ICD-10-CM | POA: Diagnosis not present

## 2021-08-30 MED ORDER — HEPARIN SOD (PORK) LOCK FLUSH 100 UNIT/ML IV SOLN
500.0000 [IU] | Freq: Once | INTRAVENOUS | Status: AC | PRN
  Administered 2021-08-30: 15:00:00 500 [IU]
  Filled 2021-08-30: qty 5

## 2021-08-30 MED ORDER — SODIUM CHLORIDE 0.9% FLUSH
10.0000 mL | INTRAVENOUS | Status: DC | PRN
  Administered 2021-08-30: 13:00:00 10 mL
  Filled 2021-08-30: qty 10

## 2021-08-30 MED ORDER — HEPARIN SOD (PORK) LOCK FLUSH 100 UNIT/ML IV SOLN
INTRAVENOUS | Status: AC
  Filled 2021-08-30: qty 5

## 2021-08-30 MED ORDER — SODIUM CHLORIDE 0.9 % IV SOLN
100.0000 mg/m2 | Freq: Once | INTRAVENOUS | Status: AC
  Administered 2021-08-30: 14:00:00 170 mg via INTRAVENOUS
  Filled 2021-08-30: qty 8.5

## 2021-08-30 MED ORDER — SODIUM CHLORIDE 0.9 % IV SOLN
Freq: Once | INTRAVENOUS | Status: AC
  Filled 2021-08-30: qty 250

## 2021-08-30 MED ORDER — SODIUM CHLORIDE 0.9 % IV SOLN
10.0000 mg | Freq: Once | INTRAVENOUS | Status: AC
  Administered 2021-08-30: 13:00:00 10 mg via INTRAVENOUS
  Filled 2021-08-30: qty 1

## 2021-08-30 NOTE — Progress Notes (Signed)
[place] Work  Initial Assessment   Jenna Newton is a 55 y.o. year old female seen in [facility] . Clinical Social Work was referred by Ander Purpura RN for assessment of psychosocial needs.   SDOH (Social Determinants of Health) assessments performed: Yes   Distress Screen completed: No ONCBCN DISTRESS SCREENING 05/14/2021  Screening Type Initial Screening  Distress experienced in past week (1-10) 0      Family/Social Information:  Housing Arrangement: patient lives with bother and sister-in-law . Family members/support persons in your life? Family Transportation concerns: no  Employment: Unemployed, Disabled, and Out of work due to pain. Income source: No income and currently working with an attorney for disability application.  Patient has short-term disability until October 2022, but is unable to continue working due to health issues and short-term disability was terminated. Financial concerns: Yes, due to illness and/or loss of work during treatment Type of concern: Rent/ mortgage, Medical bills, and Food Food access concerns: yes Religious or spiritual practice: yes Medication Concerns: no  Services Currently in place:  N/A  Coping/ Adjustment to diagnosis: Patient understands treatment plan and what happens next? 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 Patient enjoys  N/A Current coping skills/ strengths: Active sense of humor , Average or above average intelligence , Capable of independent living , Motivation for treatment/growth , Supportive family/friends , and Other: has future goals and her dog which means a lot to her.    SUMMARY: Current SDOH Barriers:  Financial constraints related to no current income,  not able to work due to medical condition and health issues, Transportation, and inability to more out on her own due to lack of income.  Clinical Social Work Clinical Goal(s):  patient will follow up with [facility] contacted Acupuncturist with patient information and concerns* as directed by SW Patient will contact [place] office to check on disability application  Interventions: Discussed common feeling and emotions when being diagnosed with cancer, and the importance of support during treatment Informed patient of the support team roles and support services at [facility] Provided CSW contact information and encouraged patient to call with any questions or concerns Referred patient to [facility] and Provided patient with information about medicaid application and food stamp application process as well as [employer] DSS contact information   Follow Up Plan: Patient will contact CSW with any support or resource needs Patient verbalizes understanding of plan: Yes    [place] , LCSW

## 2021-08-30 NOTE — Patient Instructions (Signed)
[facility] AT [place]   Discharge Instructions: Thank you for choosing [facility] to provide your oncology and hematology care.  If you have a lab appointment with the [facility], please go directly to the [facility] and check in at the registration area.   Wear comfortable clothing and clothing appropriate for easy access to any Portacath or PICC line.   We strive to give you quality time with your provider. You may need to reschedule your appointment if you arrive late (15 or more minutes).  Arriving late affects you and other patients whose appointments are after yours.  Also, if you miss three or more appointments without notifying the office, you may be dismissed from the clinic at the providers discretion.      For prescription refill requests, have your pharmacy contact our office and allow 72 hours for refills to be completed.    Today you received the following chemotherapy and/or immunotherapy agents: Etoposide.      To help prevent nausea and vomiting after your treatment, we encourage you to take your nausea medication as directed.  BELOW ARE SYMPTOMS THAT SHOULD BE REPORTED IMMEDIATELY: *FEVER GREATER THAN 100.4 F (38 C) OR HIGHER *CHILLS OR SWEATING *NAUSEA AND VOMITING THAT IS NOT CONTROLLED WITH YOUR NAUSEA MEDICATION *UNUSUAL SHORTNESS OF BREATH *UNUSUAL BRUISING OR BLEEDING *URINARY PROBLEMS (pain or burning when urinating, or frequent urination) *BOWEL PROBLEMS (unusual diarrhea, constipation, pain near the anus) TENDERNESS IN MOUTH AND THROAT WITH OR WITHOUT PRESENCE OF ULCERS (sore throat, sores in mouth, or a toothache) UNUSUAL RASH, SWELLING OR PAIN  UNUSUAL VAGINAL DISCHARGE OR ITCHING   Items with * indicate a potential emergency and should be followed up as soon as possible or go to the Emergency Department if any problems should occur.  Please show the CHEMOTHERAPY ALERT CARD or IMMUNOTHERAPY ALERT CARD at check-in  to the Emergency Department and triage nurse.  Should you have questions after your visit or need to cancel or reschedule your appointment, please contact [facility] [place]  Dept: [phone]  and follow the prompts.  Office hours are 8:00 a.m. to 4:30 p.m. Monday - Friday. Please note that voicemails left after 4:00 p.m. may not be returned until the following business day.  We are closed weekends and major holidays. You have access to a nurse at all times for urgent questions. Please call the main number to the clinic Dept: [phone] and follow the prompts.  For any non-urgent questions, you may also contact your provider using MyChart. We now offer e-Visits for anyone 35 and older to request care online for non-urgent symptoms. For details visit [email or web address].   Also download the MyChart app! Go to the app store, search "MyChart", open the app, select [facility], and log in with your MyChart username and password.  Due to Covid, a mask is required upon entering the hospital/clinic. If you do not have a mask, one will be given to you upon arrival. For doctor visits, patients may have 1 support person aged 74 or older with them. For treatment visits, patients cannot have anyone with them due to current Covid guidelines and our immunocompromised population.

## 2021-08-31 ENCOUNTER — Encounter: Payer: Self-pay | Admitting: Oncology

## 2021-09-01 ENCOUNTER — Other Ambulatory Visit: Payer: Self-pay | Admitting: Oncology

## 2021-09-04 ENCOUNTER — Encounter: Payer: Self-pay | Admitting: Oncology

## 2021-09-04 NOTE — Telephone Encounter (Signed)
Component Ref Range & Units 7 d ago  (08/28/21) 4 wk ago  (08/06/21) 1 mo ago  (07/16/21) 2 mo ago  (07/05/21) 2 mo ago  (06/25/21) 2 mo ago  (06/14/21) 3 mo ago  (06/04/21)  Potassium 3.5 - 5.1 mmol/L 3.4 Low   3.6  4.3  3.5  4.0

## 2021-09-05 ENCOUNTER — Encounter: Payer: Self-pay | Admitting: Oncology

## 2021-09-12 ENCOUNTER — Other Ambulatory Visit: Payer: Self-pay

## 2021-09-12 ENCOUNTER — Ambulatory Visit
Admission: RE | Admit: 2021-09-12 | Discharge: 2021-09-12 | Disposition: A | Discharge status: Home / Self Care | Payer: Medicaid Other | Source: Ambulatory Visit | Attending: Oncology | Admitting: Oncology

## 2021-09-12 DIAGNOSIS — C3490 Malignant neoplasm of unspecified part of unspecified bronchus or lung: Secondary | ICD-10-CM | POA: Insufficient documentation

## 2021-09-12 MED ORDER — IOHEXOL 300 MG/ML  SOLN
100.0000 mL | Freq: Once | INTRAMUSCULAR | Status: AC | PRN
  Administered 2021-09-12: 100 mL via INTRAVENOUS

## 2021-09-14 ENCOUNTER — Encounter: Payer: Self-pay | Admitting: Oncology

## 2021-09-18 ENCOUNTER — Inpatient Hospital Stay: Payer: Medicaid Other | Attending: Oncology | Admitting: Oncology

## 2021-09-18 ENCOUNTER — Other Ambulatory Visit: Payer: Self-pay

## 2021-09-18 ENCOUNTER — Inpatient Hospital Stay: Payer: Medicaid Other

## 2021-09-18 ENCOUNTER — Encounter: Payer: Self-pay | Admitting: Oncology

## 2021-09-18 VITALS — BP 127/79 | HR 91 | Temp 97.6°F | Wt 146.9 lb

## 2021-09-18 DIAGNOSIS — M21371 Foot drop, right foot: Secondary | ICD-10-CM | POA: Insufficient documentation

## 2021-09-18 DIAGNOSIS — C7931 Secondary malignant neoplasm of brain: Secondary | ICD-10-CM

## 2021-09-18 DIAGNOSIS — Z8042 Family history of malignant neoplasm of prostate: Secondary | ICD-10-CM | POA: Insufficient documentation

## 2021-09-18 DIAGNOSIS — E876 Hypokalemia: Secondary | ICD-10-CM | POA: Diagnosis not present

## 2021-09-18 DIAGNOSIS — T451X5D Adverse effect of antineoplastic and immunosuppressive drugs, subsequent encounter: Secondary | ICD-10-CM

## 2021-09-18 DIAGNOSIS — G939 Disorder of brain, unspecified: Secondary | ICD-10-CM

## 2021-09-18 DIAGNOSIS — D6481 Anemia due to antineoplastic chemotherapy: Secondary | ICD-10-CM | POA: Diagnosis not present

## 2021-09-18 DIAGNOSIS — C3490 Malignant neoplasm of unspecified part of unspecified bronchus or lung: Secondary | ICD-10-CM | POA: Diagnosis present

## 2021-09-18 DIAGNOSIS — Z5112 Encounter for antineoplastic immunotherapy: Secondary | ICD-10-CM | POA: Insufficient documentation

## 2021-09-18 DIAGNOSIS — R569 Unspecified convulsions: Secondary | ICD-10-CM | POA: Diagnosis not present

## 2021-09-18 DIAGNOSIS — Z5111 Encounter for antineoplastic chemotherapy: Secondary | ICD-10-CM | POA: Diagnosis not present

## 2021-09-18 DIAGNOSIS — F17210 Nicotine dependence, cigarettes, uncomplicated: Secondary | ICD-10-CM

## 2021-09-18 LAB — COMPREHENSIVE METABOLIC PANEL
ALT: 16 U/L (ref 0–44)
AST: 17 U/L (ref 15–41)
Albumin: 3.8 g/dL (ref 3.5–5.0)
Alkaline Phosphatase: 71 U/L (ref 38–126)
Anion gap: 10 (ref 5–15)
BUN: 8 mg/dL (ref 6–20)
CO2: 27 mmol/L (ref 22–32)
Calcium: 9.3 mg/dL (ref 8.9–10.3)
Chloride: 100 mmol/L (ref 98–111)
Creatinine, Ser: 0.34 mg/dL — ABNORMAL LOW (ref 0.44–1.00)
GFR, Estimated: >60 mL/min (ref >60)
Glucose, Bld: 98 mg/dL (ref 70–99)
Potassium: 3.7 mmol/L (ref 3.5–5.1)
Sodium: 137 mmol/L (ref 135–145)
Total Bilirubin: 0.4 mg/dL (ref 0.3–1.2)
Total Protein: 7.3 g/dL (ref 6.5–8.1)

## 2021-09-18 LAB — CBC WITH DIFFERENTIAL/PLATELET
Abs Immature Granulocytes: 0.01 10*3/uL (ref 0.00–0.07)
Basophils Absolute: 0 10*3/uL (ref 0.0–0.1)
Basophils Relative: 0 %
Eosinophils Absolute: 0 10*3/uL (ref 0.0–0.5)
Eosinophils Relative: 0 %
HCT: 37.2 % (ref 36.0–46.0)
Hemoglobin: 11.9 g/dL — ABNORMAL LOW (ref 12.0–15.0)
Immature Granulocytes: 0 %
Lymphocytes Relative: 36 %
Lymphs Abs: 1.7 10*3/uL (ref 0.7–4.0)
MCH: 27.7 pg (ref 26.0–34.0)
MCHC: 32 g/dL (ref 30.0–36.0)
MCV: 86.5 fL (ref 80.0–100.0)
Monocytes Absolute: 0.5 10*3/uL (ref 0.1–1.0)
Monocytes Relative: 11 %
Neutro Abs: 2.4 10*3/uL (ref 1.7–7.7)
Neutrophils Relative %: 53 %
Platelets: 283 10*3/uL (ref 150–400)
RBC: 4.3 MIL/uL (ref 3.87–5.11)
RDW: 20 % — ABNORMAL HIGH (ref 11.5–15.5)
WBC: 4.6 10*3/uL (ref 4.0–10.5)
nRBC: 0 % (ref 0.0–0.2)

## 2021-09-18 MED ORDER — SODIUM CHLORIDE 0.9 % IV SOLN
1200.0000 mg | Freq: Once | INTRAVENOUS | Status: AC
  Administered 2021-09-18: 1200 mg via INTRAVENOUS
  Filled 2021-09-18: qty 20

## 2021-09-18 MED ORDER — SODIUM CHLORIDE 0.9 % IV SOLN
100.0000 mg/m2 | Freq: Once | INTRAVENOUS | Status: AC
  Administered 2021-09-18: 170 mg via INTRAVENOUS
  Filled 2021-09-18: qty 8.5

## 2021-09-18 MED ORDER — SODIUM CHLORIDE 0.9 % IV SOLN
Freq: Once | INTRAVENOUS | Status: AC
  Filled 2021-09-18: qty 250

## 2021-09-18 MED ORDER — SODIUM CHLORIDE 0.9 % IV SOLN
530.0000 mg | Freq: Once | INTRAVENOUS | Status: AC
  Administered 2021-09-18: 530 mg via INTRAVENOUS
  Filled 2021-09-18: qty 53

## 2021-09-18 MED ORDER — PALONOSETRON HCL INJECTION 0.25 MG/5ML
0.2500 mg | Freq: Once | INTRAVENOUS | Status: AC
  Administered 2021-09-18: 0.25 mg via INTRAVENOUS
  Filled 2021-09-18: qty 5

## 2021-09-18 MED ORDER — SODIUM CHLORIDE 0.9 % IV SOLN
150.0000 mg | Freq: Once | INTRAVENOUS | Status: AC
  Administered 2021-09-18: 150 mg via INTRAVENOUS
  Filled 2021-09-18: qty 150

## 2021-09-18 MED ORDER — HEPARIN SOD (PORK) LOCK FLUSH 100 UNIT/ML IV SOLN
500.0000 [IU] | Freq: Once | INTRAVENOUS | Status: AC | PRN
  Administered 2021-09-18: 500 [IU]
  Filled 2021-09-18: qty 5

## 2021-09-18 MED ORDER — SODIUM CHLORIDE 0.9 % IV SOLN
10.0000 mg | Freq: Once | INTRAVENOUS | Status: AC
  Administered 2021-09-18: 10 mg via INTRAVENOUS
  Filled 2021-09-18: qty 10

## 2021-09-18 NOTE — Progress Notes (Signed)
Hematology/Oncology progress note [phone] [phone]   Patient Care Team: Delsa Grana, PA-C as PCP - General (Family Medicine) Telford Nab, RN as Oncology Nurse Navigator  REFERRING PROVIDER: Delsa Grana, PA-C  CHIEF COMPLAINTS/REASON FOR VISIT:  small cell lung cancer  HISTORY OF PRESENTING ILLNESS:   Jenna Newton is a  56 y.o.  female with PMH listed below was seen in consultation at the request of  Delsa Grana, PA-C  for evaluation of lung mass  05/08/2021, patient presented to emergency room for evaluation of expressive aphasia.  Acute onset of symptoms.  She/has no other neurological deficits at that time.  In ER, patient was found to have expressive aphasia with intermittent word at rest, though receptive and follows commands. MRI brain with and without contrast showed multiple small peripherally enhancing lesions.  Primary differential consideration is metastatic disease.   Patient declined being admitted.  Was eventually convinced to stay to do CT chest abdomen pelvis with contrast. CT showed a 3.2 cm posterior left upper lobe mass extending from the left hilum, concerning for primary bronchogenic carcinoma.  Enlarged left hilar and prominent bilateral peritracheal lymph nodes are identified.  Worrisome for nodal metastasis.  Similar appearance of bilateral adrenal nodule compared with 12/17/2013.  Favor benign adenomas.  Coronary artery calcifications, enlarged fibroid uterus. Patient prefers to be discharged home.  She was sent on dexamethasone 4 mg twice daily.  And Keppra 500 mg twice daily.  Patient was referred to establish care with cancer center. Today she has no difficulty in speech.  She was accompanied by her cousin. She has some gait change with right foot drop. No additional episodes of expressive aphasia.  Patient drinks alcohol, 12 packs of beer /week, current every day smoker.   05/25/2021 left upper lobe biopsy via bronchoscopy  showed small cell lung cancer. left upper lobe brushing/ LUB lavage + small cell lung cancer,  Precarinal lymph node biopsy- not enough tissue  Patient has baseline alopecia 05/30/2021- 06/13/2021 palliative brain RT.  06/04/2021 palliative chemotherapy treatments with carboplatin etoposide 06/25/2021, cycle 2 carboplatin etoposide and Tecentriq. Continued on carboplatin etoposide Tecentriq. Interim PET scan after 4 cycles of chemo was not approved by her insurance.  CT scan was obtained after 5 cycles of chemotherapy.  INTERVAL HISTORY Jenna Newton is a 56 y.o. female who has above history reviewed by me today presents for follow up visit for management of extensive small cell lung cancer Status post 5 cycles of carboplatin/etoposide. Overall she tolerates well.  Denies any nausea vomiting diarrhea fever or chills Patient takes Keppra 500 mg twice daily.   Review of Systems  Constitutional:  Positive for fatigue. Negative for appetite change, chills and fever.  HENT:   Negative for hearing loss and voice change.        Alopecia-chronic  Eyes:  Negative for eye problems.  Respiratory:  Negative for chest tightness and cough.   Cardiovascular:  Negative for chest pain.  Gastrointestinal:  Negative for abdominal distention, abdominal pain and blood in stool.  Endocrine: Negative for hot flashes.  Genitourinary:  Negative for difficulty urinating and frequency.   Musculoskeletal:  Negative for arthralgias and gait problem.  Skin:  Negative for itching and rash.  Neurological:  Negative for extremity weakness, gait problem, headaches, seizures and speech difficulty.  Hematological:  Negative for adenopathy.  Psychiatric/Behavioral:  Negative for confusion.    MEDICAL HISTORY:  Past Medical History:  Diagnosis Date   Anemia    GERD (gastroesophageal reflux disease)  Hypertension    Neuropathy    Tobacco abuse 03/14/2017   Vertigo    White matter disease of brain due to ischemia  12/04/2017   Head CT March 2019    SURGICAL HISTORY: Past Surgical History:  Procedure Laterality Date   none     PORTA CATH INSERTION N/A 06/18/2021   Procedure: PORTA CATH INSERTION;  Surgeon: Algernon Huxley, MD;  Location: [facility];  Service: Cardiovascular;  Laterality: N/A;   VIDEO BRONCHOSCOPY WITH ENDOBRONCHIAL NAVIGATION N/A 05/25/2021   Procedure: ROBOTIC ASSISTED VIDEO BRONCHOSCOPY WITH ENDOBRONCHIAL NAVIGATION;  Surgeon: Tyler Pita, MD;  Location: [facility] OR[facility];  Service: Pulmonary;  Laterality: N/A;   VIDEO BRONCHOSCOPY WITH ENDOBRONCHIAL ULTRASOUND N/A 05/25/2021   Procedure: VIDEO BRONCHOSCOPY WITH ENDOBRONCHIAL ULTRASOUND;  Surgeon: Tyler Pita, MD;  Location: [facility] OR[facility];  Service: Pulmonary;  Laterality: N/A;    SOCIAL HISTORY: Social History   Socioeconomic History   Marital status: Single    Spouse name: Not on file   Number of children: 0   Years of education: Not on file   Highest education level: Bachelor's degree (e.g., BA, AB, BS)  Occupational History   Not on file  Tobacco Use   Smoking status: Every Day    Packs/day: 1.00    Years: 25.00    Pack years: 25.00    Types: Cigarettes   Smokeless tobacco: Never  Vaping Use   Vaping Use: Never used  Substance and Sexual Activity   Alcohol use: Yes    Alcohol/week: 14.0 standard drinks    Types: 14 Cans of beer per week    Comment: 12 pack/ week   Drug use: No   Sexual activity: Yes    Partners: Male    Birth control/protection: Post-menopausal  Other Topics Concern   Not on file  Social History Narrative   Lives with brother   Social Determinants of Health   Financial Resource Strain: Not on file  Food Insecurity: Food Insecurity Present   Worried About Charity fundraiser in the Last Year: Sometimes true   Arboriculturist in the Last Year: Sometimes true  Transportation Needs: Not on file  Physical Activity: Not on file  Stress: Not on file  Social Connections: Not on file   Intimate Partner Violence: Not on file    FAMILY HISTORY: Family History  Problem Relation Age of Onset   Diabetes Mother    Hypertension Mother    Emphysema Mother    Emphysema Father    Diabetes Brother    Alcohol abuse Brother    Diabetes Maternal Grandmother    Hypertension Maternal Grandmother    Blindness Maternal Grandmother    Cancer Maternal Grandfather        prostate   Emphysema Maternal Grandfather    Cancer Paternal Grandmother    Emphysema Paternal Grandfather     ALLERGIES:  is allergic to penicillins, meclizine, and sulfa antibiotics.  MEDICATIONS:  Current Outpatient Medications  Medication Sig Dispense Refill   amLODipine (NORVASC) 5 MG tablet Take 1 tablet (5 mg total) by mouth daily. 90 tablet 3   atorvastatin (LIPITOR) 40 MG tablet Take 40 mg by mouth at bedtime. 1 Tablet(s) By Mouth Every Night     cromolyn (OPTICROM) 4 % ophthalmic solution Place 1 drop into both eyes 4 (four) times daily as needed. 10 mL 3   dexamethasone (DECADRON) 1 MG tablet Take 1 tablet (1 mg total) by mouth See admin instructions. Take  3mg  daily for 1 week and then take 2mg  daily for 1 week. 35 tablet 0   dexamethasone (DECADRON) 1 MG tablet Take 1 tablet (1 mg total) by mouth daily. 14 tablet 0   fluticasone (FLONASE) 50 MCG/ACT nasal spray Place 1 spray into both nostrils daily as needed for allergies.     hydrocortisone cream 0.5 % Apply 1 application topically 2 (two) times daily as needed for itching. 30 g 0   levETIRAcetam (KEPPRA) 500 MG tablet TAKE 1 TABLET BY MOUTH TWICE A DAY 120 tablet 0   lidocaine-prilocaine (EMLA) cream Apply 1 application topically as needed. 30 g 3   montelukast (SINGULAIR) 10 MG tablet Take 1 tablet (10 mg total) by mouth at bedtime. 90 tablet 3   ondansetron (ZOFRAN) 8 MG tablet Take 1 tablet (8 mg total) by mouth 2 (two) times daily as needed for refractory nausea / vomiting. Start on day 3 after carboplatin chemo. 30 tablet 1   potassium  chloride SA (KLOR-CON M) 20 MEQ tablet Take 1 tablet (20 mEq total) by mouth daily. 30 tablet 1   prochlorperazine (COMPAZINE) 10 MG tablet Take 1 tablet (10 mg total) by mouth every 6 (six) hours as needed (Nausea or vomiting). 30 tablet 1   saline (AYR) GEL Place 1 application into the nose every 4 (four) hours. 14.1 g 3   Tiotropium Bromide-Olodaterol (STIOLTO RESPIMAT) 2.5-2.5 MCG/ACT AERS Inhale 2 puffs into the lungs daily. 1 each 0   vitamin B-12 (CYANOCOBALAMIN) 500 MCG tablet Take 1 tablet (500 mcg total) by mouth daily. 100 tablet 3   No current facility-administered medications for this visit.     PHYSICAL EXAMINATION: ECOG PERFORMANCE STATUS: 1 - Symptomatic but completely ambulatory Vitals:   09/18/21 0828  BP: 127/79  Pulse: 91  Temp: 97.6 F (36.4 C)   Filed Weights   09/18/21 0828  Weight: 146 lb 14.4 oz (66.6 kg)      Physical Exam Constitutional:      General: She is not in acute distress. HENT:     Head: Normocephalic and atraumatic.     Comments: Alopecia Eyes:     General: No scleral icterus. Cardiovascular:     Rate and Rhythm: Normal rate and regular rhythm.     Heart sounds: Normal heart sounds.  Pulmonary:     Effort: Pulmonary effort is normal. No respiratory distress.     Breath sounds: No wheezing.  Abdominal:     General: Bowel sounds are normal. There is no distension.     Palpations: Abdomen is soft.  Musculoskeletal:        General: No deformity. Normal range of motion.     Cervical back: Normal range of motion and neck supple.  Skin:    General: Skin is warm and dry.     Findings: No erythema or rash.  Neurological:     Mental Status: She is alert and oriented to person, place, and time. Mental status is at baseline.     Cranial Nerves: No cranial nerve deficit.     Coordination: Coordination normal.     Comments: Gait has improved.   Psychiatric:        Mood and Affect: Mood normal.    LABORATORY DATA:  I have reviewed the  data as listed Lab Results  Component Value Date   WBC 4.6 09/18/2021   HGB 11.9 (L) 09/18/2021   HCT 37.2 09/18/2021   MCV 86.5 09/18/2021   PLT 283 09/18/2021  Recent Labs    08/06/21 0905 08/28/21 0845 09/18/21 0806  NA 136 135 137  K 3.6 3.4* 3.7  CL 99 99 100  CO2 27 28 27   GLUCOSE 95 98 98  BUN 7 8 8   CREATININE 0.34* 0.35* 0.34*  CALCIUM 9.2 9.2 9.3  GFRNONAA >60 >60 >60  PROT 7.4 7.5 7.3  ALBUMIN 4.1 3.9 3.8  AST 17 16 17   ALT 14 15 16   ALKPHOS 64 70 71  BILITOT 0.4 0.4 0.4    Iron/TIBC/Ferritin/ %Sat    Component Value Date/Time   FERRITIN 126 03/14/2017 0926       RADIOGRAPHIC STUDIES: I have personally reviewed the radiological images as listed and agreed with the findings in the report. CT CHEST ABDOMEN PELVIS W CONTRAST  Result Date: 09/12/2021 CLINICAL DATA:  Metastatic small-cell lung cancer restaging, ongoing chemotherapy and immunotherapy EXAM: CT CHEST, ABDOMEN, AND PELVIS WITH CONTRAST TECHNIQUE: Multidetector CT imaging of the chest, abdomen and pelvis was performed following the standard protocol during bolus administration of intravenous contrast. RADIATION DOSE REDUCTION: This exam was performed according to the departmental dose-optimization program which includes automated exposure control, adjustment of the mA and/or kV according to patient size and/or use of iterative reconstruction technique. CONTRAST:  138mL OMNIPAQUE IOHEXOL 300 MG/ML SOLN, additional oral enteric contrast COMPARISON:  CT chest, 05/24/2021, CT chest abdomen pelvis, 05/08/2021 FINDINGS: CT CHEST FINDINGS Cardiovascular: Right chest port catheter. Aortic atherosclerosis. Normal heart size. Three-vessel coronary artery calcifications. No pericardial effusion. Mediastinum/Nodes: Unchanged prominent pretracheal and left hilar lymph nodes, pretracheal node measuring up to 1.5 x 1.3 cm (series 2, image 25). Thymic remnant in the anterior mediastinum. Thyroid gland, trachea, and  esophagus demonstrate no significant findings. Lungs/Pleura: Moderate centrilobular emphysema. Interval decrease in size of an elongated, somewhat tubular nodule in the posterior left upper lobe, now measuring 2.7 x 0.9 cm in the sagittal plane, previously 3.2 x 1.2 cm (series 6, image 119). Unchanged, bandlike scarring of the lateral segment right middle lobe (series 3, image 96). No pleural effusion or pneumothorax. Musculoskeletal: No chest wall mass or suspicious osseous lesions identified. CT ABDOMEN PELVIS FINDINGS Hepatobiliary: No solid liver abnormality is seen. No gallstones, gallbladder wall thickening, or biliary dilatation. Pancreas: Unremarkable. No pancreatic ductal dilatation or surrounding inflammatory changes. Spleen: Normal in size without significant abnormality. Adrenals/Urinary Tract: Interval enlargement of lateral limb left adrenal nodule, measuring 1.9 x 1.6 cm, previously 1.4 x 0.7 cm (series 2, image 65). Unchanged nodule of the right adrenal body measuring 2.3 x 2.0 cm (series 2, image 65). Kidneys are normal, without renal calculi, solid lesion, or hydronephrosis. Bladder is unremarkable. Stomach/Bowel: Stomach is within normal limits. Appendix appears normal. No evidence of bowel wall thickening, distention, or inflammatory changes. Occasional sigmoid diverticula. Vascular/Lymphatic: Aortic atherosclerosis. No enlarged abdominal or pelvic lymph nodes. Reproductive: Bulky, calcified uterine fibroids. Other: No abdominal wall hernia or abnormality. No ascites. Musculoskeletal: No acute osseous findings. IMPRESSION: 1. Interval decrease in size of an elongated, somewhat tubular nodule in the posterior left upper lobe, consistent with treatment response. 2. Unchanged prominent pretracheal and left hilar lymph nodes, which remain worrisome for nodal metastases. Continued attention on follow-up. 3. Interval enlargement of lateral limb left adrenal nodule, consistent with a new or enlarged  adrenal metastasis. Unchanged nodule of the right adrenal body, stable long-term and consistent with a benign adenoma. 4. No evidence of new metastatic disease in the chest, abdomen, or pelvis. 5. Emphysema. 6. Coronary artery disease. 7. Uterine fibroids. Aortic  Atherosclerosis (ICD10-I70.0) and Emphysema (ICD10-J43.9). Electronically Signed   By: Delanna Ahmadi M.D.   On: 09/12/2021 14:57      ASSESSMENT & PLAN:  1. Encounter for antineoplastic chemotherapy   2. Encounter for antineoplastic immunotherapy   3. Small cell lung cancer ([place])   4. Antineoplastic chemotherapy induced anemia   5. Seizure ([place])   6. Brain lesion    Cancer Staging  Small cell lung cancer [facility]) Staging form: Lung, AJCC 8th Edition - Clinical stage from 06/01/2021: Stage IV (cT2a, cN3, cM1) - Signed by Earlie Server, MD on 06/01/2021  # Extensive small cell cancer, brain metastatic disease S/p 1 cycle of carboplatin + etoposide, and is post palliative brain radiation. 09/12/2021 CT chest abdomen pelvis with contrast was reviewed and discussed with patient Mixed response. Decrease of posterior left upper lobe mass, unchanged pretracheal and left hilar lymph nodes.  Interval enlargement of lateral limb left adrenal nodule, Unchanged right adrenal body.  No evidence of new metastasis in the chest abdomen pelvis.  I recommend patient to continue current chemotherapy regimen.  Proceed with cycle 6 carboplatin etoposide.  Short-term follow-up after cycle 7 chemotherapy treatments. I will check with Dr. Baruch Gouty to see if adrenal radiation is feasible.  Lymphadenopathy has not responded very well to the treatments.  She may also need to have chest radiation.   # Brain metastasis and seizure S/p RT, s/p steroid taper.  Continue Keppra 500mg  BID. Off dexamethasone now.  #Hypokalemia, continue potassium chloride 13meq daily.  Follow up 3 weeks lab MD carboplatin/Etoposide/tecentriq and also review CT scan results.   All  questions were answered. The patient knows to call the clinic with any problems questions or concerns.  cc Delsa Grana, PA-C    Earlie Server, MD, PhD  09/18/2021

## 2021-09-18 NOTE — Patient Instructions (Signed)
[facility]  Discharge Instructions: ?Thank you for choosing [facility] to provide your oncology and hematology care.  ?If you have a lab appointment with the Cancer Center, please go directly to the Cancer Center and check in at the registration area. ? ?Wear comfortable clothing and clothing appropriate for easy access to any Portacath or PICC line.  ? ?We strive to give you quality time with your provider. You may need to reschedule your appointment if you arrive late (15 or more minutes).  Arriving late affects you and other patients whose appointments are after yours.  Also, if you miss three or more appointments without notifying the office, you may be dismissed from the clinic at the provider?s discretion.    ?  ?For prescription refill requests, have your pharmacy contact our office and allow 72 hours for refills to be completed.   ? ?  ?To help prevent nausea and vomiting after your treatment, we encourage you to take your nausea medication as directed. ? ?BELOW ARE SYMPTOMS THAT SHOULD BE REPORTED IMMEDIATELY: ?*FEVER GREATER THAN 100.4 F (38 ?C) OR HIGHER ?*CHILLS OR SWEATING ?*NAUSEA AND VOMITING THAT IS NOT CONTROLLED WITH YOUR NAUSEA MEDICATION ?*UNUSUAL SHORTNESS OF BREATH ?*UNUSUAL BRUISING OR BLEEDING ?*URINARY PROBLEMS (pain or burning when urinating, or frequent urination) ?*BOWEL PROBLEMS (unusual diarrhea, constipation, pain near the anus) ?TENDERNESS IN MOUTH AND THROAT WITH OR WITHOUT PRESENCE OF ULCERS (sore throat, sores in mouth, or a toothache) ?UNUSUAL RASH, SWELLING OR PAIN  ?UNUSUAL VAGINAL DISCHARGE OR ITCHING  ? ?Items with * indicate a potential emergency and should be followed up as soon as possible or go to the Emergency Department if any problems should occur. ? ?Please show the CHEMOTHERAPY ALERT CARD or IMMUNOTHERAPY ALERT CARD at check-in to the Emergency Department and triage nurse. ? ?Should you have questions after your visit  or need to cancel or reschedule your appointment, please contact [facility] [place]  [phone] and follow the prompts.  Office hours are 8:00 a.m. to 4:30 p.m. Monday - Friday. Please note that voicemails left after 4:00 p.m. may not be returned until the following business day.  We are closed weekends and major holidays. You have access to a nurse at all times for urgent questions. Please call the main number to the clinic [phone] and follow the prompts. ? ?For any non-urgent questions, you may also contact your provider using MyChart. We now offer e-Visits for anyone 18 and older to request care online for non-urgent symptoms. For details visit [email or web address]. ?  ?Also download the MyChart app! Go to the app store, search "MyChart", open the app, select [facility], and log in with your MyChart username and password. ? ?Due to Covid, a mask is required upon entering the hospital/clinic. If you do not have a mask, one will be given to you upon arrival. For doctor visits, patients may have 1 support person aged 18 or older with them. For treatment visits, patients cannot have anyone with them due to current Covid guidelines and our immunocompromised population.  ?

## 2021-09-19 ENCOUNTER — Inpatient Hospital Stay: Payer: Medicaid Other

## 2021-09-19 ENCOUNTER — Ambulatory Visit: Payer: BC Managed Care – PPO

## 2021-09-19 ENCOUNTER — Other Ambulatory Visit: Payer: Self-pay | Admitting: Oncology

## 2021-09-19 VITALS — BP 121/63 | HR 102 | Temp 98.9°F | Resp 18

## 2021-09-19 DIAGNOSIS — C3490 Malignant neoplasm of unspecified part of unspecified bronchus or lung: Secondary | ICD-10-CM

## 2021-09-19 DIAGNOSIS — Z5112 Encounter for antineoplastic immunotherapy: Secondary | ICD-10-CM | POA: Diagnosis not present

## 2021-09-19 MED ORDER — HEPARIN SOD (PORK) LOCK FLUSH 100 UNIT/ML IV SOLN
500.0000 [IU] | Freq: Once | INTRAVENOUS | Status: AC | PRN
  Administered 2021-09-19: 500 [IU]
  Filled 2021-09-19: qty 5

## 2021-09-19 MED ORDER — SODIUM CHLORIDE 0.9 % IV SOLN
100.0000 mg/m2 | Freq: Once | INTRAVENOUS | Status: AC
  Administered 2021-09-19: 170 mg via INTRAVENOUS
  Filled 2021-09-19: qty 8.5

## 2021-09-19 MED ORDER — SODIUM CHLORIDE 0.9 % IV SOLN
Freq: Once | INTRAVENOUS | Status: AC
  Filled 2021-09-19: qty 250

## 2021-09-19 MED ORDER — SODIUM CHLORIDE 0.9% FLUSH
10.0000 mL | INTRAVENOUS | Status: DC | PRN
  Administered 2021-09-19: 10 mL
  Filled 2021-09-19: qty 10

## 2021-09-19 MED ORDER — SODIUM CHLORIDE 0.9 % IV SOLN
10.0000 mg | Freq: Once | INTRAVENOUS | Status: AC
  Administered 2021-09-19: 10 mg via INTRAVENOUS
  Filled 2021-09-19: qty 10

## 2021-09-19 NOTE — Progress Notes (Signed)
Nutrition  Patient requesting samples of ensure (likes chocolate).  Samples given along with coupons.   Gregary Blackard B. Zenia Resides, [place], [place] Registered Dietitian [phone] (mobile)

## 2021-09-19 NOTE — Patient Instructions (Signed)
[facility] AT [place]  Discharge Instructions: Thank you for choosing [facility] to provide your oncology and hematology care.  If you have a lab appointment with the [facility], please go directly to the [facility] and check in at the registration area.  Wear comfortable clothing and clothing appropriate for easy access to any Portacath or PICC line.   We strive to give you quality time with your provider. You may need to reschedule your appointment if you arrive late (15 or more minutes).  Arriving late affects you and other patients whose appointments are after yours.  Also, if you miss three or more appointments without notifying the office, you may be dismissed from the clinic at the providers discretion.      For prescription refill requests, have your pharmacy contact our office and allow 72 hours for refills to be completed.    Today you received the following chemotherapy and/or immunotherapy agents: Etoposide      To help prevent nausea and vomiting after your treatment, we encourage you to take your nausea medication as directed.  BELOW ARE SYMPTOMS THAT SHOULD BE REPORTED IMMEDIATELY: *FEVER GREATER THAN 100.4 F (38 C) OR HIGHER *CHILLS OR SWEATING *NAUSEA AND VOMITING THAT IS NOT CONTROLLED WITH YOUR NAUSEA MEDICATION *UNUSUAL SHORTNESS OF BREATH *UNUSUAL BRUISING OR BLEEDING *URINARY PROBLEMS (pain or burning when urinating, or frequent urination) *BOWEL PROBLEMS (unusual diarrhea, constipation, pain near the anus) TENDERNESS IN MOUTH AND THROAT WITH OR WITHOUT PRESENCE OF ULCERS (sore throat, sores in mouth, or a toothache) UNUSUAL RASH, SWELLING OR PAIN  UNUSUAL VAGINAL DISCHARGE OR ITCHING   Items with * indicate a potential emergency and should be followed up as soon as possible or go to the Emergency Department if any problems should occur.  Please show the CHEMOTHERAPY ALERT CARD or IMMUNOTHERAPY ALERT CARD at check-in to  the Emergency Department and triage nurse.  Should you have questions after your visit or need to cancel or reschedule your appointment, please contact [facility] [place] AT [place]  [phone] and follow the prompts.  Office hours are 8:00 a.m. to 4:30 p.m. Monday - Friday. Please note that voicemails left after 4:00 p.m. may not be returned until the following business day.  We are closed weekends and major holidays. You have access to a nurse at all times for urgent questions. Please call the main number to the clinic [phone] and follow the prompts.  For any non-urgent questions, you may also contact your provider using MyChart. We now offer e-Visits for anyone 73 and older to request care online for non-urgent symptoms. For details visit [email or web address].   Also download the MyChart app! Go to the app store, search "MyChart", open the app, select [facility], and log in with your MyChart username and password.  Due to Covid, a mask is required upon entering the hospital/clinic. If you do not have a mask, one will be given to you upon arrival. For doctor visits, patients may have 1 support person aged 3 or older with them. For treatment visits, patients cannot have anyone with them due to current Covid guidelines and our immunocompromised population. Etoposide, VP-16 capsules What is this medication? ETOPOSIDE, VP-16 (e toe POE side) is a chemotherapy drug. It is used to treat small cell lung cancer and other cancers. This medicine may be used for other purposes; ask your health care provider or pharmacist if you have questions. COMMON BRAND NAME(S): VePesid What should I tell my  care team before I take this medication? They need to know if you have any of these conditions: infection kidney disease liver disease low blood counts, like low white cell, platelet, or red cell counts an unusual or allergic reaction to etoposide, other medicines, foods, dyes, or  preservatives pregnant or trying to get pregnant breast-feeding How should I use this medication? Take this medicine by mouth with a glass of water. Follow the directions on the prescription label. Do not open, crush, or chew the capsules. It is advisable to wear gloves when handling this medicine. Take your medicine at regular intervals. Do not take it more often than directed. Do not stop taking except on your doctor's advice. Talk to your pediatrician regarding the use of this medicine in children. Special care may be needed. Overdosage: If you think you have taken too much of this medicine contact a poison control center or emergency room at once. NOTE: This medicine is only for you. Do not share this medicine with others. What if I miss a dose? If you miss a dose, take it as soon as you can. If it is almost time for your next dose, take only that dose. Do not take double or extra doses. What may interact with this medication? This medicine may interact with the following medications: cyclosporine warfarin This list may not describe all possible interactions. Give your health care provider a list of all the medicines, herbs, non-prescription drugs, or dietary supplements you use. Also tell them if you smoke, drink alcohol, or use illegal drugs. Some items may interact with your medicine. What should I watch for while using this medication? Visit your doctor for checks on your progress. This drug may make you feel generally unwell. This is not uncommon, as chemotherapy can affect healthy cells as well as cancer cells. Report any side effects. Continue your course of treatment even though you feel ill unless your doctor tells you to stop. In some cases, you may be given additional medicines to help with side effects. Follow all directions for their use. Call your doctor or health care professional for advice if you get a fever, chills or sore throat, or other symptoms of a cold or flu. Do not  treat yourself. This drug decreases your body's ability to fight infections. Try to avoid being around people who are sick. This medicine may increase your risk to bruise or bleed. Call your doctor or health care professional if you notice any unusual bleeding. Talk to your doctor about your risk of cancer. You may be more at risk for certain types of cancers if you take this medicine. Do not become pregnant while taking this medicine or for at least 6 months after stopping it. Women should inform their doctor if they wish to become pregnant or think they might be pregnant. Women of child-bearing potential will need to have a negative pregnancy test before starting this medicine. There is a potential for serious side effects to an unborn child. Talk to your health care professional or pharmacist for more information. Do not breast-feed an infant while taking this medicine. Men must use a latex condom during sexual contact with a woman while taking this medicine and for at least 4 months after stopping it. A latex condom is needed even if you have had a vasectomy. Contact your doctor right away if your partner becomes pregnant. Do not donate sperm while taking this medicine and for 4 months after you stop taking this medicine. Men should  inform their doctors if they wish to father a child. This medicine may lower sperm counts. What side effects may I notice from receiving this medication? Side effects that you should report to your doctor or health care professional as soon as possible: allergic reactions like skin rash, itching or hives, swelling of the face, lips, or tongue low blood counts - this medicine may decrease the number of white blood cells, red blood cells, and platelets. You may be at increased risk for infections and bleeding nausea, vomiting redness, blistering, peeling or loosening of the skin, including inside the mouth signs and symptoms of infection like fever; chills; cough; sore  throat; pain or trouble passing urine signs and symptoms of low red blood cells or anemia such as unusually weak or tired; feeling faint or lightheaded; falls; breathing problems unusual bruising or bleeding Side effects that usually do not require medical attention (report to your doctor or health care professional if they continue or are bothersome): changes in taste diarrhea hair loss loss of appetite mouth sores This list may not describe all possible side effects. Call your doctor for medical advice about side effects. You may report side effects to FDA at 1-800-FDA-1088. Where should I keep my medication? Keep out of the reach of children. Store in a refrigerator between 2 and 8 degrees C (36 and 46 degrees F). Do not freeze. Throw away any unused medicine after the expiration date. NOTE: This sheet is a summary. It may not cover all possible information. If you have questions about this medicine, talk to your doctor, pharmacist, or health care provider.  2022 [employer] (2021-05-08 00:00:00)

## 2021-09-19 NOTE — Telephone Encounter (Signed)
°  Component Ref Range & Units 1 d ago (09/18/21) 3 wk ago (08/28/21) 1 mo ago (08/06/21) 2 mo ago (07/16/21) 2 mo ago (07/05/21) 2 mo ago (06/25/21) 3 mo ago (06/14/21)  Potassium 3.5 - 5.1 mmol/L 3.7  3.4 Low   3.6  4.3

## 2021-09-20 ENCOUNTER — Inpatient Hospital Stay: Payer: Medicaid Other

## 2021-09-20 ENCOUNTER — Ambulatory Visit: Payer: BC Managed Care – PPO

## 2021-09-20 ENCOUNTER — Other Ambulatory Visit: Payer: Self-pay

## 2021-09-20 VITALS — BP 117/90 | HR 86 | Temp 98.5°F | Resp 18

## 2021-09-20 DIAGNOSIS — C3490 Malignant neoplasm of unspecified part of unspecified bronchus or lung: Secondary | ICD-10-CM

## 2021-09-20 DIAGNOSIS — Z5112 Encounter for antineoplastic immunotherapy: Secondary | ICD-10-CM | POA: Diagnosis not present

## 2021-09-20 MED ORDER — SODIUM CHLORIDE 0.9 % IV SOLN
10.0000 mg | Freq: Once | INTRAVENOUS | Status: AC
  Administered 2021-09-20: 10 mg via INTRAVENOUS
  Filled 2021-09-20: qty 10

## 2021-09-20 MED ORDER — HEPARIN SOD (PORK) LOCK FLUSH 100 UNIT/ML IV SOLN
500.0000 [IU] | Freq: Once | INTRAVENOUS | Status: AC | PRN
  Administered 2021-09-20: 500 [IU]
  Filled 2021-09-20: qty 5

## 2021-09-20 MED ORDER — SODIUM CHLORIDE 0.9 % IV SOLN
Freq: Once | INTRAVENOUS | Status: AC
  Filled 2021-09-20: qty 250

## 2021-09-20 MED ORDER — SODIUM CHLORIDE 0.9 % IV SOLN
100.0000 mg/m2 | Freq: Once | INTRAVENOUS | Status: AC
  Administered 2021-09-20: 170 mg via INTRAVENOUS
  Filled 2021-09-20: qty 8.5

## 2021-10-09 ENCOUNTER — Inpatient Hospital Stay (HOSPITAL_BASED_OUTPATIENT_CLINIC_OR_DEPARTMENT_OTHER): Payer: Medicaid Other | Admitting: Oncology

## 2021-10-09 ENCOUNTER — Inpatient Hospital Stay: Payer: Medicaid Other

## 2021-10-09 ENCOUNTER — Encounter: Payer: Self-pay | Admitting: Oncology

## 2021-10-09 ENCOUNTER — Other Ambulatory Visit: Payer: Self-pay

## 2021-10-09 ENCOUNTER — Inpatient Hospital Stay: Payer: Medicaid Other | Attending: Oncology

## 2021-10-09 VITALS — BP 118/74 | HR 93 | Temp 97.4°F | Resp 18 | Wt 148.2 lb

## 2021-10-09 VITALS — BP 124/66 | HR 87

## 2021-10-09 DIAGNOSIS — R569 Unspecified convulsions: Secondary | ICD-10-CM

## 2021-10-09 DIAGNOSIS — E876 Hypokalemia: Secondary | ICD-10-CM | POA: Diagnosis not present

## 2021-10-09 DIAGNOSIS — Z5112 Encounter for antineoplastic immunotherapy: Secondary | ICD-10-CM

## 2021-10-09 DIAGNOSIS — Z5111 Encounter for antineoplastic chemotherapy: Secondary | ICD-10-CM | POA: Insufficient documentation

## 2021-10-09 DIAGNOSIS — D6481 Anemia due to antineoplastic chemotherapy: Secondary | ICD-10-CM

## 2021-10-09 DIAGNOSIS — C3490 Malignant neoplasm of unspecified part of unspecified bronchus or lung: Secondary | ICD-10-CM | POA: Diagnosis present

## 2021-10-09 DIAGNOSIS — C7931 Secondary malignant neoplasm of brain: Secondary | ICD-10-CM | POA: Insufficient documentation

## 2021-10-09 DIAGNOSIS — Z8042 Family history of malignant neoplasm of prostate: Secondary | ICD-10-CM | POA: Diagnosis not present

## 2021-10-09 DIAGNOSIS — T451X5A Adverse effect of antineoplastic and immunosuppressive drugs, initial encounter: Secondary | ICD-10-CM

## 2021-10-09 DIAGNOSIS — F17210 Nicotine dependence, cigarettes, uncomplicated: Secondary | ICD-10-CM | POA: Insufficient documentation

## 2021-10-09 DIAGNOSIS — Z79899 Other long term (current) drug therapy: Secondary | ICD-10-CM | POA: Insufficient documentation

## 2021-10-09 DIAGNOSIS — G939 Disorder of brain, unspecified: Secondary | ICD-10-CM

## 2021-10-09 DIAGNOSIS — M21371 Foot drop, right foot: Secondary | ICD-10-CM | POA: Insufficient documentation

## 2021-10-09 DIAGNOSIS — T451X5D Adverse effect of antineoplastic and immunosuppressive drugs, subsequent encounter: Secondary | ICD-10-CM | POA: Diagnosis not present

## 2021-10-09 LAB — COMPREHENSIVE METABOLIC PANEL
ALT: 16 U/L (ref 0–44)
AST: 17 U/L (ref 15–41)
Albumin: 4 g/dL (ref 3.5–5.0)
Alkaline Phosphatase: 78 U/L (ref 38–126)
Anion gap: 6 (ref 5–15)
BUN: 8 mg/dL (ref 6–20)
CO2: 27 mmol/L (ref 22–32)
Calcium: 9.2 mg/dL (ref 8.9–10.3)
Chloride: 103 mmol/L (ref 98–111)
Creatinine, Ser: 0.4 mg/dL — ABNORMAL LOW (ref 0.44–1.00)
GFR, Estimated: >60 mL/min (ref >60)
Glucose, Bld: 94 mg/dL (ref 70–99)
Potassium: 3.7 mmol/L (ref 3.5–5.1)
Sodium: 136 mmol/L (ref 135–145)
Total Bilirubin: 0.3 mg/dL (ref 0.3–1.2)
Total Protein: 7.6 g/dL (ref 6.5–8.1)

## 2021-10-09 LAB — TSH: TSH: 1.903 u[IU]/mL (ref 0.350–4.500)

## 2021-10-09 LAB — CBC WITH DIFFERENTIAL/PLATELET
Abs Immature Granulocytes: 0 10*3/uL (ref 0.00–0.07)
Basophils Absolute: 0 10*3/uL (ref 0.0–0.1)
Basophils Relative: 1 %
Eosinophils Absolute: 0 10*3/uL (ref 0.0–0.5)
Eosinophils Relative: 1 %
HCT: 38.1 % (ref 36.0–46.0)
Hemoglobin: 12.1 g/dL (ref 12.0–15.0)
Immature Granulocytes: 0 %
Lymphocytes Relative: 37 %
Lymphs Abs: 1.5 10*3/uL (ref 0.7–4.0)
MCH: 27.9 pg (ref 26.0–34.0)
MCHC: 31.8 g/dL (ref 30.0–36.0)
MCV: 88 fL (ref 80.0–100.0)
Monocytes Absolute: 0.5 10*3/uL (ref 0.1–1.0)
Monocytes Relative: 13 %
Neutro Abs: 2 10*3/uL (ref 1.7–7.7)
Neutrophils Relative %: 48 %
Platelets: 282 10*3/uL (ref 150–400)
RBC: 4.33 MIL/uL (ref 3.87–5.11)
RDW: 20 % — ABNORMAL HIGH (ref 11.5–15.5)
WBC: 4.1 10*3/uL (ref 4.0–10.5)
nRBC: 0 % (ref 0.0–0.2)

## 2021-10-09 MED ORDER — HEPARIN SOD (PORK) LOCK FLUSH 100 UNIT/ML IV SOLN
500.0000 [IU] | Freq: Once | INTRAVENOUS | Status: AC | PRN
  Administered 2021-10-09: 500 [IU]
  Filled 2021-10-09: qty 5

## 2021-10-09 MED ORDER — SODIUM CHLORIDE 0.9 % IV SOLN
100.0000 mg/m2 | Freq: Once | INTRAVENOUS | Status: AC
  Administered 2021-10-09: 170 mg via INTRAVENOUS
  Filled 2021-10-09: qty 8.5

## 2021-10-09 MED ORDER — SODIUM CHLORIDE 0.9 % IV SOLN
10.0000 mg | Freq: Once | INTRAVENOUS | Status: AC
  Administered 2021-10-09: 10 mg via INTRAVENOUS
  Filled 2021-10-09: qty 10

## 2021-10-09 MED ORDER — SODIUM CHLORIDE 0.9 % IV SOLN
150.0000 mg | Freq: Once | INTRAVENOUS | Status: AC
  Administered 2021-10-09: 150 mg via INTRAVENOUS
  Filled 2021-10-09: qty 150

## 2021-10-09 MED ORDER — SODIUM CHLORIDE 0.9 % IV SOLN
1200.0000 mg | Freq: Once | INTRAVENOUS | Status: AC
  Administered 2021-10-09: 1200 mg via INTRAVENOUS
  Filled 2021-10-09: qty 20

## 2021-10-09 MED ORDER — PALONOSETRON HCL INJECTION 0.25 MG/5ML
0.2500 mg | Freq: Once | INTRAVENOUS | Status: AC
  Administered 2021-10-09: 0.25 mg via INTRAVENOUS
  Filled 2021-10-09: qty 5

## 2021-10-09 MED ORDER — SODIUM CHLORIDE 0.9 % IV SOLN
530.0000 mg | Freq: Once | INTRAVENOUS | Status: AC
  Administered 2021-10-09: 530 mg via INTRAVENOUS
  Filled 2021-10-09: qty 53

## 2021-10-09 MED ORDER — SODIUM CHLORIDE 0.9 % IV SOLN
Freq: Once | INTRAVENOUS | Status: AC
  Filled 2021-10-09: qty 250

## 2021-10-09 NOTE — Patient Instructions (Signed)
[facility] [place]  Discharge Instructions: Thank you for choosing [facility] to provide your oncology and hematology care.  If you have a lab appointment with the [facility], please go directly to the [facility] and check in at the registration area.  Wear comfortable clothing and clothing appropriate for easy access to any Portacath or PICC line.   We strive to give you quality time with your provider. You may need to reschedule your appointment if you arrive late (15 or more minutes).  Arriving late affects you and other patients whose appointments are after yours.  Also, if you miss three or more appointments without notifying the office, you may be dismissed from the clinic at the providers discretion.      For prescription refill requests, have your pharmacy contact our office and allow 72 hours for refills to be completed.    Today you received the following chemotherapy and/or immunotherapy agents TECENTRIQ, ETOPOSIDE, CARBOPLATIN      To help prevent nausea and vomiting after your treatment, we encourage you to take your nausea medication as directed.  BELOW ARE SYMPTOMS THAT SHOULD BE REPORTED IMMEDIATELY: *FEVER GREATER THAN 100.4 F (38 C) OR HIGHER *CHILLS OR SWEATING *NAUSEA AND VOMITING THAT IS NOT CONTROLLED WITH YOUR NAUSEA MEDICATION *UNUSUAL SHORTNESS OF BREATH *UNUSUAL BRUISING OR BLEEDING *URINARY PROBLEMS (pain or burning when urinating, or frequent urination) *BOWEL PROBLEMS (unusual diarrhea, constipation, pain near the anus) TENDERNESS IN MOUTH AND THROAT WITH OR WITHOUT PRESENCE OF ULCERS (sore throat, sores in mouth, or a toothache) UNUSUAL RASH, SWELLING OR PAIN  UNUSUAL VAGINAL DISCHARGE OR ITCHING   Items with * indicate a potential emergency and should be followed up as soon as possible or go to the Emergency Department if any problems should occur.  Please show the CHEMOTHERAPY ALERT CARD or IMMUNOTHERAPY  ALERT CARD at check-in to the Emergency Department and triage nurse.  Should you have questions after your visit or need to cancel or reschedule your appointment, please contact [facility] [place] [facility] [place]  [phone] and follow the prompts.  Office hours are 8:00 a.m. to 4:30 p.m. Monday - Friday. Please note that voicemails left after 4:00 p.m. may not be returned until the following business day.  We are closed weekends and major holidays. You have access to a nurse at all times for urgent questions. Please call the main number to the clinic [phone] and follow the prompts.  For any non-urgent questions, you may also contact your provider using MyChart. We now offer e-Visits for anyone 69 and older to request care online for non-urgent symptoms. For details visit [email or web address].   Also download the MyChart app! Go to the app store, search "MyChart", open the app, select [facility], and log in with your MyChart username and password.  Due to Covid, a mask is required upon entering the hospital/clinic. If you do not have a mask, one will be given to you upon arrival. For doctor visits, patients may have 1 support person aged 7 or older with them. For treatment visits, patients cannot have anyone with them due to current Covid guidelines and our immunocompromised population.   Atezolizumab injection What is this medication? ATEZOLIZUMAB (a te zoe LIZ ue mab) is a monoclonal antibody. It is used to treat bladder cancer (urothelial cancer), liver cancer, lung cancer, and melanoma. This medicine may be used for other purposes; ask your health care provider or pharmacist if you have questions. COMMON BRAND  NAME(S): Tecentriq What should I tell my care team before I take this medication? They need to know if you have any of these conditions: autoimmune diseases like Crohn's disease, ulcerative colitis, or lupus have had or planning to have an allogeneic stem cell  transplant (uses someone else's stem cells) history of organ transplant history of radiation to the chest nervous system problems like myasthenia gravis or Guillain-Barre syndrome an unusual or allergic reaction to atezolizumab, other medicines, foods, dyes, or preservatives pregnant or trying to get pregnant breast-feeding How should I use this medication? This medicine is for infusion into a vein. It is given by a health care professional in a hospital or clinic setting. A special MedGuide will be given to you before each treatment. Be sure to read this information carefully each time. Talk to your pediatrician regarding the use of this medicine in children. Special care may be needed. Overdosage: If you think you have taken too much of this medicine contact a poison control center or emergency room at once. NOTE: This medicine is only for you. Do not share this medicine with others. What if I miss a dose? It is important not to miss your dose. Call your doctor or health care professional if you are unable to keep an appointment. What may interact with this medication? Interactions have not been studied. This list may not describe all possible interactions. Give your health care provider a list of all the medicines, herbs, non-prescription drugs, or dietary supplements you use. Also tell them if you smoke, drink alcohol, or use illegal drugs. Some items may interact with your medicine. What should I watch for while using this medication? Your condition will be monitored carefully while you are receiving this medicine. You may need blood work done while you are taking this medicine. Do not become pregnant while taking this medicine or for at least 5 months after stopping it. Women should inform their doctor if they wish to become pregnant or think they might be pregnant. There is a potential for serious side effects to an unborn child. Talk to your health care professional or pharmacist for  more information. Do not breast-feed an infant while taking this medicine or for at least 5 months after the last dose. What side effects may I notice from receiving this medication? Side effects that you should report to your doctor or health care professional as soon as possible: allergic reactions like skin rash, itching or hives, swelling of the face, lips, or tongue black, tarry stools bloody or watery diarrhea breathing problems changes in vision chest pain or chest tightness chills facial flushing fever headache signs and symptoms of high blood sugar such as dizziness; dry mouth; dry skin; fruity breath; nausea; stomach pain; increased hunger or thirst; increased urination signs and symptoms of liver injury like dark yellow or brown urine; general ill feeling or flu-like symptoms; light-colored stools; loss of appetite; nausea; right upper belly pain; unusually weak or tired; yellowing of the eyes or skin stomach pain trouble passing urine or change in the amount of urine Side effects that usually do not require medical attention (report to your doctor or health care professional if they continue or are bothersome): bone pain cough diarrhea joint pain muscle pain muscle weakness swelling of arms or legs tiredness weight loss This list may not describe all possible side effects. Call your doctor for medical advice about side effects. You may report side effects to FDA at 1-800-FDA-1088. Where should I keep my medication?  This drug is given in a hospital or clinic and will not be stored at home. NOTE: This sheet is a summary. It may not cover all possible information. If you have questions about this medicine, talk to your doctor, pharmacist, or health care provider.  2022 Elsevier/Gold Standard (2021-05-08 00:00:00)  Etoposide, VP-16 injection What is this medication? ETOPOSIDE, VP-16 (e toe POE side) is a chemotherapy drug. It is used to treat testicular cancer, lung  cancer, and other cancers. This medicine may be used for other purposes; ask your health care provider or pharmacist if you have questions. COMMON BRAND NAME(S): Etopophos, Toposar, VePesid What should I tell my care team before I take this medication? They need to know if you have any of these conditions: infection kidney disease liver disease low blood counts, like low white cell, platelet, or red cell counts an unusual or allergic reaction to etoposide, other medicines, foods, dyes, or preservatives pregnant or trying to get pregnant breast-feeding How should I use this medication? This medicine is for infusion into a vein. It is administered in a hospital or clinic by a specially trained health care professional. Talk to your pediatrician regarding the use of this medicine in children. Special care may be needed. Overdosage: If you think you have taken too much of this medicine contact a poison control center or emergency room at once. NOTE: This medicine is only for you. Do not share this medicine with others. What if I miss a dose? It is important not to miss your dose. Call your doctor or health care professional if you are unable to keep an appointment. What may interact with this medication? This medicine may interact with the following medications: warfarin This list may not describe all possible interactions. Give your health care provider a list of all the medicines, herbs, non-prescription drugs, or dietary supplements you use. Also tell them if you smoke, drink alcohol, or use illegal drugs. Some items may interact with your medicine. What should I watch for while using this medication? Visit your doctor for checks on your progress. This drug may make you feel generally unwell. This is not uncommon, as chemotherapy can affect healthy cells as well as cancer cells. Report any side effects. Continue your course of treatment even though you feel ill unless your doctor tells you to  stop. In some cases, you may be given additional medicines to help with side effects. Follow all directions for their use. Call your doctor or health care professional for advice if you get a fever, chills or sore throat, or other symptoms of a cold or flu. Do not treat yourself. This drug decreases your body's ability to fight infections. Try to avoid being around people who are sick. This medicine may increase your risk to bruise or bleed. Call your doctor or health care professional if you notice any unusual bleeding. Talk to your doctor about your risk of cancer. You may be more at risk for certain types of cancers if you take this medicine. Do not become pregnant while taking this medicine or for at least 6 months after stopping it. Women should inform their doctor if they wish to become pregnant or think they might be pregnant. Women of child-bearing potential will need to have a negative pregnancy test before starting this medicine. There is a potential for serious side effects to an unborn child. Talk to your health care professional or pharmacist for more information. Do not breast-feed an infant while taking this medicine. Men  must use a latex condom during sexual contact with a woman while taking this medicine and for at least 4 months after stopping it. A latex condom is needed even if you have had a vasectomy. Contact your doctor right away if your partner becomes pregnant. Do not donate sperm while taking this medicine and for at least 4 months after you stop taking this medicine. Men should inform their doctors if they wish to father a child. This medicine may lower sperm counts. What side effects may I notice from receiving this medication? Side effects that you should report to your doctor or health care professional as soon as possible: allergic reactions like skin rash, itching or hives, swelling of the face, lips, or tongue low blood counts - this medicine may decrease the number of  white blood cells, red blood cells, and platelets. You may be at increased risk for infections and bleeding nausea, vomiting redness, blistering, peeling or loosening of the skin, including inside the mouth signs and symptoms of infection like fever; chills; cough; sore throat; pain or trouble passing urine signs and symptoms of low red blood cells or anemia such as unusually weak or tired; feeling faint or lightheaded; falls; breathing problems unusual bruising or bleeding Side effects that usually do not require medical attention (report to your doctor or health care professional if they continue or are bothersome): changes in taste diarrhea hair loss loss of appetite mouth sores This list may not describe all possible side effects. Call your doctor for medical advice about side effects. You may report side effects to FDA at 1-800-FDA-1088. Where should I keep my medication? This drug is given in a hospital or clinic and will not be stored at home. NOTE: This sheet is a summary. It may not cover all possible information. If you have questions about this medicine, talk to your doctor, pharmacist, or health care provider.  2022 Elsevier/Gold Standard (2021-05-08 00:00:00)  Carboplatin injection What is this medication? CARBOPLATIN (KAR boe pla tin) is a chemotherapy drug. It targets fast dividing cells, like cancer cells, and causes these cells to die. This medicine is used to treat ovarian cancer and many other cancers. This medicine may be used for other purposes; ask your health care provider or pharmacist if you have questions. COMMON BRAND NAME(S): Paraplatin What should I tell my care team before I take this medication? They need to know if you have any of these conditions: blood disorders hearing problems kidney disease recent or ongoing radiation therapy an unusual or allergic reaction to carboplatin, cisplatin, other chemotherapy, other medicines, foods, dyes, or  preservatives pregnant or trying to get pregnant breast-feeding How should I use this medication? This drug is usually given as an infusion into a vein. It is administered in a hospital or clinic by a specially trained health care professional. Talk to your pediatrician regarding the use of this medicine in children. Special care may be needed. Overdosage: If you think you have taken too much of this medicine contact a poison control center or emergency room at once. NOTE: This medicine is only for you. Do not share this medicine with others. What if I miss a dose? It is important not to miss a dose. Call your doctor or health care professional if you are unable to keep an appointment. What may interact with this medication? medicines for seizures medicines to increase blood counts like filgrastim, pegfilgrastim, sargramostim some antibiotics like amikacin, gentamicin, neomycin, streptomycin, tobramycin vaccines Talk to your doctor or  health care professional before taking any of these medicines: acetaminophen aspirin ibuprofen ketoprofen naproxen This list may not describe all possible interactions. Give your health care provider a list of all the medicines, herbs, non-prescription drugs, or dietary supplements you use. Also tell them if you smoke, drink alcohol, or use illegal drugs. Some items may interact with your medicine. What should I watch for while using this medication? Your condition will be monitored carefully while you are receiving this medicine. You will need important blood work done while you are taking this medicine. This drug may make you feel generally unwell. This is not uncommon, as chemotherapy can affect healthy cells as well as cancer cells. Report any side effects. Continue your course of treatment even though you feel ill unless your doctor tells you to stop. In some cases, you may be given additional medicines to help with side effects. Follow all directions for  their use. Call your doctor or health care professional for advice if you get a fever, chills or sore throat, or other symptoms of a cold or flu. Do not treat yourself. This drug decreases your body's ability to fight infections. Try to avoid being around people who are sick. This medicine may increase your risk to bruise or bleed. Call your doctor or health care professional if you notice any unusual bleeding. Be careful brushing and flossing your teeth or using a toothpick because you may get an infection or bleed more easily. If you have any dental work done, tell your dentist you are receiving this medicine. Avoid taking products that contain aspirin, acetaminophen, ibuprofen, naproxen, or ketoprofen unless instructed by your doctor. These medicines may hide a fever. Do not become pregnant while taking this medicine. Women should inform their doctor if they wish to become pregnant or think they might be pregnant. There is a potential for serious side effects to an unborn child. Talk to your health care professional or pharmacist for more information. Do not breast-feed an infant while taking this medicine. What side effects may I notice from receiving this medication? Side effects that you should report to your doctor or health care professional as soon as possible: allergic reactions like skin rash, itching or hives, swelling of the face, lips, or tongue signs of infection - fever or chills, cough, sore throat, pain or difficulty passing urine signs of decreased platelets or bleeding - bruising, pinpoint red spots on the skin, black, tarry stools, nosebleeds signs of decreased red blood cells - unusually weak or tired, fainting spells, lightheadedness breathing problems changes in hearing changes in vision chest pain high blood pressure low blood counts - This drug may decrease the number of white blood cells, red blood cells and platelets. You may be at increased risk for infections and  bleeding. nausea and vomiting pain, swelling, redness or irritation at the injection site pain, tingling, numbness in the hands or feet problems with balance, talking, walking trouble passing urine or change in the amount of urine Side effects that usually do not require medical attention (report to your doctor or health care professional if they continue or are bothersome): hair loss loss of appetite metallic taste in the mouth or changes in taste This list may not describe all possible side effects. Call your doctor for medical advice about side effects. You may report side effects to FDA at 1-800-FDA-1088. Where should I keep my medication? This drug is given in a hospital or clinic and will not be stored at home. NOTE: This  sheet is a summary. It may not cover all possible information. If you have questions about this medicine, talk to your doctor, pharmacist, or health care provider.  2022 Elsevier/Gold Standard (2008-01-27 00:00:00)

## 2021-10-09 NOTE — Addendum Note (Signed)
Addended by: Evelina Dun on: 10/09/2021 01:11 PM   Modules accepted: Orders

## 2021-10-09 NOTE — Progress Notes (Signed)
Nutrition  Patient requesting samples of ensure.  Provided few samples and coupons.    Jenna Newton B. Zenia Resides, [place], [place] Registered Dietitian [phone] (mobile)

## 2021-10-09 NOTE — Progress Notes (Signed)
Hematology/Oncology progress note [phone] [phone]   Patient Care Team: Delsa Grana, PA-C as PCP - General (Family Medicine) Telford Nab, RN as Oncology Nurse Navigator  REFERRING PROVIDER: Delsa Grana, PA-C  CHIEF COMPLAINTS/REASON FOR VISIT:  small cell lung cancer  HISTORY OF PRESENTING ILLNESS:   Jenna Newton is a  56 y.o.  female with PMH listed below was seen in consultation at the request of  Delsa Grana, PA-C  for evaluation of lung mass  05/08/2021, patient presented to emergency room for evaluation of expressive aphasia.  Acute onset of symptoms.  She/has no other neurological deficits at that time.  In ER, patient was found to have expressive aphasia with intermittent word at rest, though receptive and follows commands. MRI brain with and without contrast showed multiple small peripherally enhancing lesions.  Primary differential consideration is metastatic disease.   Patient declined being admitted.  Was eventually convinced to stay to do CT chest abdomen pelvis with contrast. CT showed a 3.2 cm posterior left upper lobe mass extending from the left hilum, concerning for primary bronchogenic carcinoma.  Enlarged left hilar and prominent bilateral peritracheal lymph nodes are identified.  Worrisome for nodal metastasis.  Similar appearance of bilateral adrenal nodule compared with 12/17/2013.  Favor benign adenomas.  Coronary artery calcifications, enlarged fibroid uterus. Patient prefers to be discharged home.  She was sent on dexamethasone 4 mg twice daily.  And Keppra 500 mg twice daily.  Patient was referred to establish care with cancer center. Today she has no difficulty in speech.  She was accompanied by her cousin. She has some gait change with right foot drop. No additional episodes of expressive aphasia.  Patient drinks alcohol, 12 packs of beer /week, current every day smoker.   05/25/2021 left upper lobe biopsy via bronchoscopy  showed small cell lung cancer. left upper lobe brushing/ LUB lavage + small cell lung cancer,  Precarinal lymph node biopsy- not enough tissue  Patient has baseline alopecia 05/30/2021- 06/13/2021 palliative brain RT.  06/04/2021 palliative chemotherapy treatments with carboplatin etoposide 06/25/2021, cycle 2 carboplatin etoposide and Tecentriq. Continued on carboplatin etoposide Tecentriq. Interim PET scan after 4 cycles of chemo was not approved by her insurance.  CT scan was obtained after 5 cycles of chemotherapy.  INTERVAL HISTORY Jenna Newton is a 56 y.o. female who has above history reviewed by me today presents for follow up visit for management of extensive small cell lung cancer Status post 6 cycles of carboplatin/etoposide. Reports feeling well denies  any nausea vomiting diarrhea fever or chills Patient takes Keppra 500 mg twice daily.  Patient has no new complaints.   Review of Systems  Constitutional:  Positive for fatigue. Negative for appetite change, chills and fever.  HENT:   Negative for hearing loss and voice change.        Alopecia-chronic  Eyes:  Negative for eye problems.  Respiratory:  Negative for chest tightness and cough.   Cardiovascular:  Negative for chest pain.  Gastrointestinal:  Negative for abdominal distention, abdominal pain and blood in stool.  Endocrine: Negative for hot flashes.  Genitourinary:  Negative for difficulty urinating and frequency.   Musculoskeletal:  Negative for arthralgias and gait problem.  Skin:  Negative for itching and rash.  Neurological:  Negative for extremity weakness, gait problem, headaches, seizures and speech difficulty.  Hematological:  Negative for adenopathy.  Psychiatric/Behavioral:  Negative for confusion.    MEDICAL HISTORY:  Past Medical History:  Diagnosis Date   Anemia  GERD (gastroesophageal reflux disease)    Hypertension    Neuropathy    Tobacco abuse 03/14/2017   Vertigo    White matter disease  of brain due to ischemia 12/04/2017   Head CT March 2019    SURGICAL HISTORY: Past Surgical History:  Procedure Laterality Date   none     PORTA CATH INSERTION N/A 06/18/2021   Procedure: PORTA CATH INSERTION;  Surgeon: Algernon Huxley, MD;  Location: [facility];  Service: Cardiovascular;  Laterality: N/A;   VIDEO BRONCHOSCOPY WITH ENDOBRONCHIAL NAVIGATION N/A 05/25/2021   Procedure: ROBOTIC ASSISTED VIDEO BRONCHOSCOPY WITH ENDOBRONCHIAL NAVIGATION;  Surgeon: Tyler Pita, MD;  Location: [facility];  Service: Pulmonary;  Laterality: N/A;   VIDEO BRONCHOSCOPY WITH ENDOBRONCHIAL ULTRASOUND N/A 05/25/2021   Procedure: VIDEO BRONCHOSCOPY WITH ENDOBRONCHIAL ULTRASOUND;  Surgeon: Tyler Pita, MD;  Location: [facility];  Service: Pulmonary;  Laterality: N/A;    SOCIAL HISTORY: Social History   Socioeconomic History   Marital status: Single    Spouse name: Not on file   Number of children: 0   Years of education: Not on file   Highest education level: Bachelor's degree (e.g., BA, AB, BS)  Occupational History   Not on file  Tobacco Use   Smoking status: Every Day    Packs/day: 1.00    Years: 25.00    Pack years: 25.00    Types: Cigarettes   Smokeless tobacco: Never  Vaping Use   Vaping Use: Never used  Substance and Sexual Activity   Alcohol use: Yes    Alcohol/week: 14.0 standard drinks    Types: 14 Cans of beer per week    Comment: 12 pack/ week   Drug use: No   Sexual activity: Yes    Partners: Male    Birth control/protection: Post-menopausal  Other Topics Concern   Not on file  Social History Narrative   Lives with brother   Social Determinants of Health   Financial Resource Strain: Not on file  Food Insecurity: Food Insecurity Present   Worried About Charity fundraiser in the Last Year: Sometimes true   Arboriculturist in the Last Year: Sometimes true  Transportation Needs: Not on file  Physical Activity: Not on file  Stress: Not on file  Social  Connections: Not on file  Intimate Partner Violence: Not on file    FAMILY HISTORY: Family History  Problem Relation Age of Onset   Diabetes Mother    Hypertension Mother    Emphysema Mother    Emphysema Father    Diabetes Brother    Alcohol abuse Brother    Diabetes Maternal Grandmother    Hypertension Maternal Grandmother    Blindness Maternal Grandmother    Cancer Maternal Grandfather        prostate   Emphysema Maternal Grandfather    Cancer Paternal Grandmother    Emphysema Paternal Grandfather     ALLERGIES:  is allergic to penicillins, meclizine, and sulfa antibiotics.  MEDICATIONS:  Current Outpatient Medications  Medication Sig Dispense Refill   amLODipine (NORVASC) 5 MG tablet Take 1 tablet (5 mg total) by mouth daily. 90 tablet 3   atorvastatin (LIPITOR) 40 MG tablet Take 40 mg by mouth at bedtime. 1 Tablet(s) By Mouth Every Night     cromolyn (OPTICROM) 4 % ophthalmic solution Place 1 drop into both eyes 4 (four) times daily as needed. 10 mL 3   fluticasone (FLONASE) 50 MCG/ACT nasal spray Place 1 spray into  both nostrils daily as needed for allergies.     hydrocortisone cream 0.5 % Apply 1 application topically 2 (two) times daily as needed for itching. 30 g 0   KLOR-CON M20 20 MEQ tablet TAKE 1 TABLET BY MOUTH EVERY DAY 30 tablet 1   levETIRAcetam (KEPPRA) 500 MG tablet TAKE 1 TABLET BY MOUTH TWICE A DAY 120 tablet 0   lidocaine-prilocaine (EMLA) cream Apply 1 application topically as needed. 30 g 3   montelukast (SINGULAIR) 10 MG tablet Take 1 tablet (10 mg total) by mouth at bedtime. 90 tablet 3   ondansetron (ZOFRAN) 8 MG tablet Take 1 tablet (8 mg total) by mouth 2 (two) times daily as needed for refractory nausea / vomiting. Start on day 3 after carboplatin chemo. 30 tablet 1   prochlorperazine (COMPAZINE) 10 MG tablet Take 1 tablet (10 mg total) by mouth every 6 (six) hours as needed (Nausea or vomiting). 30 tablet 1   saline (AYR) GEL Place 1 application  into the nose every 4 (four) hours. 14.1 g 3   Tiotropium Bromide-Olodaterol (STIOLTO RESPIMAT) 2.5-2.5 MCG/ACT AERS Inhale 2 puffs into the lungs daily. 1 each 0   vitamin B-12 (CYANOCOBALAMIN) 500 MCG tablet Take 1 tablet (500 mcg total) by mouth daily. 100 tablet 3   dexamethasone (DECADRON) 1 MG tablet Take 1 tablet (1 mg total) by mouth See admin instructions. Take 3mg  daily for 1 week and then take 2mg  daily for 1 week. (Patient not taking: Reported on 10/09/2021) 35 tablet 0   dexamethasone (DECADRON) 1 MG tablet Take 1 tablet (1 mg total) by mouth daily. (Patient not taking: Reported on 10/09/2021) 14 tablet 0   No current facility-administered medications for this visit.     PHYSICAL EXAMINATION: ECOG PERFORMANCE STATUS: 1 - Symptomatic but completely ambulatory Vitals:   10/09/21 0829  BP: 118/74  Pulse: 93  Resp: 18  Temp: (!) 97.4 F (36.3 C)  SpO2: 99%   Filed Weights   10/09/21 0829  Weight: 148 lb 3.2 oz (67.2 kg)      Physical Exam Constitutional:      General: She is not in acute distress. HENT:     Head: Normocephalic and atraumatic.     Comments: Alopecia Eyes:     General: No scleral icterus. Cardiovascular:     Rate and Rhythm: Normal rate and regular rhythm.     Heart sounds: Normal heart sounds.  Pulmonary:     Effort: Pulmonary effort is normal. No respiratory distress.     Breath sounds: No wheezing.  Abdominal:     General: Bowel sounds are normal. There is no distension.     Palpations: Abdomen is soft.  Musculoskeletal:        General: No deformity. Normal range of motion.     Cervical back: Normal range of motion and neck supple.  Skin:    General: Skin is warm and dry.     Findings: No erythema or rash.  Neurological:     Mental Status: She is alert and oriented to person, place, and time. Mental status is at baseline.     Cranial Nerves: No cranial nerve deficit.     Coordination: Coordination normal.     Comments: Gait has improved.    Psychiatric:        Mood and Affect: Mood normal.    LABORATORY DATA:  I have reviewed the data as listed Lab Results  Component Value Date   WBC 4.1 10/09/2021  HGB 12.1 10/09/2021   HCT 38.1 10/09/2021   MCV 88.0 10/09/2021   PLT 282 10/09/2021   Recent Labs    08/28/21 0845 09/18/21 0806 10/09/21 0752  NA 135 137 136  K 3.4* 3.7 3.7  CL 99 100 103  CO2 28 27 27   GLUCOSE 98 98 94  BUN 8 8 8   CREATININE 0.35* 0.34* 0.40*  CALCIUM 9.2 9.3 9.2  GFRNONAA >60 >60 >60  PROT 7.5 7.3 7.6  ALBUMIN 3.9 3.8 4.0  AST 16 17 17   ALT 15 16 16   ALKPHOS 70 71 78  BILITOT 0.4 0.4 0.3    Iron/TIBC/Ferritin/ %Sat    Component Value Date/Time   FERRITIN 126 03/14/2017 0926       RADIOGRAPHIC STUDIES: I have personally reviewed the radiological images as listed and agreed with the findings in the report. CT CHEST ABDOMEN PELVIS W CONTRAST  Result Date: 09/12/2021 CLINICAL DATA:  Metastatic small-cell lung cancer restaging, ongoing chemotherapy and immunotherapy EXAM: CT CHEST, ABDOMEN, AND PELVIS WITH CONTRAST TECHNIQUE: Multidetector CT imaging of the chest, abdomen and pelvis was performed following the standard protocol during bolus administration of intravenous contrast. RADIATION DOSE REDUCTION: This exam was performed according to the departmental dose-optimization program which includes automated exposure control, adjustment of the mA and/or kV according to patient size and/or use of iterative reconstruction technique. CONTRAST:  188mL OMNIPAQUE IOHEXOL 300 MG/ML SOLN, additional oral enteric contrast COMPARISON:  CT chest, 05/24/2021, CT chest abdomen pelvis, 05/08/2021 FINDINGS: CT CHEST FINDINGS Cardiovascular: Right chest port catheter. Aortic atherosclerosis. Normal heart size. Three-vessel coronary artery calcifications. No pericardial effusion. Mediastinum/Nodes: Unchanged prominent pretracheal and left hilar lymph nodes, pretracheal node measuring up to 1.5 x 1.3 cm  (series 2, image 25). Thymic remnant in the anterior mediastinum. Thyroid gland, trachea, and esophagus demonstrate no significant findings. Lungs/Pleura: Moderate centrilobular emphysema. Interval decrease in size of an elongated, somewhat tubular nodule in the posterior left upper lobe, now measuring 2.7 x 0.9 cm in the sagittal plane, previously 3.2 x 1.2 cm (series 6, image 119). Unchanged, bandlike scarring of the lateral segment right middle lobe (series 3, image 96). No pleural effusion or pneumothorax. Musculoskeletal: No chest wall mass or suspicious osseous lesions identified. CT ABDOMEN PELVIS FINDINGS Hepatobiliary: No solid liver abnormality is seen. No gallstones, gallbladder wall thickening, or biliary dilatation. Pancreas: Unremarkable. No pancreatic ductal dilatation or surrounding inflammatory changes. Spleen: Normal in size without significant abnormality. Adrenals/Urinary Tract: Interval enlargement of lateral limb left adrenal nodule, measuring 1.9 x 1.6 cm, previously 1.4 x 0.7 cm (series 2, image 65). Unchanged nodule of the right adrenal body measuring 2.3 x 2.0 cm (series 2, image 65). Kidneys are normal, without renal calculi, solid lesion, or hydronephrosis. Bladder is unremarkable. Stomach/Bowel: Stomach is within normal limits. Appendix appears normal. No evidence of bowel wall thickening, distention, or inflammatory changes. Occasional sigmoid diverticula. Vascular/Lymphatic: Aortic atherosclerosis. No enlarged abdominal or pelvic lymph nodes. Reproductive: Bulky, calcified uterine fibroids. Other: No abdominal wall hernia or abnormality. No ascites. Musculoskeletal: No acute osseous findings. IMPRESSION: 1. Interval decrease in size of an elongated, somewhat tubular nodule in the posterior left upper lobe, consistent with treatment response. 2. Unchanged prominent pretracheal and left hilar lymph nodes, which remain worrisome for nodal metastases. Continued attention on follow-up. 3.  Interval enlargement of lateral limb left adrenal nodule, consistent with a new or enlarged adrenal metastasis. Unchanged nodule of the right adrenal body, stable long-term and consistent with a benign adenoma. 4. No evidence  of new metastatic disease in the chest, abdomen, or pelvis. 5. Emphysema. 6. Coronary artery disease. 7. Uterine fibroids. Aortic Atherosclerosis (ICD10-I70.0) and Emphysema (ICD10-J43.9). Electronically Signed   By: Delanna Ahmadi M.D.   On: 09/12/2021 14:57      ASSESSMENT & PLAN:  1. Small cell lung cancer ([place])   2. Encounter for antineoplastic chemotherapy   3. Encounter for antineoplastic immunotherapy   4. Antineoplastic chemotherapy induced anemia   5. Seizure ([place])   6. Brain lesion    Cancer Staging  Small cell lung cancer [facility]) Staging form: Lung, AJCC 8th Edition - Clinical stage from 06/01/2021: Stage IV (cT2a, cN3, cM1) - Signed by Earlie Server, MD on 06/01/2021  # Extensive small cell cancer, brain metastatic disease Palliative chemotherapy carboplatin + etoposide and Tecentriq., and is post palliative brain radiation. 09/12/2021 CT chest abdomen pelvis with contrast was reviewed and discussed with patient Mixed response.Decrease of posterior left upper lobe mass, unchanged pretracheal and left hilar lymph nodes.  Interval enlargement of lateral limb left adrenal nodule, Unchanged right adrenal body.  No evidence of new metastasis in the chest abdomen pelvis. Mixed response. I would like to obtain a PET scan for evaluation.  Pending insurance approval. I discussed with Dr. Baruch Gouty who will see patient for SBRT to the adrenal gland. She may also need a chest radiation if the residual lymphadenopathy is hypermetabolic. Proceed with cycle 7 carboplatin/etoposide/Tecentriq to bridge for PET scan and radiation. Discussed with patient about G-CSF support and patient declined.  She has been doing well for the past 6 cycles.  Close monitor.   # Brain metastasis and  seizure S/p RT, s/p steroid taper.  Continue Keppra 500mg  BID. Off dexamethasone now.  #Hypokalemia, continue potassium chloride 71meq daily.  Follow up to be determined-depending on PET scan, radiation schedule.   All questions were answered. The patient knows to call the clinic with any problems questions or concerns.  cc Delsa Grana, PA-C    Earlie Server, MD, PhD  10/09/2021

## 2021-10-09 NOTE — Progress Notes (Signed)
Pt here for follow up. No new concerns voiced.   

## 2021-10-10 ENCOUNTER — Inpatient Hospital Stay: Payer: Medicaid Other

## 2021-10-10 VITALS — BP 112/66 | HR 73 | Temp 97.6°F

## 2021-10-10 DIAGNOSIS — C3490 Malignant neoplasm of unspecified part of unspecified bronchus or lung: Secondary | ICD-10-CM

## 2021-10-10 DIAGNOSIS — Z5112 Encounter for antineoplastic immunotherapy: Secondary | ICD-10-CM | POA: Diagnosis not present

## 2021-10-10 MED ORDER — SODIUM CHLORIDE 0.9 % IV SOLN
100.0000 mg/m2 | Freq: Once | INTRAVENOUS | Status: AC
  Administered 2021-10-10: 170 mg via INTRAVENOUS
  Filled 2021-10-10: qty 8.5

## 2021-10-10 MED ORDER — SODIUM CHLORIDE 0.9 % IV SOLN
10.0000 mg | Freq: Once | INTRAVENOUS | Status: AC
  Administered 2021-10-10: 10 mg via INTRAVENOUS
  Filled 2021-10-10: qty 10

## 2021-10-10 MED ORDER — HEPARIN SOD (PORK) LOCK FLUSH 100 UNIT/ML IV SOLN
500.0000 [IU] | Freq: Once | INTRAVENOUS | Status: AC | PRN
  Administered 2021-10-10: 500 [IU]
  Filled 2021-10-10: qty 5

## 2021-10-10 MED ORDER — SODIUM CHLORIDE 0.9 % IV SOLN
Freq: Once | INTRAVENOUS | Status: AC
  Filled 2021-10-10: qty 250

## 2021-10-10 NOTE — Patient Instructions (Signed)
[facility] AT [place]   Discharge Instructions: Thank you for choosing [facility] to provide your oncology and hematology care.  If you have a lab appointment with the [facility], please go directly to the [facility] and check in at the registration area.  Wear comfortable clothing and clothing appropriate for easy access to any Portacath or PICC line.   We strive to give you quality time with your provider. You may need to reschedule your appointment if you arrive late (15 or more minutes).  Arriving late affects you and other patients whose appointments are after yours.  Also, if you miss three or more appointments without notifying the office, you may be dismissed from the clinic at the providers discretion.      For prescription refill requests, have your pharmacy contact our office and allow 72 hours for refills to be completed.    Today you received the following chemotherapy and/or immunotherapy agents - Etoposide      To help prevent nausea and vomiting after your treatment, we encourage you to take your nausea medication as directed.  BELOW ARE SYMPTOMS THAT SHOULD BE REPORTED IMMEDIATELY: *FEVER GREATER THAN 100.4 F (38 C) OR HIGHER *CHILLS OR SWEATING *NAUSEA AND VOMITING THAT IS NOT CONTROLLED WITH YOUR NAUSEA MEDICATION *UNUSUAL SHORTNESS OF BREATH *UNUSUAL BRUISING OR BLEEDING *URINARY PROBLEMS (pain or burning when urinating, or frequent urination) *BOWEL PROBLEMS (unusual diarrhea, constipation, pain near the anus) TENDERNESS IN MOUTH AND THROAT WITH OR WITHOUT PRESENCE OF ULCERS (sore throat, sores in mouth, or a toothache) UNUSUAL RASH, SWELLING OR PAIN  UNUSUAL VAGINAL DISCHARGE OR ITCHING   Items with * indicate a potential emergency and should be followed up as soon as possible or go to the Emergency Department if any problems should occur.  Please show the CHEMOTHERAPY ALERT CARD or IMMUNOTHERAPY ALERT CARD at check-in  to the Emergency Department and triage nurse.  Should you have questions after your visit or need to cancel or reschedule your appointment, please contact [facility] [place] AT [place]  [phone] and follow the prompts.  Office hours are 8:00 a.m. to 4:30 p.m. Monday - Friday. Please note that voicemails left after 4:00 p.m. may not be returned until the following business day.  We are closed weekends and major holidays. You have access to a nurse at all times for urgent questions. Please call the main number to the clinic [phone] and follow the prompts.  For any non-urgent questions, you may also contact your provider using MyChart. We now offer e-Visits for anyone 89 and older to request care online for non-urgent symptoms. For details visit [email or web address].   Also download the MyChart app! Go to the app store, search "MyChart", open the app, select [facility], and log in with your MyChart username and password.  Due to Covid, a mask is required upon entering the hospital/clinic. If you do not have a mask, one will be given to you upon arrival. For doctor visits, patients may have 1 support person aged 76 or older with them. For treatment visits, patients cannot have anyone with them due to current Covid guidelines and our immunocompromised population.   Dexamethasone injection What is this medication? DEXAMETHASONE (dex a METH a sone) is a corticosteroid. It is used to treat inflammation of the skin, joints, lungs, and other organs. Common conditions treated include asthma, allergies, and arthritis. It is also used for other conditions, like blood disorders and diseases of the adrenal glands.  This medicine may be used for other purposes; ask your health care provider or pharmacist if you have questions. COMMON BRAND NAME(S): Decadron, DoubleDex, ReadySharp Dexamethasone, Simplist Dexamethasone, Solurex What should I tell my care team before I take this  medication? They need to know if you have any of these conditions: Cushing's syndrome diabetes glaucoma heart disease high blood pressure infection like herpes, measles, tuberculosis, or chickenpox kidney disease liver disease mental illness myasthenia gravis osteoporosis previous heart attack seizures stomach or intestine problems thyroid disease an unusual or allergic reaction to dexamethasone, corticosteroids, other medicines, lactose, foods, dyes, or preservatives pregnant or trying to get pregnant breast-feeding How should I use this medication? This medicine is for injection into a muscle, joint, lesion, soft tissue, or vein. It is given by a health care professional in a hospital or clinic setting. Talk to your pediatrician regarding the use of this medicine in children. Special care may be needed. Overdosage: If you think you have taken too much of this medicine contact a poison control center or emergency room at once. NOTE: This medicine is only for you. Do not share this medicine with others. What if I miss a dose? This may not apply. If you are having a series of injections over a prolonged period, try not to miss an appointment. Call your doctor or health care professional to reschedule if you are unable to keep an appointment. What may interact with this medication? Do not take this medicine with any of the following medications: live virus vaccines This medicine may also interact with the following medications: aminoglutethimide amphotericin B aspirin and aspirin-like medicines certain antibiotics like erythromycin, clarithromycin, and troleandomycin certain antivirals for HIV or hepatitis certain medicines for seizures like carbamazepine, phenobarbital, phenytoin certain medicines to treat myasthenia gravis cholestyramine cyclosporine digoxin diuretics ephedrine female hormones, like estrogen or progestins and birth control pills insulin or other medicines  for diabetes isoniazid ketoconazole medicines that relax muscles for surgery mifepristone NSAIDs, medicines for pain and inflammation, like ibuprofen or naproxen rifampin skin tests for allergies thalidomide vaccines warfarin This list may not describe all possible interactions. Give your health care provider a list of all the medicines, herbs, non-prescription drugs, or dietary supplements you use. Also tell them if you smoke, drink alcohol, or use illegal drugs. Some items may interact with your medicine. What should I watch for while using this medication? Visit your health care professional for regular checks on your progress. Tell your health care professional if your symptoms do not start to get better or if they get worse. Your condition will be monitored carefully while you are receiving this medicine. Wear a medical ID bracelet or chain. Carry a card that describes your disease and details of your medicine and dosage times. This medicine may increase your risk of getting an infection. Call your health care professional for advice if you get a fever, chills, or sore throat, or other symptoms of a cold or flu. Do not treat yourself. Try to avoid being around people who are sick. Call your health care professional if you are around anyone with measles, chickenpox, or if you develop sores or blisters that do not heal properly. If you are going to need surgery or other procedures, tell your doctor or health care professional that you have taken this medicine within the last 12 months. Ask your doctor or health care professional about your diet. You may need to lower the amount of salt you eat. This medicine may increase blood sugar.  Ask your healthcare provider if changes in diet or medicines are needed if you have diabetes. What side effects may I notice from receiving this medication? Side effects that you should report to your doctor or health care professional as soon as  possible: allergic reactions like skin rash, itching or hives, swelling of the face, lips, or tongue bloody or black, tarry stools changes in emotions or moods changes in vision confusion, excitement, restlessness depressed mood eye pain hallucinations muscle weakness severe or sudden stomach or belly pain signs and symptoms of high blood sugar such as being more thirsty or hungry or having to urinate more than normal. You may also feel very tired or have blurry vision. signs and symptoms of infection like fever; chills; cough; sore throat; pain or trouble passing urine swelling of ankles, feet unusual bruising or bleeding wounds that do not heal Side effects that usually do not require medical attention (report to your doctor or health care professional if they continue or are bothersome): increased appetite increased growth of face or body hair headache nausea, vomiting pain, redness, or irritation at site where injected skin problems, acne, thin and shiny skin trouble sleeping weight gain This list may not describe all possible side effects. Call your doctor for medical advice about side effects. You may report side effects to FDA at 1-800-FDA-1088. Where should I keep my medication? This medicine is given in a hospital or clinic and will not be stored at home. NOTE: This sheet is a summary. It may not cover all possible information. If you have questions about this medicine, talk to your doctor, pharmacist, or health care provider.  2022 Elsevier/Gold Standard (2019-03-04 00:00:00)  Etoposide, VP-16 injection What is this medication? ETOPOSIDE, VP-16 (e toe POE side) is a chemotherapy drug. It is used to treat testicular cancer, lung cancer, and other cancers. This medicine may be used for other purposes; ask your health care provider or pharmacist if you have questions. COMMON BRAND NAME(S): Etopophos, Toposar, VePesid What should I tell my care team before I take this  medication? They need to know if you have any of these conditions: infection kidney disease liver disease low blood counts, like low Sterling Ucci cell, platelet, or red cell counts an unusual or allergic reaction to etoposide, other medicines, foods, dyes, or preservatives pregnant or trying to get pregnant breast-feeding How should I use this medication? This medicine is for infusion into a vein. It is administered in a hospital or clinic by a specially trained health care professional. Talk to your pediatrician regarding the use of this medicine in children. Special care may be needed. Overdosage: If you think you have taken too much of this medicine contact a poison control center or emergency room at once. NOTE: This medicine is only for you. Do not share this medicine with others. What if I miss a dose? It is important not to miss your dose. Call your doctor or health care professional if you are unable to keep an appointment. What may interact with this medication? This medicine may interact with the following medications: warfarin This list may not describe all possible interactions. Give your health care provider a list of all the medicines, herbs, non-prescription drugs, or dietary supplements you use. Also tell them if you smoke, drink alcohol, or use illegal drugs. Some items may interact with your medicine. What should I watch for while using this medication? Visit your doctor for checks on your progress. This drug may make you feel generally unwell.  This is not uncommon, as chemotherapy can affect healthy cells as well as cancer cells. Report any side effects. Continue your course of treatment even though you feel ill unless your doctor tells you to stop. In some cases, you may be given additional medicines to help with side effects. Follow all directions for their use. Call your doctor or health care professional for advice if you get a fever, chills or sore throat, or other symptoms of  a cold or flu. Do not treat yourself. This drug decreases your body's ability to fight infections. Try to avoid being around people who are sick. This medicine may increase your risk to bruise or bleed. Call your doctor or health care professional if you notice any unusual bleeding. Talk to your doctor about your risk of cancer. You may be more at risk for certain types of cancers if you take this medicine. Do not become pregnant while taking this medicine or for at least 6 months after stopping it. Women should inform their doctor if they wish to become pregnant or think they might be pregnant. Women of child-bearing potential will need to have a negative pregnancy test before starting this medicine. There is a potential for serious side effects to an unborn child. Talk to your health care professional or pharmacist for more information. Do not breast-feed an infant while taking this medicine. Men must use a latex condom during sexual contact with a woman while taking this medicine and for at least 4 months after stopping it. A latex condom is needed even if you have had a vasectomy. Contact your doctor right away if your partner becomes pregnant. Do not donate sperm while taking this medicine and for at least 4 months after you stop taking this medicine. Men should inform their doctors if they wish to father a child. This medicine may lower sperm counts. What side effects may I notice from receiving this medication? Side effects that you should report to your doctor or health care professional as soon as possible: allergic reactions like skin rash, itching or hives, swelling of the face, lips, or tongue low blood counts - this medicine may decrease the number of Jenisha Faison blood cells, red blood cells, and platelets. You may be at increased risk for infections and bleeding nausea, vomiting redness, blistering, peeling or loosening of the skin, including inside the mouth signs and symptoms of infection like  fever; chills; cough; sore throat; pain or trouble passing urine signs and symptoms of low red blood cells or anemia such as unusually weak or tired; feeling faint or lightheaded; falls; breathing problems unusual bruising or bleeding Side effects that usually do not require medical attention (report to your doctor or health care professional if they continue or are bothersome): changes in taste diarrhea hair loss loss of appetite mouth sores This list may not describe all possible side effects. Call your doctor for medical advice about side effects. You may report side effects to FDA at 1-800-FDA-1088. Where should I keep my medication? This drug is given in a hospital or clinic and will not be stored at home. NOTE: This sheet is a summary. It may not cover all possible information. If you have questions about this medicine, talk to your doctor, pharmacist, or health care provider.  2022 Elsevier/Gold Standard (2021-05-08 00:00:00)

## 2021-10-11 ENCOUNTER — Other Ambulatory Visit: Payer: Self-pay

## 2021-10-11 ENCOUNTER — Inpatient Hospital Stay: Payer: Medicaid Other

## 2021-10-11 VITALS — BP 120/66 | HR 88 | Temp 98.2°F | Resp 18

## 2021-10-11 DIAGNOSIS — Z5112 Encounter for antineoplastic immunotherapy: Secondary | ICD-10-CM | POA: Diagnosis not present

## 2021-10-11 DIAGNOSIS — C3490 Malignant neoplasm of unspecified part of unspecified bronchus or lung: Secondary | ICD-10-CM

## 2021-10-11 MED ORDER — SODIUM CHLORIDE 0.9 % IV SOLN
10.0000 mg | Freq: Once | INTRAVENOUS | Status: AC
  Administered 2021-10-11: 10 mg via INTRAVENOUS
  Filled 2021-10-11: qty 10

## 2021-10-11 MED ORDER — SODIUM CHLORIDE 0.9 % IV SOLN
100.0000 mg/m2 | Freq: Once | INTRAVENOUS | Status: AC
  Administered 2021-10-11: 170 mg via INTRAVENOUS
  Filled 2021-10-11: qty 8.5

## 2021-10-11 MED ORDER — HEPARIN SOD (PORK) LOCK FLUSH 100 UNIT/ML IV SOLN
500.0000 [IU] | Freq: Once | INTRAVENOUS | Status: AC | PRN
  Administered 2021-10-11: 500 [IU]
  Filled 2021-10-11: qty 5

## 2021-10-11 MED ORDER — SODIUM CHLORIDE 0.9 % IV SOLN
Freq: Once | INTRAVENOUS | Status: AC
  Filled 2021-10-11: qty 250

## 2021-10-11 MED ORDER — SODIUM CHLORIDE 0.9% FLUSH
10.0000 mL | INTRAVENOUS | Status: DC | PRN
  Administered 2021-10-11: 10 mL
  Filled 2021-10-11: qty 10

## 2021-10-12 ENCOUNTER — Encounter: Payer: Self-pay | Admitting: *Deleted

## 2021-10-12 ENCOUNTER — Other Ambulatory Visit: Payer: Self-pay | Admitting: Oncology

## 2021-10-13 ENCOUNTER — Encounter: Payer: Self-pay | Admitting: Oncology

## 2021-10-16 ENCOUNTER — Other Ambulatory Visit: Payer: Self-pay | Admitting: Oncology

## 2021-10-16 NOTE — Telephone Encounter (Signed)
°  Component Ref Range & Units 7 d ago (10/09/21) 4 wk ago (09/18/21) 1 mo ago (08/28/21) 2 mo ago (08/06/21) 3 mo ago (07/16/21) 3 mo ago (07/05/21) 3 mo ago (06/25/21)  Potassium 3.5 - 5.1 mmol/L 3.7  3.7  3.4 Low   3.6  4.3  3.5  4.0

## 2021-10-17 ENCOUNTER — Encounter
Admission: RE | Admit: 2021-10-17 | Discharge: 2021-10-17 | Disposition: A | Discharge status: Home / Self Care | Payer: Medicaid Other | Source: Ambulatory Visit | Attending: Oncology | Admitting: Oncology

## 2021-10-17 ENCOUNTER — Other Ambulatory Visit: Payer: Self-pay

## 2021-10-17 ENCOUNTER — Encounter: Payer: Self-pay | Admitting: Oncology

## 2021-10-17 DIAGNOSIS — C3490 Malignant neoplasm of unspecified part of unspecified bronchus or lung: Secondary | ICD-10-CM | POA: Diagnosis not present

## 2021-10-17 LAB — GLUCOSE, CAPILLARY: Glucose-Capillary: 86 mg/dL (ref 70–99)

## 2021-10-17 MED ORDER — FLUDEOXYGLUCOSE F - 18 (FDG) INJECTION
7.7000 | Freq: Once | INTRAVENOUS | Status: AC
  Administered 2021-10-17: 8.33 via INTRAVENOUS

## 2021-10-18 ENCOUNTER — Encounter: Payer: Self-pay | Admitting: Oncology

## 2021-10-18 ENCOUNTER — Ambulatory Visit
Admission: RE | Admit: 2021-10-18 | Discharge: 2021-10-18 | Disposition: A | Discharge status: Home / Self Care | Payer: Medicaid Other | Source: Ambulatory Visit | Attending: Radiation Oncology | Admitting: Radiation Oncology

## 2021-10-18 ENCOUNTER — Inpatient Hospital Stay: Payer: Medicaid Other

## 2021-10-18 ENCOUNTER — Encounter: Payer: Self-pay | Admitting: Radiation Oncology

## 2021-10-18 VITALS — BP 154/66 | HR 98 | Temp 96.8°F | Resp 16 | Wt 150.9 lb

## 2021-10-18 DIAGNOSIS — Z5112 Encounter for antineoplastic immunotherapy: Secondary | ICD-10-CM | POA: Diagnosis not present

## 2021-10-18 DIAGNOSIS — C7931 Secondary malignant neoplasm of brain: Secondary | ICD-10-CM | POA: Insufficient documentation

## 2021-10-18 DIAGNOSIS — C3412 Malignant neoplasm of upper lobe, left bronchus or lung: Secondary | ICD-10-CM | POA: Insufficient documentation

## 2021-10-18 DIAGNOSIS — C7972 Secondary malignant neoplasm of left adrenal gland: Secondary | ICD-10-CM | POA: Insufficient documentation

## 2021-10-18 DIAGNOSIS — C7971 Secondary malignant neoplasm of right adrenal gland: Secondary | ICD-10-CM | POA: Insufficient documentation

## 2021-10-18 DIAGNOSIS — Z923 Personal history of irradiation: Secondary | ICD-10-CM | POA: Insufficient documentation

## 2021-10-18 DIAGNOSIS — C3490 Malignant neoplasm of unspecified part of unspecified bronchus or lung: Secondary | ICD-10-CM

## 2021-10-18 NOTE — Progress Notes (Signed)
Radiation Oncology Follow up Note  Name: Jenna Newton   Date:   10/18/2021 MRN:  [id number] DOB: 1966-02-26    This 56 y.o. female presents to the clinic today for reevaluation of progressive left adrenal metastasis and persistent PET positive nodes in her mediastinum in a patient with known extensive stage small cell lung cancer previously treated with whole brain radiation for metastatic disease.  REFERRING PROVIDER: Delsa Grana, PA-C  HPI: Patient is a 56 year old female well-known to our department having received whole brain radiation therapy secondary to metastatic disease back in October 2022.  She has documented extensive stage small cell lung cancer.  She has had an excellent mixed response to treatment with a decrease in the posterior left upper lobe mass.  CT scan shows unchanged pretracheal left hilar lymph nodes and slight enlargement of the lateral limb of the left adrenal nodule.  All these areas were PET positive on recent PET CT scan.  She continues to be asymptomatic having no change in neurologic status.  She is having no abdominal pain.  Mild cough no hemoptysis or chest tightness..  COMPLICATIONS OF TREATMENT: none  FOLLOW UP COMPLIANCE: keeps appointments   PHYSICAL EXAM:  BP (!) 154/66 (BP Location: Left Arm, Patient Position: Sitting)    Pulse 98    Temp (!) 96.8 F (36 C) (Tympanic)    Resp 16    Wt 150 lb 14.4 oz (68.4 kg)    LMP 01/23/2015    BMI 24.36 kg/m  Well-developed well-nourished patient in NAD. HEENT reveals PERLA, EOMI, discs not visualized.  Oral cavity is clear. No oral mucosal lesions are identified. Neck is clear without evidence of cervical or supraclavicular adenopathy. Lungs are clear to A&P. Cardiac examination is essentially unremarkable with regular rate and rhythm without murmur rub or thrill. Abdomen is benign with no organomegaly or masses noted. Motor sensory and DTR levels are equal and symmetric in the upper and lower extremities. Cranial  nerves II through XII are grossly intact. Proprioception is intact. No peripheral adenopathy or edema is identified. No motor or sensory levels are noted. Crude visual fields are within normal range.  RADIOLOGY RESULTS: CT scans and PET CT scans reviewed compatible with above-stated findings  PLAN: At the present time elected ahead with SBRT to her left adrenal gland.  Would plan on delivering 40 Gray in 5 fractions using SBRT.  We will use motion restriction as well as PET fusion study for treatment planning purposes.  Low side effect profile including possible fatigue some abdominal discomfort were discussed with the patient in detail.  I also will consider after SBRT planning on a short palliative course of radiation therapy to her residual hilar and mediastinal PET positive nodes.  We will treat that to 40 Gray in 20 fractions.  That will be sequenced after adrenal SBRT.  We will coordinate that with medical oncology for possible alteration of her chemotherapy schedule.  Patient comprehends my recommendations well.  I would like to take this opportunity to thank you for allowing me to participate in the care of your patient.Noreene Filbert, MD

## 2021-10-19 ENCOUNTER — Encounter: Payer: Self-pay | Admitting: Oncology

## 2021-10-24 ENCOUNTER — Encounter: Payer: Self-pay | Admitting: Oncology

## 2021-10-24 ENCOUNTER — Ambulatory Visit
Admission: RE | Admit: 2021-10-24 | Discharge: 2021-10-24 | Disposition: A | Discharge status: Home / Self Care | Payer: Medicaid Other | Source: Ambulatory Visit | Attending: Radiation Oncology | Admitting: Radiation Oncology

## 2021-10-24 DIAGNOSIS — C3490 Malignant neoplasm of unspecified part of unspecified bronchus or lung: Secondary | ICD-10-CM | POA: Insufficient documentation

## 2021-10-24 DIAGNOSIS — C7972 Secondary malignant neoplasm of left adrenal gland: Secondary | ICD-10-CM | POA: Diagnosis not present

## 2021-11-01 DIAGNOSIS — C7972 Secondary malignant neoplasm of left adrenal gland: Secondary | ICD-10-CM | POA: Insufficient documentation

## 2021-11-01 DIAGNOSIS — C3490 Malignant neoplasm of unspecified part of unspecified bronchus or lung: Secondary | ICD-10-CM | POA: Diagnosis present

## 2021-11-05 ENCOUNTER — Ambulatory Visit
Admission: RE | Admit: 2021-11-05 | Discharge: 2021-11-05 | Disposition: A | Discharge status: Home / Self Care | Payer: Medicaid Other | Source: Ambulatory Visit | Attending: Radiation Oncology | Admitting: Radiation Oncology

## 2021-11-05 ENCOUNTER — Other Ambulatory Visit: Payer: Self-pay

## 2021-11-05 ENCOUNTER — Inpatient Hospital Stay: Payer: Medicaid Other | Attending: Oncology

## 2021-11-05 DIAGNOSIS — C7972 Secondary malignant neoplasm of left adrenal gland: Secondary | ICD-10-CM | POA: Diagnosis not present

## 2021-11-07 ENCOUNTER — Inpatient Hospital Stay: Payer: Medicaid Other

## 2021-11-07 ENCOUNTER — Ambulatory Visit
Admission: RE | Admit: 2021-11-07 | Discharge: 2021-11-07 | Disposition: A | Discharge status: Home / Self Care | Payer: Medicaid Other | Source: Ambulatory Visit | Attending: Radiation Oncology | Admitting: Radiation Oncology

## 2021-11-07 ENCOUNTER — Other Ambulatory Visit: Payer: Self-pay

## 2021-11-07 DIAGNOSIS — C7972 Secondary malignant neoplasm of left adrenal gland: Secondary | ICD-10-CM | POA: Diagnosis not present

## 2021-11-09 ENCOUNTER — Inpatient Hospital Stay: Payer: Medicaid Other

## 2021-11-10 ENCOUNTER — Other Ambulatory Visit: Payer: Self-pay | Admitting: Family Medicine

## 2021-11-10 DIAGNOSIS — J329 Chronic sinusitis, unspecified: Secondary | ICD-10-CM

## 2021-11-12 ENCOUNTER — Other Ambulatory Visit: Payer: Self-pay

## 2021-11-12 ENCOUNTER — Inpatient Hospital Stay: Payer: Medicaid Other

## 2021-11-12 ENCOUNTER — Ambulatory Visit
Admission: RE | Admit: 2021-11-12 | Discharge: 2021-11-12 | Disposition: A | Discharge status: Home / Self Care | Payer: Medicaid Other | Source: Ambulatory Visit | Attending: Radiation Oncology | Admitting: Radiation Oncology

## 2021-11-12 ENCOUNTER — Telehealth: Payer: Self-pay

## 2021-11-12 DIAGNOSIS — C7972 Secondary malignant neoplasm of left adrenal gland: Secondary | ICD-10-CM | POA: Diagnosis not present

## 2021-11-12 NOTE — Telephone Encounter (Signed)
Please schedule MD only the first week of April and inform pt of appt. Thanks  ?

## 2021-11-12 NOTE — Telephone Encounter (Signed)
-----   Message from Earlie Server, MD sent at 11/11/2021  3:11 PM EDT ----- ?Please have her do a follow up appt with me first week of April. MD only ? ?

## 2021-11-14 ENCOUNTER — Inpatient Hospital Stay: Payer: Medicaid Other

## 2021-11-14 ENCOUNTER — Other Ambulatory Visit: Payer: Self-pay

## 2021-11-14 ENCOUNTER — Ambulatory Visit
Admission: RE | Admit: 2021-11-14 | Discharge: 2021-11-14 | Disposition: A | Discharge status: Home / Self Care | Payer: Medicaid Other | Source: Ambulatory Visit | Attending: Radiation Oncology | Admitting: Radiation Oncology

## 2021-11-14 DIAGNOSIS — C7972 Secondary malignant neoplasm of left adrenal gland: Secondary | ICD-10-CM | POA: Diagnosis not present

## 2021-11-17 ENCOUNTER — Other Ambulatory Visit: Payer: Self-pay | Admitting: Oncology

## 2021-11-19 ENCOUNTER — Ambulatory Visit
Admission: RE | Admit: 2021-11-19 | Discharge: 2021-11-19 | Disposition: A | Discharge status: Home / Self Care | Payer: Medicaid Other | Source: Ambulatory Visit | Attending: Radiation Oncology | Admitting: Radiation Oncology

## 2021-11-19 ENCOUNTER — Encounter: Payer: Self-pay | Admitting: Oncology

## 2021-11-19 DIAGNOSIS — C7972 Secondary malignant neoplasm of left adrenal gland: Secondary | ICD-10-CM | POA: Diagnosis not present

## 2021-11-19 NOTE — Telephone Encounter (Signed)
Component Ref Range & Units 1 mo ago ?(10/09/21) 2 mo ago ?(09/18/21) 2 mo ago ?(08/28/21) 3 mo ago ?(08/06/21) 4 mo ago ?(07/16/21) 4 mo ago ?(07/05/21) 4 mo ago ?(06/25/21)  ?Potassium 3.5 - 5.1 mmol/L 3.7  3.7  3.4 Low   3.6  4.3  3.5  4.0  ? ?

## 2021-12-01 ENCOUNTER — Telehealth: Payer: Self-pay | Admitting: Family Medicine

## 2021-12-01 DIAGNOSIS — I10 Essential (primary) hypertension: Secondary | ICD-10-CM

## 2021-12-03 ENCOUNTER — Encounter: Payer: Self-pay | Admitting: Oncology

## 2021-12-03 NOTE — Telephone Encounter (Signed)
Lvm to have pt call back to schedule an appt ?

## 2021-12-03 NOTE — Telephone Encounter (Signed)
Pt given 30 days needs an appt ?

## 2021-12-04 ENCOUNTER — Encounter: Payer: Self-pay | Admitting: Oncology

## 2021-12-04 ENCOUNTER — Inpatient Hospital Stay (HOSPITAL_BASED_OUTPATIENT_CLINIC_OR_DEPARTMENT_OTHER): Payer: Medicaid Other | Admitting: Oncology

## 2021-12-04 ENCOUNTER — Inpatient Hospital Stay: Payer: Medicaid Other | Attending: Oncology

## 2021-12-04 VITALS — BP 119/76 | HR 74 | Temp 98.7°F | Resp 20 | Wt 148.1 lb

## 2021-12-04 DIAGNOSIS — Z5112 Encounter for antineoplastic immunotherapy: Secondary | ICD-10-CM | POA: Diagnosis present

## 2021-12-04 DIAGNOSIS — Z79899 Other long term (current) drug therapy: Secondary | ICD-10-CM | POA: Insufficient documentation

## 2021-12-04 DIAGNOSIS — T451X5D Adverse effect of antineoplastic and immunosuppressive drugs, subsequent encounter: Secondary | ICD-10-CM | POA: Diagnosis not present

## 2021-12-04 DIAGNOSIS — M21371 Foot drop, right foot: Secondary | ICD-10-CM | POA: Insufficient documentation

## 2021-12-04 DIAGNOSIS — Z8042 Family history of malignant neoplasm of prostate: Secondary | ICD-10-CM | POA: Insufficient documentation

## 2021-12-04 DIAGNOSIS — D6481 Anemia due to antineoplastic chemotherapy: Secondary | ICD-10-CM | POA: Diagnosis not present

## 2021-12-04 DIAGNOSIS — C7931 Secondary malignant neoplasm of brain: Secondary | ICD-10-CM | POA: Diagnosis not present

## 2021-12-04 DIAGNOSIS — G939 Disorder of brain, unspecified: Secondary | ICD-10-CM | POA: Diagnosis not present

## 2021-12-04 DIAGNOSIS — R569 Unspecified convulsions: Secondary | ICD-10-CM | POA: Insufficient documentation

## 2021-12-04 DIAGNOSIS — C3490 Malignant neoplasm of unspecified part of unspecified bronchus or lung: Secondary | ICD-10-CM | POA: Insufficient documentation

## 2021-12-04 DIAGNOSIS — F17210 Nicotine dependence, cigarettes, uncomplicated: Secondary | ICD-10-CM | POA: Diagnosis not present

## 2021-12-04 MED ORDER — HYDROCORTISONE 0.5 % EX CREA: 1.0000 "application " | TOPICAL_CREAM | Freq: Two times a day (BID) | CUTANEOUS | 3 refills | Status: DC | PRN

## 2021-12-04 MED ORDER — LEVETIRACETAM 500 MG PO TABS: 500.0000 mg | ORAL_TABLET | Freq: Two times a day (BID) | ORAL | 0 refills | Status: DC

## 2021-12-04 NOTE — Progress Notes (Signed)
?Hematology/Oncology Progress note ?[phone] [phone] ?  ? ? ? ?Patient Care Team: ?Delsa Grana, PA-C as PCP - General (Family Medicine) ?Telford Nab, RN as Sales executive ?Earlie Server, MD as Consulting Physician (Oncology) ?Noreene Filbert, MD as Consulting Physician (Radiation Oncology) ? ?REFERRING PROVIDER: ?Delsa Grana, PA-C  ?CHIEF COMPLAINTS/REASON FOR VISIT:  ?small cell lung cancer ? ?HISTORY OF PRESENTING ILLNESS:  ? ?Jenna Newton is a  56 y.o.  female with PMH listed below was seen in consultation at the request of  Delsa Grana, PA-C  for evaluation of lung mass ? ?05/08/2021, patient presented to emergency room for evaluation of expressive aphasia.  Acute onset of symptoms.  She/has no other neurological deficits at that time.  In ER, patient was found to have expressive aphasia with intermittent word at rest, though receptive and follows commands. ?MRI brain with and without contrast showed multiple small peripherally enhancing lesions.  Primary differential consideration is metastatic disease.   ?Patient declined being admitted.  Was eventually convinced to stay to do CT chest abdomen pelvis with contrast. ?CT showed a 3.2 cm posterior left upper lobe mass extending from the left hilum, concerning for primary bronchogenic carcinoma.  Enlarged left hilar and prominent bilateral peritracheal lymph nodes are identified.  Worrisome for nodal metastasis.  Similar appearance of bilateral adrenal nodule compared with 12/17/2013.  Favor benign adenomas.  Coronary artery calcifications, enlarged fibroid uterus. ?Patient prefers to be discharged home.  She was sent on dexamethasone 4 mg twice daily.  And Keppra 500 mg twice daily. ? ?Patient was referred to establish care with cancer center. ?Today she has no difficulty in speech.  She was accompanied by her cousin. ?She has some gait change with right foot drop. ?No additional episodes of expressive aphasia. ? ?Patient  drinks alcohol, 12 packs of beer /week, current every day smoker.  ? ?05/25/2021 left upper lobe biopsy via bronchoscopy showed small cell lung cancer. ?left upper lobe brushing/ LUB lavage + small cell lung cancer,  Precarinal lymph node biopsy- not enough tissue ? ?Patient has baseline alopecia ?05/30/2021- 06/13/2021 palliative brain RT.  ?06/04/2021 palliative chemotherapy treatments with carboplatin etoposide ?06/25/2021, cycle 2 carboplatin etoposide and Tecentriq. ?Continued on carboplatin etoposide Tecentriq. ?Interim PET scan after 4 cycles of chemo was not approved by her insurance.  CT scan was obtained after 5 cycles of chemotherapy. ? ?09/12/2021 CT chest abdomen pelvis with contrast was reviewed and discussed with patient ?Mixed response.Decrease of posterior left upper lobe mass, unchanged pretracheal and left hilar lymph nodes.  Interval enlargement of lateral limb left adrenal nodule, ?Unchanged right adrenal body.  No evidence of new metastasis in the chest abdomen pelvis. ?Mixed response. ? ?10/18/2021, PET scan  ?Signs of known small cell lung cancer with diminished size of LEFT upper lobe nodule showing persistent disease in the mediastinum and in the LEFT adrenal. ?  ?Evidence of central abdominal lymph node showing slow increase in size over time with increased metabolic activity as well, though an atypical site for lung cancer metastasis, in the setting of small cell lung cancer this is favored at this time. ?  ?LEFT parotid lesion that may be either benign or malignant with very low level metabolic activity, consider focused ultrasound for further assessment. ?Aortic Atherosclerosis (ICD10-I70.0) and Emphysema  ? ?INTERVAL HISTORY ?Jenna Newton is a 56 y.o. female who has above history reviewed by me today presents for follow up visit for management of extensive small cell lung cancer ?Status post 7 cycles  of carboplatin/etoposide/Tecentriq ?Status post palliative radiation to adrenal  gland. ?Patient has appointment with Dr. Baruch Gouty at the end of April 2023 for additional SBRT to thoracic lymph nodes. ?Patient reports feeling well.  Appetite is good.  Weight is stable. ? ? ?Review of Systems  ?Constitutional:  Positive for fatigue. Negative for appetite change, chills and fever.  ?HENT:   Negative for hearing loss and voice change.   ?     Alopecia-chronic  ?Eyes:  Negative for eye problems.  ?Respiratory:  Negative for chest tightness and cough.   ?Cardiovascular:  Negative for chest pain.  ?Gastrointestinal:  Negative for abdominal distention, abdominal pain and blood in stool.  ?Endocrine: Negative for hot flashes.  ?Genitourinary:  Negative for difficulty urinating and frequency.   ?Musculoskeletal:  Negative for arthralgias and gait problem.  ?Skin:  Negative for itching and rash.  ?Neurological:  Negative for extremity weakness, gait problem, headaches, seizures and speech difficulty.  ?Hematological:  Negative for adenopathy.  ?Psychiatric/Behavioral:  Negative for confusion.   ? ?MEDICAL HISTORY:  ?Past Medical History:  ?Diagnosis Date  ? Anemia   ? GERD (gastroesophageal reflux disease)   ? Hypertension   ? Neuropathy   ? Small cell lung cancer ([place])   ? Tobacco abuse 03/14/2017  ? Vertigo   ? White matter disease of brain due to ischemia 12/04/2017  ? Head CT March 2019  ? ? ?SURGICAL HISTORY: ?Past Surgical History:  ?Procedure Laterality Date  ? none    ? PORTA CATH INSERTION N/A 06/18/2021  ? Procedure: PORTA CATH INSERTION;  Surgeon: Algernon Huxley, MD;  Location: [facility];  Service: Cardiovascular;  Laterality: N/A;  ? VIDEO BRONCHOSCOPY WITH ENDOBRONCHIAL NAVIGATION N/A 05/25/2021  ? Procedure: ROBOTIC ASSISTED VIDEO BRONCHOSCOPY WITH ENDOBRONCHIAL NAVIGATION;  Surgeon: Tyler Pita, MD;  Location: [facility];  Service: Pulmonary;  Laterality: N/A;  ? VIDEO BRONCHOSCOPY WITH ENDOBRONCHIAL ULTRASOUND N/A 05/25/2021  ? Procedure: VIDEO BRONCHOSCOPY WITH ENDOBRONCHIAL  ULTRASOUND;  Surgeon: Tyler Pita, MD;  Location: [facility];  Service: Pulmonary;  Laterality: N/A;  ? ? ?SOCIAL HISTORY: ?Social History  ? ?Socioeconomic History  ? Marital status: Single  ?  Spouse name: Not on file  ? Number of children: 0  ? Years of education: Not on file  ? Highest education level: Bachelor's degree (e.g., BA, AB, BS)  ?Occupational History  ? Not on file  ?Tobacco Use  ? Smoking status: Every Day  ?  Packs/day: 1.00  ?  Years: 25.00  ?  Pack years: 25.00  ?  Types: Cigarettes  ? Smokeless tobacco: Never  ?Vaping Use  ? Vaping Use: Never used  ?Substance and Sexual Activity  ? Alcohol use: Yes  ?  Alcohol/week: 14.0 standard drinks  ?  Types: 14 Cans of beer per week  ?  Comment: 12 pack/ week  ? Drug use: No  ? Sexual activity: Yes  ?  Partners: Male  ?  Birth control/protection: Post-menopausal  ?Other Topics Concern  ? Not on file  ?Social History Narrative  ? Lives with brother  ? ?Social Determinants of Health  ? ?Financial Resource Strain: Not on file  ?Food Insecurity: Food Insecurity Present  ? Worried About Charity fundraiser in the Last Year: Sometimes true  ? Ran Out of Food in the Last Year: Sometimes true  ?Transportation Needs: Not on file  ?Physical Activity: Not on file  ?Stress: Not on file  ?Social Connections: Not on file  ?  Intimate Partner Violence: Not on file  ? ? ?FAMILY HISTORY: ?Family History  ?Problem Relation Age of Onset  ? Diabetes Mother   ? Hypertension Mother   ? Emphysema Mother   ? Emphysema Father   ? Diabetes Brother   ? Alcohol abuse Brother   ? Diabetes Maternal Grandmother   ? Hypertension Maternal Grandmother   ? Blindness Maternal Grandmother   ? Cancer Maternal Grandfather   ?     prostate  ? Emphysema Maternal Grandfather   ? Cancer Paternal Grandmother   ? Emphysema Paternal Grandfather   ? ? ?ALLERGIES:  is allergic to penicillins, meclizine, and sulfa antibiotics. ? ?MEDICATIONS:  ?Current Outpatient Medications  ?Medication Sig Dispense  Refill  ? amLODipine (NORVASC) 5 MG tablet TAKE 1 TABLET BY MOUTH EVERY DAY 30 tablet 0  ? atorvastatin (LIPITOR) 40 MG tablet Take 40 mg by mouth at bedtime. 1 Tablet(s) By Mouth Every Night    ? cromolyn (OPTICROM

## 2021-12-06 ENCOUNTER — Ambulatory Visit: Payer: Medicaid Other | Admitting: Family Medicine

## 2021-12-06 ENCOUNTER — Encounter: Payer: Self-pay | Admitting: Family Medicine

## 2021-12-06 VITALS — BP 102/60 | HR 99 | Temp 97.6°F | Resp 16 | Ht 66.0 in | Wt 145.2 lb

## 2021-12-06 DIAGNOSIS — E785 Hyperlipidemia, unspecified: Secondary | ICD-10-CM | POA: Diagnosis not present

## 2021-12-06 DIAGNOSIS — I10 Essential (primary) hypertension: Secondary | ICD-10-CM

## 2021-12-06 DIAGNOSIS — Z23 Encounter for immunization: Secondary | ICD-10-CM

## 2021-12-06 DIAGNOSIS — C3490 Malignant neoplasm of unspecified part of unspecified bronchus or lung: Secondary | ICD-10-CM

## 2021-12-06 DIAGNOSIS — Z1159 Encounter for screening for other viral diseases: Secondary | ICD-10-CM | POA: Diagnosis not present

## 2021-12-06 DIAGNOSIS — Z1231 Encounter for screening mammogram for malignant neoplasm of breast: Secondary | ICD-10-CM

## 2021-12-06 DIAGNOSIS — Z1211 Encounter for screening for malignant neoplasm of colon: Secondary | ICD-10-CM

## 2021-12-06 MED ORDER — AMLODIPINE BESYLATE 2.5 MG PO TABS: 2.5000 mg | ORAL_TABLET | Freq: Every day | ORAL | 1 refills | Status: DC

## 2021-12-06 NOTE — Progress Notes (Signed)
? ?Name: Jenna Newton   MRN: [id number]    DOB: [birth year]   Date:12/06/2021 ? ?     Progress Note ? ?Chief Complaint  ?Patient presents with  ? Follow-up  ? Hyperlipidemia  ? Hypertension  ? ? ? ?Subjective:  ? ?Jenna Newton is a 56 y.o. female, presents to clinic for routine f/up ? ?Hypertension:   BP trending slightly lower than her normal, but at goal ?Currently managed on amlodipine 5 mg  ?Pt reports good med compliance and denies any SE.   ?Blood pressure today is well controlled. ?BP Readings from Last 10 Encounters:  ?12/06/21 102/60  ?12/04/21 119/76  ?10/18/21 (!) 154/66  ?10/11/21 120/66  ?10/10/21 112/66  ?10/09/21 124/66  ?10/09/21 118/74  ?09/20/21 117/90  ?09/19/21 121/63  ?09/18/21 127/79  ? ?Pt denies CP, SOB, exertional sx, LE edema, palpitation, Ha's, visual disturbances, lightheadedness, hypotension, syncope. ? ?Hyperlipidemia: ?Currently treated with lipitor 40 , pt reports good med compliance ?Last Lipids: ?Lab Results  ?Component Value Date  ? CHOL 144 12/06/2019  ? HDL 33 (L) 12/06/2019  ? [place] 85 12/06/2019  ? TRIG 159 (H) 12/06/2019  ? CHOLHDL 4.4 12/06/2019  ? ?- Denies: Chest pain, shortness of breath, myalgias, claudication ? ?Allergies - seasonal, nasal,  ? ?Small cell lung CA- stage 4 presented Last sept to er with stroke like sx, brain mets found, also has chemo induced N/V - managed by Dr. Tasia Catchings ?On a few antiemetics ?Having some progression of disease after some initial response ? ?ASSESSMENT & PLAN:  ?1. Small cell lung cancer ([place])   ?2. Encounter for antineoplastic immunotherapy   ?3. Seizure ([place])   ?4. Brain lesion   ? Cancer Staging  ?Small cell lung cancer ([place]) ?Staging form: Lung, AJCC 8th Edition ?- Clinical stage from 06/01/2021: Stage IV (cT2a, cN3, cM1) - Signed by Earlie Server, MD on 06/01/2021 ?  ?# Extensive small cell cancer, brain metastatic disease ?Palliative chemotherapy carboplatin + etoposide and Tecentriq., and s/p palliative brain radiation. ?Status post 7  cycles of carboplatin/etoposide/Tecentriq. ?Status post radiation of the left adrenal. ?We will schedule patient to get labs CBC CMP TSH free T4 and proceed with maintenance Tecentriq this week. ?She has a follow-up appointment with radiation oncology Dr. Cheron Schaumann at the end of April 2023 for additional SBRT to mediastinum lymphadenopathy. ?  ?  ?# Brain metastasis and seizure ?S/p RT, s/p steroid taper.  Continue Keppra 500mg  BID. Off dexamethasone now. ? ? ? ?Current Outpatient Medications:  ?  amLODipine (NORVASC) 5 MG tablet, TAKE 1 TABLET BY MOUTH EVERY DAY, Disp: 30 tablet, Rfl: 0 ?  atorvastatin (LIPITOR) 40 MG tablet, Take 40 mg by mouth at bedtime. 1 Tablet(s) By Mouth Every Night, Disp: , Rfl:  ?  cromolyn (OPTICROM) 4 % ophthalmic solution, Place 1 drop into both eyes 4 (four) times daily as needed., Disp: 10 mL, Rfl: 3 ?  hydrocortisone cream 0.5 %, Apply 1 application. topically 2 (two) times daily as needed for itching., Disp: 30 g, Rfl: 3 ?  KLOR-CON M20 20 MEQ tablet, TAKE 1 TABLET BY MOUTH EVERY DAY, Disp: 30 tablet, Rfl: 1 ?  levETIRAcetam (KEPPRA) 500 MG tablet, Take 1 tablet (500 mg total) by mouth 2 (two) times daily., Disp: 120 tablet, Rfl: 0 ?  lidocaine-prilocaine (EMLA) cream, Apply 1 application topically as needed., Disp: 30 g, Rfl: 3 ?  montelukast (SINGULAIR) 10 MG tablet, Take 1 tablet (10 mg total) by mouth at bedtime., Disp: 90 tablet, Rfl: 3 ?  ondansetron (ZOFRAN) 8 MG tablet, Take 1 tablet (8 mg total) by mouth 2 (two) times daily as needed for refractory nausea / vomiting. Start on day 3 after carboplatin chemo., Disp: 30 tablet, Rfl: 1 ?  prochlorperazine (COMPAZINE) 10 MG tablet, Take 1 tablet (10 mg total) by mouth every 6 (six) hours as needed (Nausea or vomiting)., Disp: 30 tablet, Rfl: 1 ?  saline (AYR) GEL, Place 1 application into the nose every 4 (four) hours., Disp: 14.1 g, Rfl: 3 ?  Tiotropium Bromide-Olodaterol (STIOLTO RESPIMAT) 2.5-2.5 MCG/ACT AERS, Inhale 2 puffs  into the lungs daily., Disp: 1 each, Rfl: 0 ?  vitamin B-12 (CYANOCOBALAMIN) 500 MCG tablet, Take 1 tablet (500 mcg total) by mouth daily., Disp: 100 tablet, Rfl: 3 ?  dexamethasone (DECADRON) 1 MG tablet, Take 1 tablet (1 mg total) by mouth See admin instructions. Take 3mg  daily for 1 week and then take 2mg  daily for 1 week. (Patient not taking: Reported on 10/09/2021), Disp: 35 tablet, Rfl: 0 ?  dexamethasone (DECADRON) 1 MG tablet, Take 1 tablet (1 mg total) by mouth daily. (Patient not taking: Reported on 10/09/2021), Disp: 14 tablet, Rfl: 0 ?  fluticasone (FLONASE) 50 MCG/ACT nasal spray, Place 1 spray into both nostrils daily as needed for allergies. (Patient not taking: Reported on 12/06/2021), Disp: , Rfl:  ? ?Patient Active Problem List  ? Diagnosis Date Noted  ? Encounter for antineoplastic immunotherapy 12/04/2021  ? Encounter for antineoplastic chemotherapy 06/01/2021  ? Seizure ([place]) 06/01/2021  ? Small cell lung cancer ([place]) 05/28/2021  ? Brain lesion 05/14/2021  ? Lung mass 05/14/2021  ? White matter disease of brain due to ischemia 12/04/2017  ? Alcohol use 12/04/2017  ? Goals of care, counseling/discussion 04/18/2017  ? Benign hypertension 04/18/2017  ? Dyslipidemia 04/18/2017  ? Dry mouth 03/14/2017  ? Neuropathy 03/14/2017  ? Tobacco abuse 03/14/2017  ? Mononeuritis 05/12/2014  ? Numbness of lower extremity 05/12/2014  ? Right foot drop 05/12/2014  ? Weakness 05/12/2014  ? ? ?Past Surgical History:  ?Procedure Laterality Date  ? none    ? PORTA CATH INSERTION N/A 06/18/2021  ? Procedure: PORTA CATH INSERTION;  Surgeon: Algernon Huxley, MD;  Location: [facility];  Service: Cardiovascular;  Laterality: N/A;  ? VIDEO BRONCHOSCOPY WITH ENDOBRONCHIAL NAVIGATION N/A 05/25/2021  ? Procedure: ROBOTIC ASSISTED VIDEO BRONCHOSCOPY WITH ENDOBRONCHIAL NAVIGATION;  Surgeon: Tyler Pita, MD;  Location: [facility];  Service: Pulmonary;  Laterality: N/A;  ? VIDEO BRONCHOSCOPY WITH ENDOBRONCHIAL ULTRASOUND N/A  05/25/2021  ? Procedure: VIDEO BRONCHOSCOPY WITH ENDOBRONCHIAL ULTRASOUND;  Surgeon: Tyler Pita, MD;  Location: [facility];  Service: Pulmonary;  Laterality: N/A;  ? ? ?Family History  ?Problem Relation Age of Onset  ? Diabetes Mother   ? Hypertension Mother   ? Emphysema Mother   ? Emphysema Father   ? Diabetes Brother   ? Alcohol abuse Brother   ? Diabetes Maternal Grandmother   ? Hypertension Maternal Grandmother   ? Blindness Maternal Grandmother   ? Cancer Maternal Grandfather   ?     prostate  ? Emphysema Maternal Grandfather   ? Cancer Paternal Grandmother   ? Emphysema Paternal Grandfather   ? ? ?Social History  ? ?Tobacco Use  ? Smoking status: Every Day  ?  Packs/day: 1.00  ?  Years: 25.00  ?  Pack years: 25.00  ?  Types: Cigarettes  ? Smokeless tobacco: Never  ?Vaping Use  ? Vaping Use: Never used  ?Substance  Use Topics  ? Alcohol use: Yes  ?  Alcohol/week: 14.0 standard drinks  ?  Types: 14 Cans of beer per week  ?  Comment: 12 pack/ week  ? Drug use: No  ?  ? ?Allergies  ?Allergen Reactions  ? Penicillins Swelling  ? Meclizine Other (See Comments)  ?  Gave pt headaches   ? Sulfa Antibiotics Swelling  ? ? ?Health Maintenance  ?Topic Date Due  ? Hepatitis C Screening  Never done  ? PAP SMEAR-Modifier  10/07/2021  ? COVID-19 Vaccine (3 - Moderna risk series) 12/22/2021 (Originally 07/10/2020)  ? Zoster Vaccines- Shingrix (1 of 2) 03/07/2022 (Originally 03/11/1984)  ? MAMMOGRAM  12/07/2022 (Originally 10/08/2019)  ? COLONOSCOPY (Pts 45-43yrs Insurance coverage will need to be confirmed)  12/07/2022 (Originally 03/11/2010)  ? TETANUS/TDAP  12/07/2022 (Originally 09/02/2018)  ? INFLUENZA VACCINE  04/02/2022  ? HIV Screening  Completed  ? HPV VACCINES  Aged Out  ? ? ?Chart Review Today: ?I personally reviewed active problem list, medication list, allergies, family history, social history, health maintenance, notes from last encounter, lab results, imaging with the patient/caregiver today. ? ? ?Review of Systems   ?10 Systems reviewed and are negative for acute change except as noted in the HPI. ? ?Objective:  ? ?Vitals:  ? 12/06/21 1022  ?BP: 102/60  ?Pulse: 99  ?Resp: 16  ?Temp: 97.6 ?F (36.4 ?C)  ?TempSrc: Oral  ?

## 2021-12-07 ENCOUNTER — Inpatient Hospital Stay: Payer: Medicaid Other

## 2021-12-07 ENCOUNTER — Other Ambulatory Visit: Payer: Self-pay | Admitting: Oncology

## 2021-12-07 ENCOUNTER — Other Ambulatory Visit: Payer: Self-pay | Admitting: Family Medicine

## 2021-12-07 ENCOUNTER — Other Ambulatory Visit: Payer: Self-pay

## 2021-12-07 VITALS — BP 104/82 | HR 85 | Temp 97.9°F | Resp 18 | Ht 66.0 in | Wt 148.5 lb

## 2021-12-07 DIAGNOSIS — C3490 Malignant neoplasm of unspecified part of unspecified bronchus or lung: Secondary | ICD-10-CM

## 2021-12-07 DIAGNOSIS — J329 Chronic sinusitis, unspecified: Secondary | ICD-10-CM

## 2021-12-07 DIAGNOSIS — Z5112 Encounter for antineoplastic immunotherapy: Secondary | ICD-10-CM | POA: Diagnosis not present

## 2021-12-07 LAB — CBC WITH DIFFERENTIAL/PLATELET
Abs Immature Granulocytes: 0.01 10*3/uL (ref 0.00–0.07)
Basophils Absolute: 0 10*3/uL (ref 0.0–0.1)
Basophils Relative: 1 %
Eosinophils Absolute: 0.1 10*3/uL (ref 0.0–0.5)
Eosinophils Relative: 1 %
HCT: 35.5 % — ABNORMAL LOW (ref 36.0–46.0)
Hemoglobin: 11.6 g/dL — ABNORMAL LOW (ref 12.0–15.0)
Immature Granulocytes: 0 %
Lymphocytes Relative: 25 %
Lymphs Abs: 1.3 10*3/uL (ref 0.7–4.0)
MCH: 29.6 pg (ref 26.0–34.0)
MCHC: 32.7 g/dL (ref 30.0–36.0)
MCV: 90.6 fL (ref 80.0–100.0)
Monocytes Absolute: 0.5 10*3/uL (ref 0.1–1.0)
Monocytes Relative: 10 %
Neutro Abs: 3.3 10*3/uL (ref 1.7–7.7)
Neutrophils Relative %: 63 %
Platelets: 221 10*3/uL (ref 150–400)
RBC: 3.92 MIL/uL (ref 3.87–5.11)
RDW: 16.4 % — ABNORMAL HIGH (ref 11.5–15.5)
WBC: 5.3 10*3/uL (ref 4.0–10.5)
nRBC: 0 % (ref 0.0–0.2)

## 2021-12-07 LAB — TSH: TSH: 0.089 u[IU]/mL — ABNORMAL LOW (ref 0.350–4.500)

## 2021-12-07 LAB — COMPREHENSIVE METABOLIC PANEL
ALT: 17 U/L (ref 0–44)
AST: 18 U/L (ref 15–41)
Albumin: 3.8 g/dL (ref 3.5–5.0)
Alkaline Phosphatase: 83 U/L (ref 38–126)
Anion gap: 7 (ref 5–15)
BUN: 8 mg/dL (ref 6–20)
CO2: 26 mmol/L (ref 22–32)
Calcium: 9 mg/dL (ref 8.9–10.3)
Chloride: 102 mmol/L (ref 98–111)
Creatinine, Ser: 0.44 mg/dL (ref 0.44–1.00)
GFR, Estimated: >60 mL/min (ref >60)
Glucose, Bld: 92 mg/dL (ref 70–99)
Potassium: 3.8 mmol/L (ref 3.5–5.1)
Sodium: 135 mmol/L (ref 135–145)
Total Bilirubin: 0.3 mg/dL (ref 0.3–1.2)
Total Protein: 7 g/dL (ref 6.5–8.1)

## 2021-12-07 LAB — LIPID PANEL
Cholesterol: 121 mg/dL (ref 0–200)
HDL: 27 mg/dL — ABNORMAL LOW (ref >40)
LDL Cholesterol: 53 mg/dL (ref 0–99)
Total CHOL/HDL Ratio: 4.5 RATIO
Triglycerides: 207 mg/dL — ABNORMAL HIGH (ref <150)
VLDL: 41 mg/dL — ABNORMAL HIGH (ref 0–40)

## 2021-12-07 LAB — T4, FREE: Free T4: 1.5 ng/dL — ABNORMAL HIGH (ref 0.61–1.12)

## 2021-12-07 MED ORDER — HEPARIN SOD (PORK) LOCK FLUSH 100 UNIT/ML IV SOLN
500.0000 [IU] | Freq: Once | INTRAVENOUS | Status: AC | PRN
  Administered 2021-12-07: 500 [IU]
  Filled 2021-12-07: qty 5

## 2021-12-07 MED ORDER — SODIUM CHLORIDE 0.9 % IV SOLN
1200.0000 mg | Freq: Once | INTRAVENOUS | Status: AC
  Administered 2021-12-07: 1200 mg via INTRAVENOUS
  Filled 2021-12-07: qty 20

## 2021-12-07 MED ORDER — SODIUM CHLORIDE 0.9 % IV SOLN
Freq: Once | INTRAVENOUS | Status: AC
  Filled 2021-12-07: qty 250

## 2021-12-07 NOTE — Telephone Encounter (Signed)
Component Ref Range & Units 08:58 ?(12/07/21) 1 mo ago ?(10/09/21) 2 mo ago ?(09/18/21) 3 mo ago ?(08/28/21) 4 mo ago ?(08/06/21) 4 mo ago ?(07/16/21) 5 mo ago ?(07/05/21)  ?Potassium 3.5 - 5.1 mmol/L 3.8  3.7  3.7  3.4 Low   3.6  4.3  3.5   ? ?

## 2021-12-07 NOTE — Patient Instructions (Signed)
[facility] [place]  Discharge Instructions: ?Thank you for choosing [facility] to provide your oncology and hematology care.  ?If you have a lab appointment with the [facility], please go directly to the [facility] and check in at the registration area. ? ?Wear comfortable clothing and clothing appropriate for easy access to any Portacath or PICC line.  ? ?We strive to give you quality time with your provider. You may need to reschedule your appointment if you arrive late (15 or more minutes).  Arriving late affects you and other patients whose appointments are after yours.  Also, if you miss three or more appointments without notifying the office, you may be dismissed from the clinic at the provider?s discretion.    ?  ?For prescription refill requests, have your pharmacy contact our office and allow 72 hours for refills to be completed.   ? ?Today you received the following chemotherapy and/or immunotherapy agents TECENTRIQ ?    ?  ?To help prevent nausea and vomiting after your treatment, we encourage you to take your nausea medication as directed. ? ?BELOW ARE SYMPTOMS THAT SHOULD BE REPORTED IMMEDIATELY: ?*FEVER GREATER THAN 100.4 F (38 ?C) OR HIGHER ?*CHILLS OR SWEATING ?*NAUSEA AND VOMITING THAT IS NOT CONTROLLED WITH YOUR NAUSEA MEDICATION ?*UNUSUAL SHORTNESS OF BREATH ?*UNUSUAL BRUISING OR BLEEDING ?*URINARY PROBLEMS (pain or burning when urinating, or frequent urination) ?*BOWEL PROBLEMS (unusual diarrhea, constipation, pain near the anus) ?TENDERNESS IN MOUTH AND THROAT WITH OR WITHOUT PRESENCE OF ULCERS (sore throat, sores in mouth, or a toothache) ?UNUSUAL RASH, SWELLING OR PAIN  ?UNUSUAL VAGINAL DISCHARGE OR ITCHING  ? ?Items with * indicate a potential emergency and should be followed up as soon as possible or go to the Emergency Department if any problems should occur. ? ?Please show the CHEMOTHERAPY ALERT CARD or IMMUNOTHERAPY ALERT CARD at check-in to  the Emergency Department and triage nurse. ? ?Should you have questions after your visit or need to cancel or reschedule your appointment, please contact [facility] [place]  [phone] and follow the prompts.  Office hours are 8:00 a.m. to 4:30 p.m. Monday - Friday. Please note that voicemails left after 4:00 p.m. may not be returned until the following business day.  We are closed weekends and major holidays. You have access to a nurse at all times for urgent questions. Please call the main number to the clinic [phone] and follow the prompts. ? ?For any non-urgent questions, you may also contact your provider using MyChart. We now offer e-Visits for anyone 76 and older to request care online for non-urgent symptoms. For details visit [email or web address]. ?  ?Also download the MyChart app! Go to the app store, search "MyChart", open the app, select [facility], and log in with your MyChart username and password. ? ?Due to Covid, a mask is required upon entering the hospital/clinic. If you do not have a mask, one will be given to you upon arrival. For doctor visits, patients may have 1 support person aged 68 or older with them. For treatment visits, patients cannot have anyone with them due to current Covid guidelines and our immunocompromised population.  ? ?Atezolizumab injection ?What is this medication? ?ATEZOLIZUMAB (a te zoe LIZ ue mab) is a monoclonal antibody. It is used to treat bladder cancer (urothelial cancer), liver cancer, lung cancer, and melanoma. ?This medicine may be used for other purposes; ask your health care provider or pharmacist if you have questions. ?COMMON BRAND NAME(S):  Tecentriq ?What should I tell my care team before I take this medication? ?They need to know if you have any of these conditions: ?autoimmune diseases like Crohn's disease, ulcerative colitis, or lupus ?have had or planning to have an allogeneic stem cell transplant (uses someone else's  stem cells) ?history of organ transplant ?history of radiation to the chest ?nervous system problems like myasthenia gravis or Guillain-Barre syndrome ?an unusual or allergic reaction to atezolizumab, other medicines, foods, dyes, or preservatives ?pregnant or trying to get pregnant ?breast-feeding ?How should I use this medication? ?This medicine is for infusion into a vein. It is given by a health care professional in a hospital or clinic setting. ?A special MedGuide will be given to you before each treatment. Be sure to read this information carefully each time. ?Talk to your pediatrician regarding the use of this medicine in children. Special care may be needed. ?Overdosage: If you think you have taken too much of this medicine contact a poison control center or emergency room at once. ?NOTE: This medicine is only for you. Do not share this medicine with others. ?What if I miss a dose? ?It is important not to miss your dose. Call your doctor or health care professional if you are unable to keep an appointment. ?What may interact with this medication? ?Interactions have not been studied. ?This list may not describe all possible interactions. Give your health care provider a list of all the medicines, herbs, non-prescription drugs, or dietary supplements you use. Also tell them if you smoke, drink alcohol, or use illegal drugs. Some items may interact with your medicine. ?What should I watch for while using this medication? ?Your condition will be monitored carefully while you are receiving this medicine. ?You may need blood work done while you are taking this medicine. ?Do not become pregnant while taking this medicine or for at least 5 months after stopping it. Women should inform their doctor if they wish to become pregnant or think they might be pregnant. There is a potential for serious side effects to an unborn child. Talk to your health care professional or pharmacist for more information. Do not  breast-feed an infant while taking this medicine or for at least 5 months after the last dose. ?What side effects may I notice from receiving this medication? ?Side effects that you should report to your doctor or health care professional as soon as possible: ?allergic reactions like skin rash, itching or hives, swelling of the face, lips, or tongue ?black, tarry stools ?bloody or watery diarrhea ?breathing problems ?changes in vision ?chest pain or chest tightness ?chills ?facial flushing ?fever ?headache ?signs and symptoms of high blood sugar such as dizziness; dry mouth; dry skin; fruity breath; nausea; stomach pain; increased hunger or thirst; increased urination ?signs and symptoms of liver injury like dark yellow or brown urine; general ill feeling or flu-like symptoms; light-colored stools; loss of appetite; nausea; right upper belly pain; unusually weak or tired; yellowing of the eyes or skin ?stomach pain ?trouble passing urine or change in the amount of urine ?Side effects that usually do not require medical attention (report to your doctor or health care professional if they continue or are bothersome): ?bone pain ?cough ?diarrhea ?joint pain ?muscle pain ?muscle weakness ?swelling of arms or legs ?tiredness ?weight loss ?This list may not describe all possible side effects. Call your doctor for medical advice about side effects. You may report side effects to FDA at 1-800-FDA-1088. ?Where should I keep my medication? ?This  drug is given in a hospital or clinic and will not be stored at home. ?NOTE: This sheet is a summary. It may not cover all possible information. If you have questions about this medicine, talk to your doctor, pharmacist, or health care provider. ?? 2022 Elsevier/Gold Standard (2021-05-08 00:00:00) ? ?

## 2021-12-09 ENCOUNTER — Encounter: Payer: Self-pay | Admitting: Oncology

## 2021-12-10 ENCOUNTER — Other Ambulatory Visit: Payer: Self-pay

## 2021-12-17 MED ORDER — ATORVASTATIN CALCIUM 20 MG PO TABS: 20.0000 mg | ORAL_TABLET | Freq: Every day | ORAL | 1 refills | Status: DC

## 2021-12-23 ENCOUNTER — Other Ambulatory Visit: Payer: Self-pay | Admitting: Oncology

## 2021-12-24 NOTE — Telephone Encounter (Signed)
Component Ref Range & Units 2 wk ago ?(12/07/21) 2 mo ago ?(10/09/21) 3 mo ago ?(09/18/21) 3 mo ago ?(08/28/21) 4 mo ago ?(08/06/21) 5 mo ago ?(07/16/21) 5 mo ago ?(07/05/21)  ?Potassium 3.5 - 5.1 mmol/L 3.8  3.7  3.7  3.4 Low   3.6  4.3  3.5   ? ?

## 2021-12-26 ENCOUNTER — Inpatient Hospital Stay: Payer: Medicaid Other

## 2021-12-26 ENCOUNTER — Encounter: Payer: Self-pay | Admitting: Oncology

## 2021-12-26 ENCOUNTER — Encounter: Payer: Self-pay | Admitting: Radiation Oncology

## 2021-12-26 ENCOUNTER — Ambulatory Visit
Admission: RE | Admit: 2021-12-26 | Discharge: 2021-12-26 | Disposition: A | Discharge status: Home / Self Care | Payer: Medicaid Other | Source: Ambulatory Visit | Attending: Radiation Oncology | Admitting: Radiation Oncology

## 2021-12-26 VITALS — BP 126/68 | HR 85 | Resp 18 | Ht 66.0 in | Wt 145.3 lb

## 2021-12-26 DIAGNOSIS — Z923 Personal history of irradiation: Secondary | ICD-10-CM | POA: Diagnosis not present

## 2021-12-26 DIAGNOSIS — C3412 Malignant neoplasm of upper lobe, left bronchus or lung: Secondary | ICD-10-CM | POA: Diagnosis not present

## 2021-12-26 DIAGNOSIS — C3490 Malignant neoplasm of unspecified part of unspecified bronchus or lung: Secondary | ICD-10-CM

## 2021-12-26 DIAGNOSIS — C7931 Secondary malignant neoplasm of brain: Secondary | ICD-10-CM | POA: Diagnosis not present

## 2021-12-26 DIAGNOSIS — C7972 Secondary malignant neoplasm of left adrenal gland: Secondary | ICD-10-CM | POA: Insufficient documentation

## 2021-12-26 DIAGNOSIS — C781 Secondary malignant neoplasm of mediastinum: Secondary | ICD-10-CM | POA: Diagnosis not present

## 2021-12-26 NOTE — Progress Notes (Signed)
Radiation Oncology ?Follow up Note ? ?Name: Jenna Newton   ?Date:   12/26/2021 ?MRN:  [id number] ?DOB: 17-Aug-1966  ? ? ?This 56 y.o. female presents to the clinic today for 1 month out from SBRT to her left adrenal metastasis and patient with known extensive stage small cell lung cancer. ? ?REFERRING PROVIDER: Delsa Grana, PA-C ? ?HPI: Patient is a 56 year old female now at 1 month having completed radiation therapy to her left adrenal gland for metastatic small cell lung cancer.  She has known extensive stage disease previously treated with whole brain radiation therapy.  Her most recent PET scan also showed some hypermetabolic activity in mediastinal nodes.  Showing persistent disease.  She is seen today her pain in her abdomen is completely diminished.  She specifically [place] cough hemoptysis chest tightness or any dysphagia.  She is seen today for discussion of treatment to her mediastinal residual disease. ? ?COMPLICATIONS OF TREATMENT: none ? ?FOLLOW UP COMPLIANCE: keeps appointments  ? ?PHYSICAL EXAM:  ?BP 126/68 (BP Location: Left Arm, Patient Position: Sitting)   Pulse 85   Resp 18   Ht 5\' 6"  (1.676 m)   Wt 145 lb 4.8 oz (65.9 kg)   LMP 01/23/2015   BMI 23.45 kg/m?  ?Well-developed well-nourished patient in NAD. HEENT reveals PERLA, EOMI, discs not visualized.  Oral cavity is clear. No oral mucosal lesions are identified. Neck is clear without evidence of cervical or supraclavicular adenopathy. Lungs are clear to A&P. Cardiac examination is essentially unremarkable with regular rate and rhythm without murmur rub or thrill. Abdomen is benign with no organomegaly or masses noted. Motor sensory and DTR levels are equal and symmetric in the upper and lower extremities. Cranial nerves II through XII are grossly intact. Proprioception is intact. No peripheral adenopathy or edema is identified. No motor or sensory levels are noted. Crude visual fields are within normal range. ? ?RADIOLOGY RESULTS: PET CT scan  reviewed compatible with above-stated findings ? ?PLAN: At this time elect to go ahead with palliative radiation therapy to her mediastinum.  We will use PET/CT fusion study and treat up to 40 Gray in 20 fractions.  Risks and benefits of treatment occluding possible dysphagia from radiation esophagitis fatigue alteration of blood counts skin reaction development of cough all were discussed in detail with the patient.  I have personally set up and ordered CT simulation for early next week.  Patient comprehends my recommendations well. ? ?I would like to take this opportunity to thank you for allowing me to participate in the care of your patient.. ?  ? Noreene Filbert, MD ? ?

## 2021-12-31 ENCOUNTER — Inpatient Hospital Stay: Payer: Medicaid Other

## 2021-12-31 ENCOUNTER — Ambulatory Visit
Admission: RE | Admit: 2021-12-31 | Discharge: 2021-12-31 | Disposition: A | Discharge status: Home / Self Care | Payer: Medicaid Other | Source: Ambulatory Visit | Attending: Radiation Oncology | Admitting: Radiation Oncology

## 2021-12-31 DIAGNOSIS — C3490 Malignant neoplasm of unspecified part of unspecified bronchus or lung: Secondary | ICD-10-CM | POA: Diagnosis present

## 2021-12-31 DIAGNOSIS — C7972 Secondary malignant neoplasm of left adrenal gland: Secondary | ICD-10-CM | POA: Insufficient documentation

## 2021-12-31 DIAGNOSIS — C7931 Secondary malignant neoplasm of brain: Secondary | ICD-10-CM | POA: Insufficient documentation

## 2022-01-02 DIAGNOSIS — C7972 Secondary malignant neoplasm of left adrenal gland: Secondary | ICD-10-CM | POA: Diagnosis not present

## 2022-01-04 ENCOUNTER — Other Ambulatory Visit: Payer: Self-pay | Admitting: *Deleted

## 2022-01-04 DIAGNOSIS — C3490 Malignant neoplasm of unspecified part of unspecified bronchus or lung: Secondary | ICD-10-CM

## 2022-01-07 ENCOUNTER — Ambulatory Visit: Admission: RE | Admit: 2022-01-07 | Payer: Medicaid Other | Source: Ambulatory Visit

## 2022-01-07 ENCOUNTER — Inpatient Hospital Stay: Payer: Medicaid Other

## 2022-01-07 DIAGNOSIS — C7972 Secondary malignant neoplasm of left adrenal gland: Secondary | ICD-10-CM | POA: Diagnosis not present

## 2022-01-08 ENCOUNTER — Inpatient Hospital Stay: Payer: Medicaid Other

## 2022-01-08 ENCOUNTER — Ambulatory Visit
Admission: RE | Admit: 2022-01-08 | Discharge: 2022-01-08 | Disposition: A | Discharge status: Home / Self Care | Payer: Medicaid Other | Source: Ambulatory Visit | Attending: Radiation Oncology | Admitting: Radiation Oncology

## 2022-01-08 ENCOUNTER — Other Ambulatory Visit: Payer: Self-pay

## 2022-01-08 DIAGNOSIS — C7972 Secondary malignant neoplasm of left adrenal gland: Secondary | ICD-10-CM | POA: Diagnosis not present

## 2022-01-08 LAB — RAD ONC ARIA SESSION SUMMARY
Course Elapsed Days: 0
Plan Fractions Treated to Date: 1
Plan Prescribed Dose Per Fraction: 2 Gy
Plan Total Fractions Prescribed: 20
Plan Total Prescribed Dose: 40 Gy
Reference Point Dosage Given to Date: 2 Gy
Reference Point Session Dosage Given: 2 Gy
Session Number: 1

## 2022-01-09 ENCOUNTER — Inpatient Hospital Stay: Payer: Medicaid Other

## 2022-01-09 ENCOUNTER — Other Ambulatory Visit: Payer: Self-pay

## 2022-01-09 ENCOUNTER — Ambulatory Visit
Admission: RE | Admit: 2022-01-09 | Discharge: 2022-01-09 | Disposition: A | Discharge status: Home / Self Care | Payer: Medicaid Other | Source: Ambulatory Visit | Attending: Radiation Oncology | Admitting: Radiation Oncology

## 2022-01-09 DIAGNOSIS — C7972 Secondary malignant neoplasm of left adrenal gland: Secondary | ICD-10-CM | POA: Diagnosis not present

## 2022-01-09 LAB — RAD ONC ARIA SESSION SUMMARY
Course Elapsed Days: 1
Plan Fractions Treated to Date: 2
Plan Prescribed Dose Per Fraction: 2 Gy
Plan Total Fractions Prescribed: 20
Plan Total Prescribed Dose: 40 Gy
Reference Point Dosage Given to Date: 4 Gy
Reference Point Session Dosage Given: 2 Gy
Session Number: 2

## 2022-01-10 ENCOUNTER — Inpatient Hospital Stay: Payer: Medicaid Other

## 2022-01-10 ENCOUNTER — Ambulatory Visit
Admission: RE | Admit: 2022-01-10 | Discharge: 2022-01-10 | Disposition: A | Discharge status: Home / Self Care | Payer: Medicaid Other | Source: Ambulatory Visit | Attending: Radiation Oncology | Admitting: Radiation Oncology

## 2022-01-10 ENCOUNTER — Other Ambulatory Visit: Payer: Self-pay

## 2022-01-10 DIAGNOSIS — C7972 Secondary malignant neoplasm of left adrenal gland: Secondary | ICD-10-CM | POA: Diagnosis not present

## 2022-01-10 LAB — RAD ONC ARIA SESSION SUMMARY
Course Elapsed Days: 2
Plan Fractions Treated to Date: 3
Plan Prescribed Dose Per Fraction: 2 Gy
Plan Total Fractions Prescribed: 20
Plan Total Prescribed Dose: 40 Gy
Reference Point Dosage Given to Date: 6 Gy
Reference Point Session Dosage Given: 2 Gy
Session Number: 3

## 2022-01-11 ENCOUNTER — Inpatient Hospital Stay: Payer: Medicaid Other

## 2022-01-11 ENCOUNTER — Ambulatory Visit
Admission: RE | Admit: 2022-01-11 | Discharge: 2022-01-11 | Disposition: A | Discharge status: Home / Self Care | Payer: Medicaid Other | Source: Ambulatory Visit | Attending: Radiation Oncology | Admitting: Radiation Oncology

## 2022-01-11 ENCOUNTER — Other Ambulatory Visit: Payer: Self-pay

## 2022-01-11 DIAGNOSIS — C7972 Secondary malignant neoplasm of left adrenal gland: Secondary | ICD-10-CM | POA: Diagnosis not present

## 2022-01-11 LAB — RAD ONC ARIA SESSION SUMMARY
Course Elapsed Days: 3
Plan Fractions Treated to Date: 4
Plan Prescribed Dose Per Fraction: 2 Gy
Plan Total Fractions Prescribed: 20
Plan Total Prescribed Dose: 40 Gy
Reference Point Dosage Given to Date: 8 Gy
Reference Point Session Dosage Given: 2 Gy
Session Number: 4

## 2022-01-14 ENCOUNTER — Other Ambulatory Visit: Payer: Self-pay

## 2022-01-14 ENCOUNTER — Ambulatory Visit
Admission: RE | Admit: 2022-01-14 | Discharge: 2022-01-14 | Disposition: A | Discharge status: Home / Self Care | Payer: Medicaid Other | Source: Ambulatory Visit | Attending: Radiation Oncology | Admitting: Radiation Oncology

## 2022-01-14 ENCOUNTER — Inpatient Hospital Stay: Payer: Medicaid Other

## 2022-01-14 ENCOUNTER — Telehealth: Payer: Self-pay

## 2022-01-14 DIAGNOSIS — C7972 Secondary malignant neoplasm of left adrenal gland: Secondary | ICD-10-CM | POA: Diagnosis not present

## 2022-01-14 LAB — RAD ONC ARIA SESSION SUMMARY
Course Elapsed Days: 6
Plan Fractions Treated to Date: 5
Plan Prescribed Dose Per Fraction: 2 Gy
Plan Total Fractions Prescribed: 20
Plan Total Prescribed Dose: 40 Gy
Reference Point Dosage Given to Date: 10 Gy
Reference Point Session Dosage Given: 2 Gy
Session Number: 5

## 2022-01-14 NOTE — Telephone Encounter (Signed)
Patient's final radiation treatment scheduled on 6/9. Please schedule lab/MD/ Tecentriq approx 2 weeks after 6/9. please inform pt of appt.  ?

## 2022-01-15 ENCOUNTER — Other Ambulatory Visit: Payer: Self-pay

## 2022-01-15 ENCOUNTER — Encounter: Payer: Self-pay | Admitting: Family Medicine

## 2022-01-15 ENCOUNTER — Inpatient Hospital Stay: Payer: Medicaid Other

## 2022-01-15 ENCOUNTER — Ambulatory Visit
Admission: RE | Admit: 2022-01-15 | Discharge: 2022-01-15 | Disposition: A | Discharge status: Home / Self Care | Payer: Medicaid Other | Source: Ambulatory Visit | Attending: Radiation Oncology | Admitting: Radiation Oncology

## 2022-01-15 ENCOUNTER — Telehealth (INDEPENDENT_AMBULATORY_CARE_PROVIDER_SITE_OTHER): Payer: Medicaid Other | Admitting: Family Medicine

## 2022-01-15 VITALS — Ht 66.0 in | Wt 145.0 lb

## 2022-01-15 DIAGNOSIS — I10 Essential (primary) hypertension: Secondary | ICD-10-CM | POA: Diagnosis not present

## 2022-01-15 DIAGNOSIS — C3490 Malignant neoplasm of unspecified part of unspecified bronchus or lung: Secondary | ICD-10-CM

## 2022-01-15 DIAGNOSIS — C7972 Secondary malignant neoplasm of left adrenal gland: Secondary | ICD-10-CM | POA: Diagnosis not present

## 2022-01-15 LAB — RAD ONC ARIA SESSION SUMMARY
Course Elapsed Days: 7
Plan Fractions Treated to Date: 6
Plan Prescribed Dose Per Fraction: 2 Gy
Plan Total Fractions Prescribed: 20
Plan Total Prescribed Dose: 40 Gy
Reference Point Dosage Given to Date: 12 Gy
Reference Point Session Dosage Given: 2 Gy
Session Number: 6

## 2022-01-15 LAB — CBC
HCT: 39.4 % (ref 36.0–46.0)
Hemoglobin: 13.2 g/dL (ref 12.0–15.0)
MCH: 29.9 pg (ref 26.0–34.0)
MCHC: 33.5 g/dL (ref 30.0–36.0)
MCV: 89.1 fL (ref 80.0–100.0)
Platelets: 265 10*3/uL (ref 150–400)
RBC: 4.42 MIL/uL (ref 3.87–5.11)
RDW: 14.7 % (ref 11.5–15.5)
WBC: 4.9 10*3/uL (ref 4.0–10.5)
nRBC: 0 % (ref 0.0–0.2)

## 2022-01-15 NOTE — Progress Notes (Signed)
? ?Name: Jenna Newton   MRN: [id number]    DOB: 05-Nov-1965   Date:01/15/2022 ? ?     Progress Note ? ?Subjective:  ? ? ?Chief Complaint ? ?Chief Complaint  ?Patient presents with  ? Follow-up  ? ? ?I connected with  Dimas Millin on 01/15/22 at  9:00 AM EDT by telephone and verified that I am speaking with the correct person using two identifiers. ?  ?I discussed the limitations, risks, security and privacy concerns of performing an evaluation and management service by telephone and the availability of in person appointments. Staff also discussed with the patient that there may be a patient responsible charge related to this service.  Patient verbalized understanding and agreed to proceed with encounter. ?Patient Location: home ?Provider Location[facility] ?Additional Individuals present: none ? ?HPI ?F/up on HTN  ?BP meds were reduced last OV due to soft bp after a lot of health and weight changes with CA dx and treatment ?BP Readings from Last 3 Encounters:  ?12/26/21 126/68  ?12/07/21 104/82  ?12/06/21 102/60  ? ?Pt now on amlodipine 2.5 mg ?BP checked about once a week at oncology she states it has been good, last that I can see in chart is 4/26 ?Pt denies CP, SOB, exertional sx, LE edema, palpitation, Ha's, visual disturbances, lightheadedness, hypotension, syncope. ? ?We discussed holding cholesterol med if she wanted to - but she is continuing statin at this time - she has not yet started immunotherapy - currently doing RT to chest/lungs ? ? ? ? ?Patient Active Problem List  ? Diagnosis Date Noted  ? Encounter for antineoplastic immunotherapy 12/04/2021  ? Encounter for antineoplastic chemotherapy 06/01/2021  ? Seizure ([place]) 06/01/2021  ? Small cell lung cancer ([place]) 05/28/2021  ? Brain lesion 05/14/2021  ? Lung mass 05/14/2021  ? White matter disease of brain due to ischemia 12/04/2017  ? Alcohol use 12/04/2017  ? Goals of care, counseling/discussion 04/18/2017  ? Benign hypertension 04/18/2017  ?  Dyslipidemia 04/18/2017  ? Dry mouth 03/14/2017  ? Neuropathy 03/14/2017  ? Tobacco abuse 03/14/2017  ? Mononeuritis 05/12/2014  ? Numbness of lower extremity 05/12/2014  ? Right foot drop 05/12/2014  ? Weakness 05/12/2014  ? ? ?Social History  ? ?Tobacco Use  ? Smoking status: Every Day  ?  Packs/day: 1.00  ?  Years: 25.00  ?  Pack years: 25.00  ?  Types: Cigarettes  ? Smokeless tobacco: Never  ?Substance Use Topics  ? Alcohol use: Yes  ?  Alcohol/week: 14.0 standard drinks  ?  Types: 14 Cans of beer per week  ?  Comment: 12 pack/ week  ? ? ? ?Current Outpatient Medications:  ?  amLODipine (NORVASC) 2.5 MG tablet, Take 1 tablet (2.5 mg total) by mouth daily., Disp: 90 tablet, Rfl: 1 ?  atorvastatin (LIPITOR) 20 MG tablet, Take 1 tablet (20 mg total) by mouth at bedtime. 1 Tablet(s) By Mouth Every Night, Disp: 90 tablet, Rfl: 1 ?  cromolyn (OPTICROM) 4 % ophthalmic solution, Place 1 drop into both eyes 4 (four) times daily as needed., Disp: 10 mL, Rfl: 3 ?  fluticasone (FLONASE) 50 MCG/ACT nasal spray, Place 1 spray into both nostrils daily as needed for allergies., Disp: , Rfl:  ?  hydrocortisone cream 0.5 %, Apply 1 application. topically 2 (two) times daily as needed for itching., Disp: 30 g, Rfl: 3 ?  KLOR-CON M20 20 MEQ tablet, TAKE 1 TABLET BY MOUTH EVERY DAY, Disp: 30 tablet, Rfl: 1 ?  levETIRAcetam (KEPPRA) 500 MG tablet, Take 1 tablet (500 mg total) by mouth 2 (two) times daily., Disp: 120 tablet, Rfl: 0 ?  lidocaine-prilocaine (EMLA) cream, Apply 1 application topically as needed., Disp: 30 g, Rfl: 3 ?  montelukast (SINGULAIR) 10 MG tablet, Take 1 tablet (10 mg total) by mouth at bedtime., Disp: 90 tablet, Rfl: 3 ?  ondansetron (ZOFRAN) 8 MG tablet, Take 1 tablet (8 mg total) by mouth 2 (two) times daily as needed for refractory nausea / vomiting. Start on day 3 after carboplatin chemo., Disp: 30 tablet, Rfl: 1 ?  prochlorperazine (COMPAZINE) 10 MG tablet, Take 1 tablet (10 mg total) by mouth every 6 (six)  hours as needed (Nausea or vomiting)., Disp: 30 tablet, Rfl: 1 ?  saline (AYR) GEL, Place 1 application into the nose every 4 (four) hours., Disp: 14.1 g, Rfl: 3 ?  Tiotropium Bromide-Olodaterol (STIOLTO RESPIMAT) 2.5-2.5 MCG/ACT AERS, Inhale 2 puffs into the lungs daily., Disp: 1 each, Rfl: 0 ?  vitamin B-12 (CYANOCOBALAMIN) 500 MCG tablet, Take 1 tablet (500 mcg total) by mouth daily., Disp: 100 tablet, Rfl: 3 ? ?Allergies  ?Allergen Reactions  ? Penicillins Swelling  ? Meclizine Other (See Comments)  ?  Gave pt headaches   ? Sulfa Antibiotics Swelling  ? ? ?Chart Review: ?I personally reviewed active problem list, medication list, allergies, family history, social history, health maintenance, notes from last encounter, lab results, imaging with the patient/caregiver today. ? ? ?Review of Systems  ?Constitutional: Negative.   ?HENT: Negative.    ?Eyes: Negative.   ?Respiratory: Negative.    ?Cardiovascular: Negative.   ?Gastrointestinal: Negative.   ?Endocrine: Negative.   ?Genitourinary: Negative.   ?Musculoskeletal: Negative.   ?Skin: Negative.   ?Allergic/Immunologic: Negative.   ?Neurological: Negative.   ?Hematological: Negative.   ?Psychiatric/Behavioral: Negative.    ?All other systems reviewed and are negative. ? ? ?Objective:  ? ? ?Virtual encounter, vitals limited, only able to obtain the following ?Today's Vitals  ? 01/15/22 0909  ?Weight: 145 lb (65.8 kg)  ?Height: 5\' 6"  (1.676 m)  ? ?Body mass index is 23.4 kg/m?Marland Kitchen ?Nursing Note and Vital Signs reviewed. ? ?Physical Exam ?Vitals and nursing note reviewed.  ?Neurological:  ?   Mental Status: She is alert.  ?Psychiatric:     ?   Mood and Affect: Mood normal.  ? ? ?PE limited by telephone encounter ? ?No results found for this or any previous visit (from the past 72 hour(s)). ? ?Assessment and Plan:  ? ?Problem List Items Addressed This Visit   ? ?  ? Cardiovascular and Mediastinum  ? Benign hypertension - Primary  ?  Amlodipine dose reduced, BP at goal,  continue 2.5 mg dose daily - get sooner f/up if any low or high readings, f/up in Oct in office for recheck  ? ?  ?  ? ? ? ? ?I provided 12 minutes of non-face-to-face time during this encounter. ? ?Delsa Grana, PA-C ?01/15/22 9:14 AM ? ?

## 2022-01-15 NOTE — Assessment & Plan Note (Signed)
Amlodipine dose reduced, BP at goal, continue 2.5 mg dose daily - get sooner f/up if any low or high readings, f/up in Oct in office for recheck  ?

## 2022-01-16 ENCOUNTER — Other Ambulatory Visit: Payer: Self-pay

## 2022-01-16 ENCOUNTER — Ambulatory Visit
Admission: RE | Admit: 2022-01-16 | Discharge: 2022-01-16 | Disposition: A | Discharge status: Home / Self Care | Payer: Medicaid Other | Source: Ambulatory Visit | Attending: Radiation Oncology | Admitting: Radiation Oncology

## 2022-01-16 ENCOUNTER — Inpatient Hospital Stay: Payer: Medicaid Other

## 2022-01-16 DIAGNOSIS — C7972 Secondary malignant neoplasm of left adrenal gland: Secondary | ICD-10-CM | POA: Diagnosis not present

## 2022-01-16 LAB — RAD ONC ARIA SESSION SUMMARY
Course Elapsed Days: 8
Plan Fractions Treated to Date: 7
Plan Prescribed Dose Per Fraction: 2 Gy
Plan Total Fractions Prescribed: 20
Plan Total Prescribed Dose: 40 Gy
Reference Point Dosage Given to Date: 14 Gy
Reference Point Session Dosage Given: 2 Gy
Session Number: 7

## 2022-01-17 ENCOUNTER — Ambulatory Visit: Payer: Medicaid Other

## 2022-01-17 ENCOUNTER — Inpatient Hospital Stay: Payer: Medicaid Other

## 2022-01-18 ENCOUNTER — Inpatient Hospital Stay: Payer: Medicaid Other

## 2022-01-18 ENCOUNTER — Ambulatory Visit: Payer: Medicaid Other

## 2022-01-20 ENCOUNTER — Other Ambulatory Visit: Payer: Self-pay | Admitting: Oncology

## 2022-01-21 ENCOUNTER — Ambulatory Visit: Payer: Medicaid Other

## 2022-01-21 ENCOUNTER — Inpatient Hospital Stay: Payer: Medicaid Other

## 2022-01-22 ENCOUNTER — Inpatient Hospital Stay: Payer: Medicaid Other

## 2022-01-22 ENCOUNTER — Other Ambulatory Visit: Payer: Self-pay

## 2022-01-22 ENCOUNTER — Ambulatory Visit
Admission: RE | Admit: 2022-01-22 | Discharge: 2022-01-22 | Disposition: A | Discharge status: Home / Self Care | Payer: Medicaid Other | Source: Ambulatory Visit | Attending: Radiation Oncology | Admitting: Radiation Oncology

## 2022-01-22 DIAGNOSIS — C7972 Secondary malignant neoplasm of left adrenal gland: Secondary | ICD-10-CM | POA: Diagnosis not present

## 2022-01-22 LAB — RAD ONC ARIA SESSION SUMMARY
Course Elapsed Days: 14
Plan Fractions Treated to Date: 8
Plan Prescribed Dose Per Fraction: 2 Gy
Plan Total Fractions Prescribed: 20
Plan Total Prescribed Dose: 40 Gy
Reference Point Dosage Given to Date: 16 Gy
Reference Point Session Dosage Given: 2 Gy
Session Number: 8

## 2022-01-22 NOTE — Telephone Encounter (Signed)
Component Ref Range & Units 1 mo ago (12/07/21) 3 mo ago (10/09/21) 4 mo ago (09/18/21) 4 mo ago (08/28/21) 5 mo ago (08/06/21) 6 mo ago (07/16/21) 6 mo ago (07/05/21)  Potassium 3.5 - 5.1 mmol/L 3.8  3.7  3.7  3.4 Low   3.6  4.3  3.5

## 2022-01-23 ENCOUNTER — Other Ambulatory Visit: Payer: Self-pay

## 2022-01-23 ENCOUNTER — Ambulatory Visit
Admission: RE | Admit: 2022-01-23 | Discharge: 2022-01-23 | Disposition: A | Discharge status: Home / Self Care | Payer: Medicaid Other | Source: Ambulatory Visit | Attending: Radiation Oncology | Admitting: Radiation Oncology

## 2022-01-23 ENCOUNTER — Inpatient Hospital Stay: Payer: Medicaid Other

## 2022-01-23 DIAGNOSIS — C7972 Secondary malignant neoplasm of left adrenal gland: Secondary | ICD-10-CM | POA: Diagnosis not present

## 2022-01-23 LAB — RAD ONC ARIA SESSION SUMMARY
Course Elapsed Days: 15
Plan Fractions Treated to Date: 9
Plan Prescribed Dose Per Fraction: 2 Gy
Plan Total Fractions Prescribed: 20
Plan Total Prescribed Dose: 40 Gy
Reference Point Dosage Given to Date: 18 Gy
Reference Point Session Dosage Given: 2 Gy
Session Number: 9

## 2022-01-24 ENCOUNTER — Inpatient Hospital Stay: Payer: Medicaid Other

## 2022-01-24 ENCOUNTER — Other Ambulatory Visit: Payer: Self-pay

## 2022-01-24 ENCOUNTER — Ambulatory Visit
Admission: RE | Admit: 2022-01-24 | Discharge: 2022-01-24 | Disposition: A | Discharge status: Home / Self Care | Payer: Medicaid Other | Source: Ambulatory Visit | Attending: Radiation Oncology | Admitting: Radiation Oncology

## 2022-01-24 DIAGNOSIS — C7972 Secondary malignant neoplasm of left adrenal gland: Secondary | ICD-10-CM | POA: Diagnosis not present

## 2022-01-24 LAB — RAD ONC ARIA SESSION SUMMARY
Course Elapsed Days: 16
Plan Fractions Treated to Date: 10
Plan Prescribed Dose Per Fraction: 2 Gy
Plan Total Fractions Prescribed: 20
Plan Total Prescribed Dose: 40 Gy
Reference Point Dosage Given to Date: 20 Gy
Reference Point Session Dosage Given: 2 Gy
Session Number: 10

## 2022-01-25 ENCOUNTER — Other Ambulatory Visit: Payer: Self-pay

## 2022-01-25 ENCOUNTER — Inpatient Hospital Stay: Payer: Medicaid Other

## 2022-01-25 ENCOUNTER — Ambulatory Visit
Admission: RE | Admit: 2022-01-25 | Discharge: 2022-01-25 | Disposition: A | Discharge status: Home / Self Care | Payer: Medicaid Other | Source: Ambulatory Visit | Attending: Radiation Oncology | Admitting: Radiation Oncology

## 2022-01-25 ENCOUNTER — Encounter: Payer: Self-pay | Admitting: Oncology

## 2022-01-25 DIAGNOSIS — C7972 Secondary malignant neoplasm of left adrenal gland: Secondary | ICD-10-CM | POA: Diagnosis not present

## 2022-01-25 LAB — RAD ONC ARIA SESSION SUMMARY
Course Elapsed Days: 17
Plan Fractions Treated to Date: 11
Plan Prescribed Dose Per Fraction: 2 Gy
Plan Total Fractions Prescribed: 20
Plan Total Prescribed Dose: 40 Gy
Reference Point Dosage Given to Date: 22 Gy
Reference Point Session Dosage Given: 2 Gy
Session Number: 11

## 2022-01-29 ENCOUNTER — Other Ambulatory Visit: Payer: Self-pay

## 2022-01-29 ENCOUNTER — Inpatient Hospital Stay: Payer: Medicaid Other

## 2022-01-29 ENCOUNTER — Ambulatory Visit
Admission: RE | Admit: 2022-01-29 | Discharge: 2022-01-29 | Disposition: A | Discharge status: Home / Self Care | Payer: Medicaid Other | Source: Ambulatory Visit | Attending: Radiation Oncology | Admitting: Radiation Oncology

## 2022-01-29 DIAGNOSIS — C7972 Secondary malignant neoplasm of left adrenal gland: Secondary | ICD-10-CM | POA: Diagnosis not present

## 2022-01-29 DIAGNOSIS — C3490 Malignant neoplasm of unspecified part of unspecified bronchus or lung: Secondary | ICD-10-CM

## 2022-01-29 LAB — RAD ONC ARIA SESSION SUMMARY
Course Elapsed Days: 21
Plan Fractions Treated to Date: 12
Plan Prescribed Dose Per Fraction: 2 Gy
Plan Total Fractions Prescribed: 20
Plan Total Prescribed Dose: 40 Gy
Reference Point Dosage Given to Date: 24 Gy
Reference Point Session Dosage Given: 2 Gy
Session Number: 12

## 2022-01-29 LAB — CBC
HCT: 39.6 % (ref 36.0–46.0)
Hemoglobin: 12.7 g/dL (ref 12.0–15.0)
MCH: 28.8 pg (ref 26.0–34.0)
MCHC: 32.1 g/dL (ref 30.0–36.0)
MCV: 89.8 fL (ref 80.0–100.0)
Platelets: 261 10*3/uL (ref 150–400)
RBC: 4.41 MIL/uL (ref 3.87–5.11)
RDW: 15.1 % (ref 11.5–15.5)
WBC: 4 10*3/uL (ref 4.0–10.5)
nRBC: 0 % (ref 0.0–0.2)

## 2022-01-30 ENCOUNTER — Ambulatory Visit
Admission: RE | Admit: 2022-01-30 | Discharge: 2022-01-30 | Disposition: A | Discharge status: Home / Self Care | Payer: Medicaid Other | Source: Ambulatory Visit | Attending: Radiation Oncology | Admitting: Radiation Oncology

## 2022-01-30 ENCOUNTER — Other Ambulatory Visit: Payer: Self-pay

## 2022-01-30 ENCOUNTER — Inpatient Hospital Stay: Payer: Medicaid Other

## 2022-01-30 DIAGNOSIS — C7972 Secondary malignant neoplasm of left adrenal gland: Secondary | ICD-10-CM | POA: Diagnosis not present

## 2022-01-30 LAB — RAD ONC ARIA SESSION SUMMARY
Course Elapsed Days: 22
Plan Fractions Treated to Date: 13
Plan Prescribed Dose Per Fraction: 2 Gy
Plan Total Fractions Prescribed: 20
Plan Total Prescribed Dose: 40 Gy
Reference Point Dosage Given to Date: 26 Gy
Reference Point Session Dosage Given: 2 Gy
Session Number: 13

## 2022-01-31 ENCOUNTER — Inpatient Hospital Stay: Payer: Medicaid Other | Attending: Oncology

## 2022-01-31 ENCOUNTER — Ambulatory Visit
Admission: RE | Admit: 2022-01-31 | Discharge: 2022-01-31 | Disposition: A | Discharge status: Home / Self Care | Payer: Medicaid Other | Source: Ambulatory Visit | Attending: Radiation Oncology | Admitting: Radiation Oncology

## 2022-01-31 ENCOUNTER — Other Ambulatory Visit: Payer: Self-pay

## 2022-01-31 DIAGNOSIS — C3490 Malignant neoplasm of unspecified part of unspecified bronchus or lung: Secondary | ICD-10-CM | POA: Insufficient documentation

## 2022-01-31 DIAGNOSIS — C7972 Secondary malignant neoplasm of left adrenal gland: Secondary | ICD-10-CM | POA: Diagnosis present

## 2022-01-31 LAB — RAD ONC ARIA SESSION SUMMARY
Course Elapsed Days: 23
Plan Fractions Treated to Date: 14
Plan Prescribed Dose Per Fraction: 2 Gy
Plan Total Fractions Prescribed: 20
Plan Total Prescribed Dose: 40 Gy
Reference Point Dosage Given to Date: 28 Gy
Reference Point Session Dosage Given: 2 Gy
Session Number: 14

## 2022-02-01 ENCOUNTER — Inpatient Hospital Stay: Payer: Medicaid Other

## 2022-02-01 ENCOUNTER — Ambulatory Visit
Admission: RE | Admit: 2022-02-01 | Discharge: 2022-02-01 | Disposition: A | Discharge status: Home / Self Care | Payer: Medicaid Other | Source: Ambulatory Visit | Attending: Radiation Oncology | Admitting: Radiation Oncology

## 2022-02-01 ENCOUNTER — Other Ambulatory Visit: Payer: Self-pay

## 2022-02-01 DIAGNOSIS — C7972 Secondary malignant neoplasm of left adrenal gland: Secondary | ICD-10-CM | POA: Diagnosis not present

## 2022-02-01 LAB — RAD ONC ARIA SESSION SUMMARY
Course Elapsed Days: 24
Plan Fractions Treated to Date: 15
Plan Prescribed Dose Per Fraction: 2 Gy
Plan Total Fractions Prescribed: 20
Plan Total Prescribed Dose: 40 Gy
Reference Point Dosage Given to Date: 30 Gy
Reference Point Session Dosage Given: 2 Gy
Session Number: 15

## 2022-02-04 ENCOUNTER — Other Ambulatory Visit: Payer: Self-pay

## 2022-02-04 ENCOUNTER — Inpatient Hospital Stay: Payer: Medicaid Other

## 2022-02-04 ENCOUNTER — Ambulatory Visit
Admission: RE | Admit: 2022-02-04 | Discharge: 2022-02-04 | Disposition: A | Discharge status: Home / Self Care | Payer: Medicaid Other | Source: Ambulatory Visit | Attending: Radiation Oncology | Admitting: Radiation Oncology

## 2022-02-04 DIAGNOSIS — C7972 Secondary malignant neoplasm of left adrenal gland: Secondary | ICD-10-CM | POA: Diagnosis not present

## 2022-02-04 LAB — RAD ONC ARIA SESSION SUMMARY
Course Elapsed Days: 27
Plan Fractions Treated to Date: 16
Plan Prescribed Dose Per Fraction: 2 Gy
Plan Total Fractions Prescribed: 20
Plan Total Prescribed Dose: 40 Gy
Reference Point Dosage Given to Date: 32 Gy
Reference Point Session Dosage Given: 2 Gy
Session Number: 16

## 2022-02-05 ENCOUNTER — Ambulatory Visit
Admission: RE | Admit: 2022-02-05 | Discharge: 2022-02-05 | Disposition: A | Discharge status: Home / Self Care | Payer: Medicaid Other | Source: Ambulatory Visit | Attending: Radiation Oncology | Admitting: Radiation Oncology

## 2022-02-05 ENCOUNTER — Inpatient Hospital Stay: Payer: Medicaid Other

## 2022-02-05 ENCOUNTER — Other Ambulatory Visit: Payer: Self-pay

## 2022-02-05 DIAGNOSIS — C7972 Secondary malignant neoplasm of left adrenal gland: Secondary | ICD-10-CM | POA: Diagnosis not present

## 2022-02-05 LAB — RAD ONC ARIA SESSION SUMMARY
Course Elapsed Days: 28
Plan Fractions Treated to Date: 17
Plan Prescribed Dose Per Fraction: 2 Gy
Plan Total Fractions Prescribed: 20
Plan Total Prescribed Dose: 40 Gy
Reference Point Dosage Given to Date: 34 Gy
Reference Point Session Dosage Given: 2 Gy
Session Number: 17

## 2022-02-06 ENCOUNTER — Ambulatory Visit
Admission: RE | Admit: 2022-02-06 | Discharge: 2022-02-06 | Disposition: A | Discharge status: Home / Self Care | Payer: Medicaid Other | Source: Ambulatory Visit | Attending: Radiation Oncology | Admitting: Radiation Oncology

## 2022-02-06 ENCOUNTER — Inpatient Hospital Stay: Payer: Medicaid Other

## 2022-02-06 ENCOUNTER — Other Ambulatory Visit: Payer: Self-pay

## 2022-02-06 DIAGNOSIS — C7972 Secondary malignant neoplasm of left adrenal gland: Secondary | ICD-10-CM | POA: Diagnosis not present

## 2022-02-06 LAB — RAD ONC ARIA SESSION SUMMARY
Course Elapsed Days: 29
Plan Fractions Treated to Date: 18
Plan Prescribed Dose Per Fraction: 2 Gy
Plan Total Fractions Prescribed: 20
Plan Total Prescribed Dose: 40 Gy
Reference Point Dosage Given to Date: 36 Gy
Reference Point Session Dosage Given: 2 Gy
Session Number: 18

## 2022-02-07 ENCOUNTER — Other Ambulatory Visit: Payer: Self-pay

## 2022-02-07 ENCOUNTER — Inpatient Hospital Stay: Payer: Medicaid Other

## 2022-02-07 ENCOUNTER — Ambulatory Visit
Admission: RE | Admit: 2022-02-07 | Discharge: 2022-02-07 | Disposition: A | Discharge status: Home / Self Care | Payer: Medicaid Other | Source: Ambulatory Visit | Attending: Radiation Oncology | Admitting: Radiation Oncology

## 2022-02-07 DIAGNOSIS — C7972 Secondary malignant neoplasm of left adrenal gland: Secondary | ICD-10-CM | POA: Diagnosis not present

## 2022-02-07 LAB — RAD ONC ARIA SESSION SUMMARY
Course Elapsed Days: 30
Plan Fractions Treated to Date: 19
Plan Prescribed Dose Per Fraction: 2 Gy
Plan Total Fractions Prescribed: 20
Plan Total Prescribed Dose: 40 Gy
Reference Point Dosage Given to Date: 38 Gy
Reference Point Session Dosage Given: 2 Gy
Session Number: 19

## 2022-02-08 ENCOUNTER — Ambulatory Visit
Admission: RE | Admit: 2022-02-08 | Discharge: 2022-02-08 | Disposition: A | Discharge status: Home / Self Care | Payer: Medicaid Other | Source: Ambulatory Visit | Attending: Radiation Oncology | Admitting: Radiation Oncology

## 2022-02-08 ENCOUNTER — Inpatient Hospital Stay: Payer: Medicaid Other

## 2022-02-08 ENCOUNTER — Other Ambulatory Visit: Payer: Self-pay

## 2022-02-08 DIAGNOSIS — C7972 Secondary malignant neoplasm of left adrenal gland: Secondary | ICD-10-CM | POA: Diagnosis not present

## 2022-02-08 LAB — RAD ONC ARIA SESSION SUMMARY
Course Elapsed Days: 31
Plan Fractions Treated to Date: 20
Plan Prescribed Dose Per Fraction: 2 Gy
Plan Total Fractions Prescribed: 20
Plan Total Prescribed Dose: 40 Gy
Reference Point Dosage Given to Date: 40 Gy
Reference Point Session Dosage Given: 2 Gy
Session Number: 20

## 2022-02-13 ENCOUNTER — Ambulatory Visit: Payer: Medicaid Other | Admitting: Radiation Oncology

## 2022-02-22 ENCOUNTER — Inpatient Hospital Stay: Payer: Medicaid Other

## 2022-02-22 ENCOUNTER — Encounter: Payer: Self-pay | Admitting: Oncology

## 2022-02-22 ENCOUNTER — Inpatient Hospital Stay (HOSPITAL_BASED_OUTPATIENT_CLINIC_OR_DEPARTMENT_OTHER): Payer: Medicaid Other | Admitting: Oncology

## 2022-02-22 VITALS — BP 122/70 | HR 68 | Temp 97.4°F | Resp 16 | Wt 156.5 lb

## 2022-02-22 DIAGNOSIS — C3490 Malignant neoplasm of unspecified part of unspecified bronchus or lung: Secondary | ICD-10-CM

## 2022-02-22 DIAGNOSIS — C7931 Secondary malignant neoplasm of brain: Secondary | ICD-10-CM | POA: Diagnosis present

## 2022-02-22 DIAGNOSIS — F17210 Nicotine dependence, cigarettes, uncomplicated: Secondary | ICD-10-CM | POA: Insufficient documentation

## 2022-02-22 DIAGNOSIS — Z8042 Family history of malignant neoplasm of prostate: Secondary | ICD-10-CM | POA: Diagnosis not present

## 2022-02-22 DIAGNOSIS — D6481 Anemia due to antineoplastic chemotherapy: Secondary | ICD-10-CM | POA: Insufficient documentation

## 2022-02-22 DIAGNOSIS — Z5112 Encounter for antineoplastic immunotherapy: Secondary | ICD-10-CM | POA: Diagnosis not present

## 2022-02-22 DIAGNOSIS — G939 Disorder of brain, unspecified: Secondary | ICD-10-CM

## 2022-02-22 DIAGNOSIS — E039 Hypothyroidism, unspecified: Secondary | ICD-10-CM | POA: Diagnosis not present

## 2022-02-22 DIAGNOSIS — Z5111 Encounter for antineoplastic chemotherapy: Secondary | ICD-10-CM | POA: Diagnosis not present

## 2022-02-22 DIAGNOSIS — Z923 Personal history of irradiation: Secondary | ICD-10-CM | POA: Diagnosis not present

## 2022-02-22 DIAGNOSIS — Z79899 Other long term (current) drug therapy: Secondary | ICD-10-CM | POA: Diagnosis not present

## 2022-02-22 DIAGNOSIS — Z79890 Hormone replacement therapy (postmenopausal): Secondary | ICD-10-CM | POA: Insufficient documentation

## 2022-02-22 DIAGNOSIS — T451X5D Adverse effect of antineoplastic and immunosuppressive drugs, subsequent encounter: Secondary | ICD-10-CM | POA: Insufficient documentation

## 2022-02-22 LAB — COMPREHENSIVE METABOLIC PANEL
ALT: 16 U/L (ref 0–44)
AST: 22 U/L (ref 15–41)
Albumin: 4 g/dL (ref 3.5–5.0)
Alkaline Phosphatase: 74 U/L (ref 38–126)
Anion gap: 9 (ref 5–15)
BUN: 7 mg/dL (ref 6–20)
CO2: 27 mmol/L (ref 22–32)
Calcium: 9.1 mg/dL (ref 8.9–10.3)
Chloride: 103 mmol/L (ref 98–111)
Creatinine, Ser: 0.61 mg/dL (ref 0.44–1.00)
GFR, Estimated: >60 mL/min (ref >60)
Glucose, Bld: 90 mg/dL (ref 70–99)
Potassium: 3.6 mmol/L (ref 3.5–5.1)
Sodium: 139 mmol/L (ref 135–145)
Total Bilirubin: 0.2 mg/dL — ABNORMAL LOW (ref 0.3–1.2)
Total Protein: 7.5 g/dL (ref 6.5–8.1)

## 2022-02-22 LAB — CBC WITH DIFFERENTIAL/PLATELET
Abs Immature Granulocytes: 0.01 10*3/uL (ref 0.00–0.07)
Basophils Absolute: 0 10*3/uL (ref 0.0–0.1)
Basophils Relative: 0 %
Eosinophils Absolute: 0 10*3/uL (ref 0.0–0.5)
Eosinophils Relative: 1 %
HCT: 35.7 % — ABNORMAL LOW (ref 36.0–46.0)
Hemoglobin: 11.8 g/dL — ABNORMAL LOW (ref 12.0–15.0)
Immature Granulocytes: 0 %
Lymphocytes Relative: 17 %
Lymphs Abs: 0.8 10*3/uL (ref 0.7–4.0)
MCH: 29.7 pg (ref 26.0–34.0)
MCHC: 33.1 g/dL (ref 30.0–36.0)
MCV: 89.9 fL (ref 80.0–100.0)
Monocytes Absolute: 0.4 10*3/uL (ref 0.1–1.0)
Monocytes Relative: 9 %
Neutro Abs: 3.2 10*3/uL (ref 1.7–7.7)
Neutrophils Relative %: 73 %
Platelets: 252 10*3/uL (ref 150–400)
RBC: 3.97 MIL/uL (ref 3.87–5.11)
RDW: 15.4 % (ref 11.5–15.5)
WBC: 4.5 10*3/uL (ref 4.0–10.5)
nRBC: 0 % (ref 0.0–0.2)

## 2022-02-22 LAB — T4, FREE: Free T4: <0.25 ng/dL — ABNORMAL LOW (ref 0.61–1.12)

## 2022-02-22 LAB — TSH: TSH: 134 u[IU]/mL — ABNORMAL HIGH (ref 0.350–4.500)

## 2022-02-22 MED ORDER — GABAPENTIN 100 MG PO CAPS: 100.0000 mg | ORAL_CAPSULE | Freq: Every day | ORAL | 0 refills | Status: DC

## 2022-02-22 MED ORDER — HEPARIN SOD (PORK) LOCK FLUSH 100 UNIT/ML IV SOLN
500.0000 [IU] | Freq: Once | INTRAVENOUS | Status: AC | PRN
  Administered 2022-02-22: 500 [IU]
  Filled 2022-02-22: qty 5

## 2022-02-22 MED ORDER — SODIUM CHLORIDE 0.9 % IV SOLN
1200.0000 mg | Freq: Once | INTRAVENOUS | Status: AC
  Administered 2022-02-22: 1200 mg via INTRAVENOUS
  Filled 2022-02-22: qty 20

## 2022-02-22 MED ORDER — SODIUM CHLORIDE 0.9 % IV SOLN
Freq: Once | INTRAVENOUS | Status: AC
  Filled 2022-02-22: qty 250

## 2022-02-23 ENCOUNTER — Encounter: Payer: Self-pay | Admitting: Oncology

## 2022-02-23 DIAGNOSIS — E039 Hypothyroidism, unspecified: Secondary | ICD-10-CM | POA: Insufficient documentation

## 2022-02-23 MED ORDER — LEVOTHYROXINE SODIUM 50 MCG PO TABS: 50.0000 ug | ORAL_TABLET | Freq: Every day | ORAL | 0 refills | Status: DC

## 2022-02-25 ENCOUNTER — Telehealth: Payer: Self-pay

## 2022-02-26 ENCOUNTER — Other Ambulatory Visit: Payer: Self-pay

## 2022-02-26 ENCOUNTER — Emergency Department: Payer: Medicaid Other

## 2022-02-26 ENCOUNTER — Encounter: Payer: Self-pay | Admitting: Oncology

## 2022-02-26 ENCOUNTER — Emergency Department
Admission: EM | Admit: 2022-02-26 | Discharge: 2022-02-26 | Disposition: A | Discharge status: Home / Self Care | Payer: Medicaid Other | Attending: Emergency Medicine | Admitting: Emergency Medicine

## 2022-02-26 DIAGNOSIS — E039 Hypothyroidism, unspecified: Secondary | ICD-10-CM | POA: Insufficient documentation

## 2022-02-26 DIAGNOSIS — Z85118 Personal history of other malignant neoplasm of bronchus and lung: Secondary | ICD-10-CM | POA: Diagnosis not present

## 2022-02-26 DIAGNOSIS — R531 Weakness: Secondary | ICD-10-CM | POA: Diagnosis present

## 2022-02-26 LAB — URINALYSIS, ROUTINE W REFLEX MICROSCOPIC
Bilirubin Urine: NEGATIVE
Glucose, UA: NEGATIVE mg/dL
Hgb urine dipstick: NEGATIVE
Ketones, ur: NEGATIVE mg/dL
Nitrite: NEGATIVE
Protein, ur: NEGATIVE mg/dL
Specific Gravity, Urine: 1.018 (ref 1.005–1.030)
pH: 7 (ref 5.0–8.0)

## 2022-02-26 LAB — BASIC METABOLIC PANEL
Anion gap: 9 (ref 5–15)
BUN: 8 mg/dL (ref 6–20)
CO2: 28 mmol/L (ref 22–32)
Calcium: 9.5 mg/dL (ref 8.9–10.3)
Chloride: 102 mmol/L (ref 98–111)
Creatinine, Ser: 0.5 mg/dL (ref 0.44–1.00)
GFR, Estimated: >60 mL/min (ref >60)
Glucose, Bld: 102 mg/dL — ABNORMAL HIGH (ref 70–99)
Potassium: 3.5 mmol/L (ref 3.5–5.1)
Sodium: 139 mmol/L (ref 135–145)

## 2022-02-26 LAB — CBC
HCT: 39 % (ref 36.0–46.0)
Hemoglobin: 12.6 g/dL (ref 12.0–15.0)
MCH: 29.6 pg (ref 26.0–34.0)
MCHC: 32.3 g/dL (ref 30.0–36.0)
MCV: 91.5 fL (ref 80.0–100.0)
Platelets: 234 10*3/uL (ref 150–400)
RBC: 4.26 MIL/uL (ref 3.87–5.11)
RDW: 15.4 % (ref 11.5–15.5)
WBC: 4.6 10*3/uL (ref 4.0–10.5)
nRBC: 0 % (ref 0.0–0.2)

## 2022-02-26 LAB — TSH: TSH: 135.211 u[IU]/mL — ABNORMAL HIGH (ref 0.350–4.500)

## 2022-02-26 LAB — TROPONIN I (HIGH SENSITIVITY): Troponin I (High Sensitivity): 4 ng/L (ref <18)

## 2022-02-26 MED ORDER — SODIUM CHLORIDE 0.9 % IV BOLUS
500.0000 mL | Freq: Once | INTRAVENOUS | Status: AC
Start: 2022-02-26 — End: 2022-02-26
  Administered 2022-02-26: 500 mL via INTRAVENOUS

## 2022-02-26 NOTE — ED Provider Notes (Signed)
[facility] Provider Note    Event Date/Time   First MD Initiated Contact with Patient 02/26/22 1204     (approximate)   History   Weakness   HPI  Jenna Newton is a 56 y.o. female with a history of small cell lung cancer with brain metastases being treated with palliative chemo and radiation who presents with generalized weakness over approximately last 3 weeks since around the time of her last radiation treatment on her chest.  The patient reports persistent numbness in her legs, somewhat worse on the left than it has been, but no weakness.  She is able to walk.  She denies any fever but has had some chills.  She denies vomiting or diarrhea, urinary symptoms, chest pain, or difficulty breathing.  She has had some intermittent headaches but is not having it currently.      Physical Exam   Triage Vital Signs: ED Triage Vitals  Enc Vitals Group     BP 02/26/22 1143 134/81     Pulse Rate 02/26/22 1143 70     Resp 02/26/22 1143 18     Temp 02/26/22 1143 97.7 F (36.5 C)     Temp Source 02/26/22 1143 Oral     SpO2 02/26/22 1143 97 %     Weight --      Height --      Head Circumference --      Peak Flow --      Pain Score 02/26/22 1147 6     Pain Loc --      Pain Edu? --      Excl. in GC? --     Most recent vital signs: Vitals:   02/26/22 1143 02/26/22 1400  BP: 134/81 119/74  Pulse: 70 (!) 56  Resp: 18 16  Temp: 97.7 F (36.5 C)   SpO2: 97% 97%    General: Alert and oriented, comfortable appearing. CV:  Good peripheral perfusion.  Resp:  Normal effort.  Lungs CTAB. Abd:  Soft and nontender.  No distention.  Other:  Dry mucous membranes.  EOMI.  PR leg.  No facial droop.  Motor intact in all extremities.  Normal coordination.  Normal gait.   ED Results / Procedures / Treatments   Labs (all labs ordered are listed, but only abnormal results are displayed) Labs Reviewed  BASIC METABOLIC PANEL - Abnormal; Notable for the following  components:      Result Value   Glucose, Bld 102 (*)    All other components within normal limits  URINALYSIS, ROUTINE W REFLEX MICROSCOPIC - Abnormal; Notable for the following components:   Color, Urine YELLOW (*)    APPearance CLOUDY (*)    Leukocytes,Ua TRACE (*)    Bacteria, UA RARE (*)    All other components within normal limits  TSH - Abnormal; Notable for the following components:   TSH 135.211 (*)    All other components within normal limits  CBC  CBG MONITORING, ED  TROPONIN I (HIGH SENSITIVITY)  TROPONIN I (HIGH SENSITIVITY)     EKG  ED ECG REPORT I, Dionne Bucy, the attending physician, personally viewed and interpreted this ECG.  Date: 02/26/2022 EKG Time: 1148 Rate: 68 Rhythm: normal sinus rhythm QRS Axis: normal Intervals: normal ST/T Wave abnormalities: Nonspecific diffuse T wave flattening, no other ST abnormalities Narrative Interpretation: Nonspecific T wave abnormalities when compared to EKG of 05/31/2021   RADIOLOGY  Chest x-ray: I independently viewed and interpreted the images; there is  no focal consolidation or edema  PROCEDURES:  Critical Care performed: No  Procedures   MEDICATIONS ORDERED IN ED: Medications  sodium chloride 0.9 % bolus 500 mL (500 mLs Intravenous New Bag/Given 02/26/22 1347)     IMPRESSION / MDM / ASSESSMENT AND PLAN / ED COURSE  I reviewed the triage vital signs and the nursing notes.  56 year old female with PMH as noted above presents with generalized weakness over the last several weeks with an intermittent headache and some lower extremity neuropathy but no other focal symptoms.  I reviewed the past medical records.  The patient follows with the cancer center here.  She was last evaluated on 6/23.  At that time she was noted to have improved gait compared to prior.  She had a TSH which was very elevated and she was started on levothyroxine yesterday although it is unclear if she started taking it.  She  last had radiation on 6/9.  On exam the patient is overall relatively comfortable appearing.  Her vital signs are normal.  Neurologic exam is normal.  Abdomen soft and nontender.  Differential diagnosis includes, but is not limited to, hypothyroidism, dehydration, electrolyte abnormality, other metabolic disturbance, side effects of radiation treatment, progression of her cancer, or less likely ischemic heart disease.  Patient's presentation is most consistent with acute presentation with potential threat to life or bodily function.  We will obtain lab work-up, chest x-ray, give fluids, and reassess.  The patient is on the cardiac monitor to evaluate for evidence of arrhythmia and/or significant heart rate changes.  ----------------------------------------- 3:02 PM on 02/26/2022 -----------------------------------------  Overall work-up is reassuring.  CBC shows no leukocytosis or anemia.  The electrolytes are normal.  Urinalysis shows no evidence of infection.  Troponin is negative.  Given the duration of the symptoms of several weeks with no acute change today, as well as the lack of chest pain, shortness of breath, or other anginal equivalents I do not suspect ACS or other cardiac ischemia, so there is no indication for repeat troponin.  TSH remains significantly elevated.  The patient's overall presentation and symptoms are consistent with hypothyroidism.  She states that she has taken the levothyroxine since it was prescribed yesterday.  I expect that her symptoms will improve as she continues to take it.  There is no evidence of myxedema or other thyroid crisis.  At this time, there is no indication for further ED work-up or admission.  The patient feels comfortable going home.  She is stable for outpatient treatment.  I counseled her on the results of the work-up and plan of care and she is in agreement.  Return precautions given, and she expresses understanding.    FINAL CLINICAL  IMPRESSION(S) / ED DIAGNOSES   Final diagnoses:  Generalized weakness  Hypothyroidism, unspecified type     Rx / DC Orders   ED Discharge Orders     None        Note:  This document was prepared using Dragon voice recognition software and may include unintentional dictation errors.    Dionne Bucy, MD 02/26/22 1505

## 2022-02-26 NOTE — ED Triage Notes (Signed)
First Nurse Note:  Arrives via [facility]:  C/O not feeling well x 3 weeks since last radiation treatment.  Wishes to be evaluated in ED.   CBG:  128 VS wnl

## 2022-02-26 NOTE — ED Notes (Signed)
Pt A&O, IV removed, pt given discharge instructions, pt assisted to main lobby in wheelchair.

## 2022-03-01 ENCOUNTER — Other Ambulatory Visit: Payer: Self-pay | Admitting: Oncology

## 2022-03-08 ENCOUNTER — Observation Stay
Admission: EM | Admit: 2022-03-08 | Discharge: 2022-03-09 | Disposition: A | Discharge status: Hospice | Payer: Medicaid Other | Attending: Internal Medicine | Admitting: Internal Medicine

## 2022-03-08 ENCOUNTER — Other Ambulatory Visit: Payer: Self-pay

## 2022-03-08 ENCOUNTER — Emergency Department: Payer: Medicaid Other

## 2022-03-08 ENCOUNTER — Encounter: Payer: Self-pay | Admitting: Emergency Medicine

## 2022-03-08 DIAGNOSIS — Z20822 Contact with and (suspected) exposure to covid-19: Secondary | ICD-10-CM | POA: Diagnosis not present

## 2022-03-08 DIAGNOSIS — Z79899 Other long term (current) drug therapy: Secondary | ICD-10-CM | POA: Diagnosis not present

## 2022-03-08 DIAGNOSIS — I10 Essential (primary) hypertension: Secondary | ICD-10-CM

## 2022-03-08 DIAGNOSIS — F17210 Nicotine dependence, cigarettes, uncomplicated: Secondary | ICD-10-CM | POA: Insufficient documentation

## 2022-03-08 DIAGNOSIS — C7931 Secondary malignant neoplasm of brain: Secondary | ICD-10-CM | POA: Insufficient documentation

## 2022-03-08 DIAGNOSIS — Z7189 Other specified counseling: Secondary | ICD-10-CM | POA: Insufficient documentation

## 2022-03-08 DIAGNOSIS — C3490 Malignant neoplasm of unspecified part of unspecified bronchus or lung: Secondary | ICD-10-CM | POA: Diagnosis not present

## 2022-03-08 DIAGNOSIS — Z720 Tobacco use: Secondary | ICD-10-CM | POA: Diagnosis present

## 2022-03-08 DIAGNOSIS — R4182 Altered mental status, unspecified: Secondary | ICD-10-CM | POA: Diagnosis present

## 2022-03-08 DIAGNOSIS — E039 Hypothyroidism, unspecified: Secondary | ICD-10-CM | POA: Diagnosis not present

## 2022-03-08 DIAGNOSIS — G936 Cerebral edema: Secondary | ICD-10-CM | POA: Insufficient documentation

## 2022-03-08 LAB — CBC WITH DIFFERENTIAL/PLATELET
Abs Immature Granulocytes: 0.02 10*3/uL (ref 0.00–0.07)
Basophils Absolute: 0 10*3/uL (ref 0.0–0.1)
Basophils Relative: 0 %
Eosinophils Absolute: 0 10*3/uL (ref 0.0–0.5)
Eosinophils Relative: 0 %
HCT: 35.7 % — ABNORMAL LOW (ref 36.0–46.0)
Hemoglobin: 11.8 g/dL — ABNORMAL LOW (ref 12.0–15.0)
Immature Granulocytes: 0 %
Lymphocytes Relative: 8 %
Lymphs Abs: 0.5 10*3/uL — ABNORMAL LOW (ref 0.7–4.0)
MCH: 29.5 pg (ref 26.0–34.0)
MCHC: 33.1 g/dL (ref 30.0–36.0)
MCV: 89.3 fL (ref 80.0–100.0)
Monocytes Absolute: 0.4 10*3/uL (ref 0.1–1.0)
Monocytes Relative: 6 %
Neutro Abs: 6 10*3/uL (ref 1.7–7.7)
Neutrophils Relative %: 86 %
Platelets: 219 10*3/uL (ref 150–400)
RBC: 4 MIL/uL (ref 3.87–5.11)
RDW: 15.4 % (ref 11.5–15.5)
WBC: 7 10*3/uL (ref 4.0–10.5)
nRBC: 0 % (ref 0.0–0.2)

## 2022-03-08 LAB — RESP PANEL BY RT-PCR (FLU A&B, COVID) ARPGX2
Influenza A by PCR: NEGATIVE
Influenza B by PCR: NEGATIVE
SARS Coronavirus 2 by RT PCR: NEGATIVE

## 2022-03-08 LAB — COMPREHENSIVE METABOLIC PANEL
ALT: 18 U/L (ref 0–44)
AST: 22 U/L (ref 15–41)
Albumin: 4.4 g/dL (ref 3.5–5.0)
Alkaline Phosphatase: 83 U/L (ref 38–126)
Anion gap: 10 (ref 5–15)
BUN: 10 mg/dL (ref 6–20)
CO2: 25 mmol/L (ref 22–32)
Calcium: 9.5 mg/dL (ref 8.9–10.3)
Chloride: 99 mmol/L (ref 98–111)
Creatinine, Ser: 0.51 mg/dL (ref 0.44–1.00)
GFR, Estimated: >60 mL/min (ref >60)
Glucose, Bld: 111 mg/dL — ABNORMAL HIGH (ref 70–99)
Potassium: 3.5 mmol/L (ref 3.5–5.1)
Sodium: 134 mmol/L — ABNORMAL LOW (ref 135–145)
Total Bilirubin: 0.7 mg/dL (ref 0.3–1.2)
Total Protein: 8 g/dL (ref 6.5–8.1)

## 2022-03-08 LAB — BLOOD GAS, VENOUS
Acid-Base Excess: 2.2 mmol/L — ABNORMAL HIGH (ref 0.0–2.0)
Bicarbonate: 27.3 mmol/L (ref 20.0–28.0)
O2 Saturation: 94.3 %
Patient temperature: 37
pCO2, Ven: 43 mmHg — ABNORMAL LOW (ref 44–60)
pH, Ven: 7.41 (ref 7.25–7.43)
pO2, Ven: 70 mmHg — ABNORMAL HIGH (ref 32–45)

## 2022-03-08 LAB — TSH: TSH: 75.666 u[IU]/mL — ABNORMAL HIGH (ref 0.350–4.500)

## 2022-03-08 LAB — PROTIME-INR
INR: 1 (ref 0.8–1.2)
Prothrombin Time: 13.1 seconds (ref 11.4–15.2)

## 2022-03-08 LAB — MAGNESIUM: Magnesium: 1.8 mg/dL (ref 1.7–2.4)

## 2022-03-08 LAB — AMMONIA: Ammonia: 32 umol/L (ref 9–35)

## 2022-03-08 LAB — APTT: aPTT: 31 seconds (ref 24–36)

## 2022-03-08 LAB — ETHANOL: Alcohol, Ethyl (B): <10 mg/dL (ref <10)

## 2022-03-08 MED ORDER — ACETAMINOPHEN 325 MG RE SUPP: 650.0000 mg | Freq: Four times a day (QID) | RECTAL | Status: DC | PRN

## 2022-03-08 MED ORDER — HYDROCODONE-ACETAMINOPHEN 5-325 MG PO TABS: 1.0000 | ORAL_TABLET | ORAL | Status: DC | PRN

## 2022-03-08 MED ORDER — DEXAMETHASONE SODIUM PHOSPHATE 4 MG/ML IJ SOLN
4.0000 mg | Freq: Two times a day (BID) | INTRAMUSCULAR | Status: DC
  Administered 2022-03-09: 4 mg via INTRAVENOUS
  Filled 2022-03-08: qty 1

## 2022-03-08 MED ORDER — LORAZEPAM 2 MG/ML IJ SOLN
2.0000 mg | Freq: Once | INTRAMUSCULAR | Status: AC
  Administered 2022-03-08: 2 mg via INTRAVENOUS
  Filled 2022-03-08: qty 1

## 2022-03-08 MED ORDER — MORPHINE SULFATE (PF) 2 MG/ML IV SOLN: 2.0000 mg | INTRAVENOUS | Status: DC | PRN

## 2022-03-08 MED ORDER — LEVOTHYROXINE SODIUM 50 MCG PO TABS: 50.0000 ug | ORAL_TABLET | Freq: Every day | ORAL | Status: DC

## 2022-03-08 MED ORDER — DEXAMETHASONE SODIUM PHOSPHATE 10 MG/ML IJ SOLN
10.0000 mg | Freq: Once | INTRAMUSCULAR | Status: AC
  Administered 2022-03-08: 10 mg via INTRAVENOUS
  Filled 2022-03-08: qty 1

## 2022-03-08 MED ORDER — GADOBUTROL 1 MMOL/ML IV SOLN
7.0000 mL | Freq: Once | INTRAVENOUS | Status: AC | PRN
  Administered 2022-03-08: 7 mL via INTRAVENOUS

## 2022-03-08 MED ORDER — ACETAMINOPHEN 325 MG PO TABS: 650.0000 mg | ORAL_TABLET | Freq: Four times a day (QID) | ORAL | Status: DC | PRN

## 2022-03-08 MED ORDER — SODIUM CHLORIDE 0.9% FLUSH
3.0000 mL | Freq: Two times a day (BID) | INTRAVENOUS | Status: DC
Start: 2022-03-09 — End: 2022-03-09
  Administered 2022-03-09: 3 mL via INTRAVENOUS

## 2022-03-08 MED ORDER — LEVETIRACETAM IN NACL 1500 MG/100ML IV SOLN
1500.0000 mg | Freq: Once | INTRAVENOUS | Status: AC
  Administered 2022-03-09: 1500 mg via INTRAVENOUS
  Filled 2022-03-08: qty 100

## 2022-03-08 MED ORDER — LEVETIRACETAM IN NACL 1500 MG/100ML IV SOLN
1500.0000 mg | Freq: Once | INTRAVENOUS | Status: AC
  Administered 2022-03-08: 1500 mg via INTRAVENOUS
  Filled 2022-03-08: qty 100

## 2022-03-08 MED ORDER — HALOPERIDOL LACTATE 5 MG/ML IJ SOLN
2.5000 mg | Freq: Once | INTRAMUSCULAR | Status: AC
  Administered 2022-03-08: 2.5 mg via INTRAVENOUS
  Filled 2022-03-08: qty 1

## 2022-03-08 MED ORDER — HYDRALAZINE HCL 20 MG/ML IJ SOLN: 10.0000 mg | INTRAMUSCULAR | Status: DC | PRN

## 2022-03-08 MED ORDER — MAGNESIUM SULFATE 2 GM/50ML IV SOLN
2.0000 g | Freq: Once | INTRAVENOUS | Status: AC
  Administered 2022-03-08: 2 g via INTRAVENOUS
  Filled 2022-03-08: qty 50

## 2022-03-08 MED ORDER — SODIUM CHLORIDE 0.9 % IV SOLN: INTRAVENOUS | Status: DC

## 2022-03-08 NOTE — ED Notes (Addendum)
Ativan 2mg  IV given emergently due to pt combative and uncooperative by Jarrett Soho, RN in [place]

## 2022-03-08 NOTE — ED Triage Notes (Addendum)
Pt to ED via [facility] for AMS. Per EMS pt was sitting outside of unknown house, unknown of LKW. Able to move extremities freely. Pt has weakness and incomprehensible speech. Pt agitated and uncooperative upon assessment.   EMS CBG 130 20 R FA Pt placed on 2L for SPO2 of 88% per EMS.   Pt has hx of lung cancer, with mets to brain.   Pt is alert, unable to orient to place, self, person, situation.

## 2022-03-08 NOTE — ED Notes (Signed)
Hospitalist @ the bedside

## 2022-03-08 NOTE — Assessment & Plan Note (Signed)
-  Nicotine patch 

## 2022-03-08 NOTE — Assessment & Plan Note (Addendum)
Oncology consult, radiation oncology consult for a.m. team. Supportive care with pain meds and antiemetics.

## 2022-03-08 NOTE — Assessment & Plan Note (Signed)
We will defer to a.m. team for discussion with family and further plan of care and options with palliative  care.

## 2022-03-08 NOTE — ED Notes (Signed)
Attempted to collect pt labs x2

## 2022-03-08 NOTE — ED Provider Notes (Signed)
[facility] Provider Note    Event Date/Time   First MD Initiated Contact with Patient 03/08/22 1801     (approximate)   History   Altered Mental Status   HPI  Jenna Newton is a 56 y.o. female  with a history of small cell lung cancer with brain metastases being treated with palliative chemo and radiation, HTN, GERD, anemia, peripheral neuropathy, tobacco abuse who presents via EMS for evaluation of altered mental status.  Unknown last known well.  She was reportedly agitated with EMS and uncooperative and hypoxic requiring 2 L nasal cannula.  In the emergency room SPO2 is 95% on room air.  Patient is grossly altered sitting in 2002 on arrival and unable to provide any additional history.  No additional history is immediately available on patient arrival.    Past Medical History:  Diagnosis Date   Anemia    GERD (gastroesophageal reflux disease)    Hypertension    Neuropathy    Small cell lung cancer ([place])    Tobacco abuse 03/14/2017   Vertigo    White matter disease of brain due to ischemia 12/04/2017   Head CT March 2019     Physical Exam  Triage Vital Signs: ED Triage Vitals  Enc Vitals Group     BP      Pulse      Resp      Temp      Temp src      SpO2      Weight      Height      Head Circumference      Peak Flow      Pain Score      Pain Loc      Pain Edu?      Excl. in [place]?     Most recent vital signs: Vitals:   03/08/22 2245 03/08/22 2257  BP: 123/75   Pulse: 81   Resp: 12   Temp:    SpO2: 91% 91%    General: Awake, seems uncomfortable fidgeting in bed. CV:  Good peripheral perfusion.  2+ radial pulses. Resp:  Normal effort.  Clear bilaterally. Abd:  No distention.  Soft. Other:  PERRLA.  EOMI.  Patient is moving all extremities spontaneously.  She is able to follow some commands in extremities but does not otherwise participate completing her exam.  No obvious trauma to the face scalp head or neck or spinal  tenderness.   ED Results / Procedures / Treatments  Labs (all labs ordered are listed, but only abnormal results are displayed) Labs Reviewed  BLOOD GAS, VENOUS - Abnormal; Notable for the following components:      Result Value   pCO2, Ven 43 (*)    pO2, Ven 70 (*)    Acid-Base Excess 2.2 (*)    All other components within normal limits  CBC WITH DIFFERENTIAL/PLATELET - Abnormal; Notable for the following components:   Hemoglobin 11.8 (*)    HCT 35.7 (*)    Lymphs Abs 0.5 (*)    All other components within normal limits  COMPREHENSIVE METABOLIC PANEL - Abnormal; Notable for the following components:   Sodium 134 (*)    Glucose, Bld 111 (*)    All other components within normal limits  TSH - Abnormal; Notable for the following components:   TSH 75.666 (*)    All other components within normal limits  RESP PANEL BY RT-PCR (FLU A&B, COVID) ARPGX2  ETHANOL  PROTIME-INR  APTT  MAGNESIUM  AMMONIA  CBC WITH DIFFERENTIAL/PLATELET  URINE DRUG SCREEN, QUALITATIVE ([facility] ONLY)  URINALYSIS, COMPLETE (U[facility]MP) WITH MICROSCOPIC     EKG  ECG is markable sinus rhythm with a ventricular rate of 79, normal axis, QTc interval of 527 without other clear evidence of acute ischemia or significant arrhythmia.   RADIOLOGY  CT head on my interpretation shows what appears to be several metastatic lesions and effacement of sulci concerning for edema.  There are to what appears to be other calcifications or microhemorrhages scattered.  I reviewed radiology interpretation and spoke with radiologist and agree with findings of bilateral cortical and subcortical foci of hyperdensities concerning for progression of metastatic disease and less likely foci of hemorrhage as well as a 4 mm midline shift and evidence of edema.  Chest reviewed by myself shows no focal consoidation, effusion, edema, pneumothorax or other clear acute thoracic process. I also reviewed radiology interpretation and agree with  findings described.    PROCEDURES:  Critical Care performed: Yes, see critical care procedure note(s)  .Critical Care  Performed by: Lucrezia Starch, MD Authorized by: Lucrezia Starch, MD   Critical care provider statement:    Critical care time (minutes):  30   Critical care was necessary to treat or prevent imminent or life-threatening deterioration of the following conditions:  CNS failure or compromise and respiratory failure   Critical care was time spent personally by me on the following activities:  Development of treatment plan with patient or surrogate, discussions with consultants, evaluation of patient's response to treatment, examination of patient, ordering and review of laboratory studies, ordering and review of radiographic studies, ordering and performing treatments and interventions, pulse oximetry, re-evaluation of patient's condition and review of old charts     MEDICATIONS ORDERED IN ED: Medications  haloperidol lactate (HALDOL) injection 2.5 mg (2.5 mg Intravenous Given 03/08/22 1820)  LORazepam (ATIVAN) injection 2 mg (2 mg Intravenous Given 03/08/22 1846)  levETIRAcetam (KEPPRA) IVPB 1500 mg/ 100 mL premix (0 mg Intravenous Stopped 03/08/22 2112)  dexamethasone (DECADRON) injection 10 mg (10 mg Intravenous Given 03/08/22 2022)  LORazepam (ATIVAN) injection 2 mg (2 mg Intravenous Given 03/08/22 2030)  gadobutrol (GADAVIST) 1 MMOL/ML injection 7 mL (7 mLs Intravenous Contrast Given 03/08/22 2059)     IMPRESSION / MDM / [place] / ED COURSE  I reviewed the triage vital signs and the nursing notes. Patient's presentation is most consistent with acute presentation with potential threat to life or bodily function.                               Differential diagnosis includes, but is not limited to acute encephalopathy secondary to infection, intoxication, withdrawal, metabolic derangements, metastatic brain disease, CVA, hepatic failure and thyroid  derangements.  CT head on my interpretation shows what appears to be several metastatic lesions and effacement of sulci concerning for edema.  There are to what appears to be other calcifications or microhemorrhages scattered.  I reviewed radiology interpretation and spoke with radiologist and agree with findings of bilateral cortical and subcortical foci of hyperdensities concerning for progression of metastatic disease and less likely foci of hemorrhage as well as a 4 mm midline shift and evidence of edema.  Chest reviewed by myself shows no focal consoidation, effusion, edema, pneumothorax or other clear acute thoracic process. I also reviewed radiology interpretation and agree with findings described.   EKG does not appear  ischemic but does show slightly prolonged QTc interval.  CBC shows no leukocytosis and hemoglobin of 11.8 compared to 12.610 days ago.  Normal platelets.  CMP shows no significant electrolyte or metabolic derangements.  No evidence of acute hepatitis or cholestatic process.  Magnesium 1.8.  We will give some supplemental magnesium to increase to 2 given prolonged QTc interval.  TSH is fairly elevated 75 although it was 130 510 days ago.  COVID PCR is negative.  VBG with pH of 7.4 with PCO2 of 43 not suggestive of acute hypercarbic respiratory failure.  Ammonia WNL.  Ethanol undetectable  Patient was briefly on oxygen on arrival she was able to successfully weaned off and able to maintain sats in the mid 90s.  However during her work-up she was unable to safely follow directions to facilitate safe evaluation and work-up she was given some Haldol and Ativan.  Following Ativan administration her oxygen did decrease again she was placed back on 2 L.  Given concern for altered mental status and due to worsening metastatic brain lesions I consulted with on-call neurosurgeon Dr. Cari Caraway who agreed with some steroids and will see patient tomorrow.  He also agreed with obtaining MRI.   This was ordered.  I will also give an IV dose of Keppra.  Patient will be admitted to medicine service for further evaluation and management.      FINAL CLINICAL IMPRESSION(S) / ED DIAGNOSES   Final diagnoses:  Altered mental status, unspecified altered mental status type  Malignant neoplasm metastatic to brain [facility])  Cerebral edema ([place])     Rx / DC Orders   ED Discharge Orders     None        Note:  This document was prepared using Dragon voice recognition software and may include unintentional dictation errors.   Lucrezia Starch, MD 03/08/22 [phone]

## 2022-03-08 NOTE — Assessment & Plan Note (Signed)
Blood pressure 123/75, pulse 81, temperature (!) 97.5 F (36.4 C), temperature source Oral, resp. rate 12, height 5\' 6"  (1.676 m), weight 71.3 kg, last menstrual period 01/23/2015, SpO2 91 %. Blood pressure control is stable patient is on low-dose amlodipine. Currently we will hold as blood pressure is low normal.

## 2022-03-08 NOTE — H&P (Signed)
History and Physical    Cookie Pore BLT:[id number] DOB: 13-Jun-1966 DOA: 03/08/2022  PCP: Delsa Grana, PA-C    Patient coming from:  Home    Chief Complaint:  AMS.    HPI:  Jenna Newton is a 56 y.o. female seen in ed with complaints of AMS , by EMS. Patient was sitting outside of an Unknown house with unknown last known normal.  Able to move freely this week and speech was slurred patient's initial assessment was consistent with her being restless uncooperative and agitated not following commands.  Patient was noted to be hypoxic at 88% and placed on 2 L of nasal cannula she has past medical history of lung cancer with mets to the brain.  Pt has past medical history of allergies to penicillin, sulfa, meclizine, history of lung cancer, tobacco abuse, anemia, GERD, hypertension, vertigo.  In ed pt was agitated and on my assessment pt was sedated and not responding, she had received haldol and ativan.   ED Course:   Vitals:   03/08/22 2000 03/08/22 2130 03/08/22 2245 03/08/22 2257  BP: 131/78 (!) 146/88 123/75   Pulse: 71 76 81   Resp: 14 15 12    Temp:      TempSrc:      SpO2: 93% 93% 91% 91%  Weight:      Height:        Review of Systems:  Review of Systems  Unable to perform ROS: Mental status change      Past Medical History:  Diagnosis Date   Anemia    GERD (gastroesophageal reflux disease)    Hypertension    Neuropathy    Small cell lung cancer ([place])    Tobacco abuse 03/14/2017   Vertigo    White matter disease of brain due to ischemia 12/04/2017   Head CT March 2019    Past Surgical History:  Procedure Laterality Date   none     PORTA CATH INSERTION N/A 06/18/2021   Procedure: PORTA CATH INSERTION;  Surgeon: Algernon Huxley, MD;  Location: [facility];  Service: Cardiovascular;  Laterality: N/A;   VIDEO BRONCHOSCOPY WITH ENDOBRONCHIAL NAVIGATION N/A 05/25/2021   Procedure: ROBOTIC ASSISTED VIDEO BRONCHOSCOPY WITH ENDOBRONCHIAL NAVIGATION;   Surgeon: Tyler Pita, MD;  Location: [facility];  Service: Pulmonary;  Laterality: N/A;   VIDEO BRONCHOSCOPY WITH ENDOBRONCHIAL ULTRASOUND N/A 05/25/2021   Procedure: VIDEO BRONCHOSCOPY WITH ENDOBRONCHIAL ULTRASOUND;  Surgeon: Tyler Pita, MD;  Location: [facility];  Service: Pulmonary;  Laterality: N/A;     reports that she has been smoking cigarettes. She has a 25.00 pack-year smoking history. She has never used smokeless tobacco. She reports current alcohol use of about 14.0 standard drinks of alcohol per week. She reports that she does not use drugs.  Allergies  Allergen Reactions   Penicillins Swelling   Meclizine Other (See Comments)    Gave pt headaches    Sulfa Antibiotics Swelling    Family History  Problem Relation Age of Onset   Diabetes Mother    Hypertension Mother    Emphysema Mother    Emphysema Father    Diabetes Brother    Alcohol abuse Brother    Diabetes Maternal Grandmother    Hypertension Maternal Grandmother    Blindness Maternal Grandmother    Cancer Maternal Grandfather        prostate   Emphysema Maternal Grandfather    Cancer Paternal Grandmother    Emphysema Paternal Grandfather     Prior to  Admission medications   Medication Sig Start Date End Date Taking? Authorizing Provider  amLODipine (NORVASC) 2.5 MG tablet Take 1 tablet (2.5 mg total) by mouth daily. 12/06/21   Delsa Grana, PA-C  atorvastatin (LIPITOR) 20 MG tablet Take 1 tablet (20 mg total) by mouth at bedtime. 1 Tablet(s) By Mouth Every Night 12/17/21   Delsa Grana, PA-C  cromolyn (OPTICROM) 4 % ophthalmic solution Place 1 drop into both eyes 4 (four) times daily as needed. 12/01/20   Delsa Grana, PA-C  fluticasone (FLONASE) 50 MCG/ACT nasal spray Place 1 spray into both nostrils daily as needed for allergies. 11/30/20   [provider]  gabapentin (NEURONTIN) 100 MG capsule Take 1 capsule (100 mg total) by mouth at bedtime. 02/22/22   Earlie Server, MD  hydrocortisone cream 0.5 %  Apply 1 application. topically 2 (two) times daily as needed for itching. 12/04/21   Earlie Server, MD  levETIRAcetam (KEPPRA) 500 MG tablet Take 1 tablet (500 mg total) by mouth 2 (two) times daily. 12/04/21   Earlie Server, MD  levothyroxine (SYNTHROID) 50 MCG tablet Take 1 tablet (50 mcg total) by mouth daily before breakfast. 02/23/22   Earlie Server, MD  lidocaine-prilocaine (EMLA) cream Apply 1 application topically as needed. 06/25/21   Earlie Server, MD  montelukast (SINGULAIR) 10 MG tablet Take 1 tablet (10 mg total) by mouth at bedtime. 09/11/20   Delsa Grana, PA-C  ondansetron (ZOFRAN) 8 MG tablet Take 1 tablet (8 mg total) by mouth 2 (two) times daily as needed for refractory nausea / vomiting. Start on day 3 after carboplatin chemo. Patient not taking: Reported on 02/22/2022 06/01/21   Earlie Server, MD  prochlorperazine (COMPAZINE) 10 MG tablet Take 1 tablet (10 mg total) by mouth every 6 (six) hours as needed (Nausea or vomiting). Patient not taking: Reported on 02/22/2022 06/01/21   Earlie Server, MD  saline (AYR) GEL Place 1 application into the nose every 4 (four) hours. 12/06/19   Delsa Grana, PA-C  Tiotropium Bromide-Olodaterol (STIOLTO RESPIMAT) 2.5-2.5 MCG/ACT AERS Inhale 2 puffs into the lungs daily. Patient not taking: Reported on 02/22/2022 05/16/21   Tyler Pita, MD  vitamin B-12 (CYANOCOBALAMIN) 500 MCG tablet Take 1 tablet (500 mcg total) by mouth daily. 09/11/20   Delsa Grana, PA-C    Physical Exam: Vitals:   03/08/22 2000 03/08/22 2130 03/08/22 2245 03/08/22 2257  BP: 131/78 (!) 146/88 123/75   Pulse: 71 76 81   Resp: 14 15 12    Temp:      TempSrc:      SpO2: 93% 93% 91% 91%  Weight:      Height:       Physical Exam Vitals and nursing note reviewed.  Constitutional:      General: She is not in acute distress.    Appearance: Normal appearance. She is not ill-appearing, toxic-appearing or diaphoretic.     Interventions: She is sedated.  HENT:     Head: Normocephalic and atraumatic.      Right Ear: Hearing and external ear normal.     Left Ear: Hearing and external ear normal.     Nose: Nose normal. No nasal deformity.     Mouth/Throat:     Lips: Pink.     Tongue: No lesions.  Eyes:     General: Lids are normal.     Pupils: Pupils are equal, round, and reactive to light.  Cardiovascular:     Rate and Rhythm: Normal rate and regular rhythm.  Pulses: Normal pulses.          Dorsalis pedis pulses are 2+ on the right side and 2+ on the left side.       Posterior tibial pulses are 2+ on the right side and 2+ on the left side.     Heart sounds: Normal heart sounds.  Pulmonary:     Effort: Pulmonary effort is normal.     Breath sounds: Normal breath sounds.  Abdominal:     General: Bowel sounds are normal. There is no distension.     Palpations: Abdomen is soft. There is no mass.     Tenderness: There is no abdominal tenderness. There is no guarding.     Hernia: No hernia is present.  Musculoskeletal:     Right lower leg: No edema.     Left lower leg: No edema.  Skin:    General: Skin is warm.  Neurological:     Cranial Nerves: Cranial nerves 2-12 are intact.     Motor: Motor function is intact.  Psychiatric:        Attention and Perception: Attention normal.        Speech: Speech normal.        Cognition and Memory: Cognition normal.     Labs on Admission: I have personally reviewed following labs and imaging studies BMET Recent Labs  Lab 03/08/22 1826  NA 134*  K 3.5  CL 99  CO2 25  BUN 10  CREATININE 0.51  GLUCOSE 111*   Electrolytes Recent Labs  Lab 03/08/22 1826  CALCIUM 9.5  MG 1.8   Sepsis Markers No results for input(s): "LATICACIDVEN", "PROCALCITON", "O2SATVEN" in the last 168 hours. ABG No results for input(s): "PHART", "PCO2ART", "PO2ART" in the last 168 hours. Liver Enzymes Recent Labs  Lab 03/08/22 1826  AST 22  ALT 18  ALKPHOS 83  BILITOT 0.7  ALBUMIN 4.4   Cardiac Enzymes No results for input(s): "TROPONINI",  "PROBNP" in the last 168 hours. No results found for: "DDIMER" Coag's Recent Labs  Lab 03/08/22 1835  APTT 31  INR 1.0    Recent Results (from the past 240 hour(s))  Resp Panel by RT-PCR (Flu A&B, Covid) Anterior Nasal Swab     Status: None   Collection Time: 03/08/22  6:30 PM   Specimen: Anterior Nasal Swab  Result Value Ref Range Status   SARS Coronavirus 2 by RT PCR NEGATIVE NEGATIVE Final    Comment: (NOTE) SARS-CoV-2 target nucleic acids are NOT DETECTED.  The SARS-CoV-2 RNA is generally detectable in upper respiratory specimens during the acute phase of infection. The lowest concentration of SARS-CoV-2 viral copies this assay can detect is 138 copies/mL. A negative result does not preclude SARS-Cov-2 infection and should not be used as the sole basis for treatment or other patient management decisions. A negative result may occur with  improper specimen collection/handling, submission of specimen other than nasopharyngeal swab, presence of viral mutation(s) within the areas targeted by this assay, and inadequate number of viral copies(<138 copies/mL). A negative result must be combined with clinical observations, patient history, and epidemiological information. The expected result is Negative.  Fact Sheet for Patients:  EntrepreneurPulse.com.au  Fact Sheet for Healthcare Providers:  IncredibleEmployment.be  This test is no t yet approved or cleared by the [place] FDA and  has been authorized for detection and/or diagnosis of SARS-CoV-2 by FDA under an Emergency Use Authorization (EUA). This EUA will remain  in effect (meaning this test can be used)  for the duration of the COVID-19 declaration under Section 564(b)(1) of the Act, 21 U.S.C.section 360bbb-3(b)(1), unless the authorization is terminated  or revoked sooner.       Influenza A by PCR NEGATIVE NEGATIVE Final   Influenza B by PCR NEGATIVE NEGATIVE Final     Comment: (NOTE) The Xpert Xpress SARS-CoV-2/FLU/RSV plus assay is intended as an aid in the diagnosis of influenza from Nasopharyngeal swab specimens and should not be used as a sole basis for treatment. Nasal washings and aspirates are unacceptable for Xpert Xpress SARS-CoV-2/FLU/RSV testing.  Fact Sheet for Patients: EntrepreneurPulse.com.au  Fact Sheet for Healthcare Providers: IncredibleEmployment.be  This test is not yet approved or cleared by the [place] FDA and has been authorized for detection and/or diagnosis of SARS-CoV-2 by FDA under an Emergency Use Authorization (EUA). This EUA will remain in effect (meaning this test can be used) for the duration of the COVID-19 declaration under Section 564(b)(1) of the Act, 21 U.S.C. section 360bbb-3(b)(1), unless the authorization is terminated or revoked.  Performed at [facility], [address] [place], [place] [address]      Current Facility-Administered Medications:    magnesium sulfate IVPB 2 g 50 mL, 2 g, Intravenous, Once, Lucrezia Starch, MD  Current Outpatient Medications:    amLODipine (NORVASC) 2.5 MG tablet, Take 1 tablet (2.5 mg total) by mouth daily., Disp: 90 tablet, Rfl: 1   atorvastatin (LIPITOR) 20 MG tablet, Take 1 tablet (20 mg total) by mouth at bedtime. 1 Tablet(s) By Mouth Every Night, Disp: 90 tablet, Rfl: 1   gabapentin (NEURONTIN) 100 MG capsule, Take 1 capsule (100 mg total) by mouth at bedtime., Disp: 30 capsule, Rfl: 0   hydrocortisone cream 0.5 %, Apply 1 application. topically 2 (two) times daily as needed for itching., Disp: 30 g, Rfl: 3   KLOR-CON M20 20 MEQ tablet, Take 20 mEq by mouth daily., Disp: , Rfl:    levothyroxine (SYNTHROID) 50 MCG tablet, Take 1 tablet (50 mcg total) by mouth daily before breakfast., Disp: 30 tablet, Rfl: 0   lidocaine-prilocaine (EMLA) cream, Apply 1 application topically as needed., Disp: 30 g, Rfl: 3   saline  (AYR) GEL, Place 1 application into the nose every 4 (four) hours., Disp: 14.1 g, Rfl: 3   vitamin B-12 (CYANOCOBALAMIN) 500 MCG tablet, Take 1 tablet (500 mcg total) by mouth daily., Disp: 100 tablet, Rfl: 3   cromolyn (OPTICROM) 4 % ophthalmic solution, Place 1 drop into both eyes 4 (four) times daily as needed. (Patient not taking: Reported on 03/08/2022), Disp: 10 mL, Rfl: 3   fluticasone (FLONASE) 50 MCG/ACT nasal spray, Place 1 spray into both nostrils daily as needed for allergies. (Patient not taking: Reported on 03/08/2022), Disp: , Rfl:    levETIRAcetam (KEPPRA) 500 MG tablet, Take 1 tablet (500 mg total) by mouth 2 (two) times daily. (Patient not taking: Reported on 03/08/2022), Disp: 120 tablet, Rfl: 0   montelukast (SINGULAIR) 10 MG tablet, Take 1 tablet (10 mg total) by mouth at bedtime. (Patient not taking: Reported on 03/08/2022), Disp: 90 tablet, Rfl: 3   ondansetron (ZOFRAN) 8 MG tablet, Take 1 tablet (8 mg total) by mouth 2 (two) times daily as needed for refractory nausea / vomiting. Start on day 3 after carboplatin chemo. (Patient not taking: Reported on 02/22/2022), Disp: 30 tablet, Rfl: 1   prochlorperazine (COMPAZINE) 10 MG tablet, Take 1 tablet (10 mg total) by mouth every 6 (six) hours as needed (Nausea or vomiting). (Patient  not taking: Reported on 02/22/2022), Disp: 30 tablet, Rfl: 1   Tiotropium Bromide-Olodaterol (STIOLTO RESPIMAT) 2.5-2.5 MCG/ACT AERS, Inhale 2 puffs into the lungs daily. (Patient not taking: Reported on 02/22/2022), Disp: 1 each, Rfl: 0  COVID-19 Labs No results for input(s): "DDIMER", "FERRITIN", "LDH", "CRP" in the last 72 hours. Lab Results  Component Value Date   SARSCOV2NAA NEGATIVE 03/08/2022   [place] NEGATIVE 05/23/2021   [place] NEGATIVE 11/30/2019    Radiological Exams on Admission: CT HEAD WO CONTRAST (5MM)  Result Date: 03/08/2022 CLINICAL DATA:  Mental status change EXAM: CT HEAD WITHOUT CONTRAST TECHNIQUE: Contiguous axial images were  obtained from the base of the skull through the vertex without intravenous contrast. RADIATION DOSE REDUCTION: This exam was performed according to the departmental dose-optimization program which includes automated exposure control, adjustment of the mA and/or kV according to patient size and/or use of iterative reconstruction technique. COMPARISON:  CT brain 05/08/2021 FINDINGS: Brain: Multiple sequences are degraded by patient motion. Generalized sulcal effacement consistent with edema. Numerous bilateral mostly cortical and subcortical foci of hyperdensity, some of which are calcified and others of which are noncalcified. Extensive low-density edema within bilateral white matter, slightly worse on the left side. About 4 mm midline shift to the right. The ventricles are non dilated. Vascular: No hyperdense vessels.  Carotid vascular calcification Skull: Normal. Negative for fracture or focal lesion. Sinuses/Orbits: No acute finding. Other: None IMPRESSION: 1. Numerous bilateral mostly cortical and subcortical foci of hyperdensity, both calcified and non calcified with interim extensive bilateral white matter edema, suspect that the findings are due to progression of metastatic disease, less likely scattered foci of hemorrhage/blood product. There is approximate 4 mm midline shift to the right. There is generalized sulcal effacement consistent with edema. Critical Value/emergent results were called by telephone at the time of interpretation on 03/08/2022 at 7:37 pm to provider [facility] , who verbally acknowledged these results. Electronically Signed   By: Donavan Foil M.D.   On: 03/08/2022 19:37   DG Chest Port 1 View  Result Date: 03/08/2022 CLINICAL DATA:  Altered mental status and weakness EXAM: PORTABLE CHEST 1 VIEW COMPARISON:  02/26/2022 FINDINGS: Cardiac shadow is within normal limits. Right chest wall port is again seen and stable. Lungs are well aerated bilaterally. No focal infiltrate or sizable  effusion is seen. IMPRESSION: No acute abnormality noted. Electronically Signed   By: Inez Catalina M.D.   On: 03/08/2022 19:12    EKG: Independently reviewed.  Sinus rhythm 79. PR interval prolonged at 221, QTc corrected and prolonged at 527, Otherwise EKG nonischemic        Assessment and Plan: * Altered mental status Patient has a history of small cell lung cancer with lesions in the brain. Head CT noncontrast done stat in the emergency room shows: 1. Numerous bilateral mostly cortical and subcortical foci of hyperdensity, both calcified and non calcified with interim extensive bilateral white matter edema, suspect that the findings are due to progression of metastatic disease, less likely scattered foci of hemorrhage/blood product. There is approximate 4 mm midline shift to the right. There is generalized sulcal effacement consistent with edema. -Neurocheck -Aspiration/fall/seizure precautions -Continue with Decadron and Keppra. Prognosis is poor.  Small cell lung cancer [facility]) Oncology consult, radiation oncology consult for a.m. team. Supportive care with pain meds and antiemetics.   Benign hypertension Blood pressure 123/75, pulse 81, temperature (!) 97.5 F (36.4 C), temperature source Oral, resp. rate 12, height 5\' 6"  (1.676 m), weight 71.3 kg, last  menstrual period 01/23/2015, SpO2 91 %. Blood pressure control is stable patient is on low-dose amlodipine. Currently we will hold as blood pressure is low normal.   Goals of care, counseling/discussion We will defer to a.m. team for discussion with family and further plan of care and options with palliative  care.   Tobacco abuse Nicotine patch.      DVT prophylaxis:  scd's   Code Status:  Full code    Family Communication:  Joanna Hews (Brother)  [phone] (Mobile)   Disposition Plan:  Inpatient.    Consults called:  Dr.Yarbrough. - NS.   Admission status: Observation.        Para Skeans MD [facility]  6 PM- 2 AM. Please contact me via secure Chat 6 PM-2 AM. [phone] ( Pager ) To contact the [facility] Attending or Consulting provider [place] or covering provider during after hours [place], for this patient.   Check the care team in [facility] and look for a) attending/consulting [facility] provider listed and b) the [facility] team listed Log into [email or web address] and use [place]'s universal password to access. If you do not have the password, please contact the hospital operator. Locate the [facility] provider you are looking for under [facility] and page to a number that you can be directly reached. If you still have difficulty reaching the provider, please page the [facility] (Director on Call) for the Hospitalists listed on amion for assistance. [email or web address] 03/08/2022, 11:18 PM

## 2022-03-08 NOTE — Assessment & Plan Note (Addendum)
Patient has a history of small cell lung cancer with lesions in the brain. Head CT noncontrast done stat in the emergency room shows: 1. Numerous bilateral mostly cortical and subcortical foci of hyperdensity, both calcified and non calcified with interim extensive bilateral white matter edema, suspect that the findings are due to progression of metastatic disease, less likely scattered foci of hemorrhage/blood product. There is approximate 4 mm midline shift to the right. There is generalized sulcal effacement consistent with edema. -Neurocheck -Aspiration/fall/seizure precautions -Continue with Decadron and Keppra. Prognosis is poor.

## 2022-03-08 NOTE — ED Notes (Signed)
Unable to in and out cath due to pt aggression, and uncooperativeness

## 2022-03-09 DIAGNOSIS — C3490 Malignant neoplasm of unspecified part of unspecified bronchus or lung: Secondary | ICD-10-CM | POA: Diagnosis not present

## 2022-03-09 DIAGNOSIS — G936 Cerebral edema: Secondary | ICD-10-CM

## 2022-03-09 DIAGNOSIS — C7931 Secondary malignant neoplasm of brain: Secondary | ICD-10-CM

## 2022-03-09 DIAGNOSIS — E039 Hypothyroidism, unspecified: Secondary | ICD-10-CM | POA: Diagnosis not present

## 2022-03-09 DIAGNOSIS — R4182 Altered mental status, unspecified: Secondary | ICD-10-CM | POA: Diagnosis not present

## 2022-03-09 LAB — TSH: TSH: 32.48 u[IU]/mL — ABNORMAL HIGH (ref 0.350–4.500)

## 2022-03-09 LAB — URINALYSIS, COMPLETE (UACMP) WITH MICROSCOPIC
Bacteria, UA: NONE SEEN
Bilirubin Urine: NEGATIVE
Glucose, UA: NEGATIVE mg/dL
Hgb urine dipstick: NEGATIVE
Ketones, ur: NEGATIVE mg/dL
Leukocytes,Ua: NEGATIVE
Nitrite: NEGATIVE
Protein, ur: NEGATIVE mg/dL
Specific Gravity, Urine: 1.011 (ref 1.005–1.030)
pH: 5 (ref 5.0–8.0)

## 2022-03-09 LAB — CBC
HCT: 35.7 % — ABNORMAL LOW (ref 36.0–46.0)
Hemoglobin: 12 g/dL (ref 12.0–15.0)
MCH: 29.9 pg (ref 26.0–34.0)
MCHC: 33.6 g/dL (ref 30.0–36.0)
MCV: 88.8 fL (ref 80.0–100.0)
Platelets: 243 10*3/uL (ref 150–400)
RBC: 4.02 MIL/uL (ref 3.87–5.11)
RDW: 15.8 % — ABNORMAL HIGH (ref 11.5–15.5)
WBC: 5.5 10*3/uL (ref 4.0–10.5)
nRBC: 0 % (ref 0.0–0.2)

## 2022-03-09 LAB — COMPREHENSIVE METABOLIC PANEL
ALT: 18 U/L (ref 0–44)
AST: 23 U/L (ref 15–41)
Albumin: 4.5 g/dL (ref 3.5–5.0)
Alkaline Phosphatase: 85 U/L (ref 38–126)
Anion gap: 12 (ref 5–15)
BUN: 10 mg/dL (ref 6–20)
CO2: 24 mmol/L (ref 22–32)
Calcium: 9.8 mg/dL (ref 8.9–10.3)
Chloride: 100 mmol/L (ref 98–111)
Creatinine, Ser: 0.45 mg/dL (ref 0.44–1.00)
GFR, Estimated: >60 mL/min (ref >60)
Glucose, Bld: 113 mg/dL — ABNORMAL HIGH (ref 70–99)
Potassium: 3.8 mmol/L (ref 3.5–5.1)
Sodium: 136 mmol/L (ref 135–145)
Total Bilirubin: 0.5 mg/dL (ref 0.3–1.2)
Total Protein: 8.3 g/dL — ABNORMAL HIGH (ref 6.5–8.1)

## 2022-03-09 LAB — URINE DRUG SCREEN, QUALITATIVE (ARMC ONLY)
Amphetamines, Ur Screen: NOT DETECTED
Barbiturates, Ur Screen: NOT DETECTED
Benzodiazepine, Ur Scrn: POSITIVE — AB
Cannabinoid 50 Ng, Ur ~~LOC~~: NOT DETECTED
Cocaine Metabolite,Ur ~~LOC~~: NOT DETECTED
MDMA (Ecstasy)Ur Screen: NOT DETECTED
Methadone Scn, Ur: NOT DETECTED
Opiate, Ur Screen: NOT DETECTED
Phencyclidine (PCP) Ur S: NOT DETECTED
Tricyclic, Ur Screen: NOT DETECTED

## 2022-03-09 LAB — T4, FREE: Free T4: 0.4 ng/dL — ABNORMAL LOW (ref 0.61–1.12)

## 2022-03-09 LAB — HIV ANTIBODY (ROUTINE TESTING W REFLEX): HIV Screen 4th Generation wRfx: NONREACTIVE

## 2022-03-09 LAB — CALCIUM: Calcium: 9.7 mg/dL (ref 8.9–10.3)

## 2022-03-09 NOTE — ED Notes (Signed)
Yellow socks and armband placed, bed alarm in place and turned on, seizure pads placed.

## 2022-03-09 NOTE — ED Notes (Signed)
Family in room, MD notified that family would like to speak to MD. Pt alert, recognizes family members and calls them by name.

## 2022-03-09 NOTE — ED Notes (Signed)
Hospice staff in room with pt and pt family.

## 2022-03-09 NOTE — ED Notes (Signed)
Pt responsive to voice, pt verbalizing at times, following commands at times. Pt bed wet with urine, pt cleaned, bedding and gown changed. Pt tolerated well. Pt with stage one decubitus on upper right buttock, 1cm x 1 cm.

## 2022-03-09 NOTE — Progress Notes (Signed)
[place] [facility] Liaison Note  Received request from Dr. Posey Pronto for referral for hospice home. Met with patient and brother, Eulas Post, and his spouse; to confirm interest and explain services.  Family agreeable to transfer today.   RN please call report to [phone]  Please send completed and signed DNR with patient.   Please call with any hospice related questions.   Thank you,  Bea Laura MSN [facility] Liaison [phone]

## 2022-03-09 NOTE — Consult Note (Signed)
Referring Physician:  No referring provider defined for this encounter.  Primary Physician:  Delsa Grana, PA-C  Reason for consultation: worsening intracranial metastatic disease  History of Present Illness: 03/09/2022 Ms. Jenna Newton is here today with a chief complaint of altered mental status.    Please note that the patient is unable to provide a history, as she has profound expressive aphasia and is unable to communicate with the examiner.  She is unable to participate in obtaining history and other correlating information.  Extensive chart review was performed.  The patient was brought into the emergency department for altered mental status and was noted to be significantly agitated.  The patient was also hypoxic and was placed on oxygen.  During work-up, worsening intracranial metastatic disease was noted.  I was consulted for treatment recommendations.  Her oncologic history is well noted in Dr. Collie Siad note from February 22, 2022.  The patient unfortunately was diagnosed with small cell lung cancer metastatic to the brain in the fall 2022.  She has undergone radiation of the brain.   Review of Systems:  A 10 point review of systems is unobtainable  Past Medical History: Past Medical History:  Diagnosis Date   Anemia    GERD (gastroesophageal reflux disease)    Hypertension    Neuropathy    Small cell lung cancer ([place])    Tobacco abuse 03/14/2017   Vertigo    White matter disease of brain due to ischemia 12/04/2017   Head CT March 2019    Past Surgical History: Past Surgical History:  Procedure Laterality Date   none     PORTA CATH INSERTION N/A 06/18/2021   Procedure: PORTA CATH INSERTION;  Surgeon: Algernon Huxley, MD;  Location: [facility];  Service: Cardiovascular;  Laterality: N/A;   VIDEO BRONCHOSCOPY WITH ENDOBRONCHIAL NAVIGATION N/A 05/25/2021   Procedure: ROBOTIC ASSISTED VIDEO BRONCHOSCOPY WITH ENDOBRONCHIAL NAVIGATION;  Surgeon: Tyler Pita,  MD;  Location: [facility];  Service: Pulmonary;  Laterality: N/A;   VIDEO BRONCHOSCOPY WITH ENDOBRONCHIAL ULTRASOUND N/A 05/25/2021   Procedure: VIDEO BRONCHOSCOPY WITH ENDOBRONCHIAL ULTRASOUND;  Surgeon: Tyler Pita, MD;  Location: [facility];  Service: Pulmonary;  Laterality: N/A;    Allergies: Allergies as of 03/08/2022 - Review Complete 03/08/2022  Allergen Reaction Noted   Penicillins Swelling 10/07/2018   Meclizine Other (See Comments) 03/24/2017   Sulfa antibiotics Swelling 04/09/2021    Medications: Current Meds  Medication Sig   amLODipine (NORVASC) 2.5 MG tablet Take 1 tablet (2.5 mg total) by mouth daily.   atorvastatin (LIPITOR) 20 MG tablet Take 1 tablet (20 mg total) by mouth at bedtime. 1 Tablet(s) By Mouth Every Night   gabapentin (NEURONTIN) 100 MG capsule Take 1 capsule (100 mg total) by mouth at bedtime.   hydrocortisone cream 0.5 % Apply 1 application. topically 2 (two) times daily as needed for itching.   KLOR-CON M20 20 MEQ tablet Take 20 mEq by mouth daily.   levothyroxine (SYNTHROID) 50 MCG tablet Take 1 tablet (50 mcg total) by mouth daily before breakfast.   lidocaine-prilocaine (EMLA) cream Apply 1 application topically as needed.   saline (AYR) GEL Place 1 application into the nose every 4 (four) hours.   vitamin B-12 (CYANOCOBALAMIN) 500 MCG tablet Take 1 tablet (500 mcg total) by mouth daily.    Social History: Social History   Tobacco Use   Smoking status: Every Day    Packs/day: 1.00    Years: 25.00    Total  pack years: 25.00    Types: Cigarettes   Smokeless tobacco: Never  Vaping Use   Vaping Use: Never used  Substance Use Topics   Alcohol use: Yes    Alcohol/week: 14.0 standard drinks of alcohol    Types: 14 Cans of beer per week    Comment: 12 pack/ week   Drug use: No    Family Medical History: Family History  Problem Relation Age of Onset   Diabetes Mother    Hypertension Mother    Emphysema Mother    Emphysema Father     Diabetes Brother    Alcohol abuse Brother    Diabetes Maternal Grandmother    Hypertension Maternal Grandmother    Blindness Maternal Grandmother    Cancer Maternal Grandfather        prostate   Emphysema Maternal Grandfather    Cancer Paternal Grandmother    Emphysema Paternal Grandfather     Physical Examination: Vitals:   03/09/22 0630 03/09/22 0745  BP: 105/72   Pulse: 78 66  Resp: 13 11  Temp:    SpO2: 95% 100%    General: Patient is well developed, well nourished, calm, collected, and in no apparent distress. Attention to examination is inappropriate.  Psychiatric: Patient is non-anxious.  Head:  Pupils equal, round, and reactive to light.  ENT:  Oral mucosa appears well hydrated.  Neck:   Supple.  Appears to have full range of motion  Respiratory: Patient is breathing without any difficulty on oxygen via nasal cannula.   Extremities: No edema.  Vascular: Palpable dorsal pedal pulses.  Skin:   On exposed skin, there are no abnormal skin lesions.  Heart:   Normal RR. No MRG  NEUROLOGICAL:     She opens eyes to voice.  She does regard.  She does not follow commands.  She does not name objects.  She has paucity of speech output.  She is unable to repeat.   She does not answer orientation questions.  Pupils equal round reactive to light 3 to 2 mm bilaterally.  Facial tone is symmetric.  Facial sensation appears symmetric. Shoulder shrug is symmetric. Tongue protrusion is midline.    She does not cooperate with pronator drift.  She does withdraw all 4 limbs symmetrically to painful stimuli.  She does not cooperate with full motor examination, but does appear to move all major muscle groups in her arms and legs.  Sensory examination is unreliable.  Reflexes emanation shows 1+ reflexes throughout.  Hoffmann's is absent.  Gait is not tested due to unreliability of patient with exam.      Medical Decision Making  Imaging: MRI Brain 03/08/22 IMPRESSION: Severely  worsened intracranial metastatic disease with severe edema throughout most of the brain. Measurement of lesions is limited by the degree of motion artifact, but the largest lesions are in the posterior parietal lobes.     Electronically Signed   By: Ulyses Jarred M.D.   On: 03/08/2022 21:20  I have personally reviewed the images and agree with the above interpretation.  Assessment and Plan: Jenna Newton is a pleasant 56 y.o. female with worsening metastatic disease to the brain due to metastatic small cell lung cancer.  She has previously undergone radiation treatment.  She is currently suffering from significant edema in her white matter which is causing brain compression due to the presence of multiple intracranial metastases.  I think her underlying altered mental status is likely secondary to her worsening intracranial metastatic disease.  At this  point, she has relatively poor Karnofsky performance score.  There is no role for surgical intervention at this time.  I have recommended oncology and radiation oncology be involved for guidance.  I would also suggest consultation of palliative care, as I think hospice is the most appropriate recommendation at this time unless there are options for additional radiation treatment.  I would suggest continuing symptomatic treatment of her intracranial edema with dexamethasone guided by medicine and oncology and continuation of her antiepileptic medication.  I will defer further suggestions to the medical and radiation oncology teams.   Thank you for involving me in the care of this patient.   This note was partially dictated using voice recognition software, so please excuse any errors that were not corrected.   Kimberlly Norgard K. Izora Ribas MD, [facility] Neurosurgery  Level 5 qualifier - patient unable to participate in history due to altered mental status

## 2022-03-09 NOTE — ED Notes (Signed)
This RN attempted to get patient to eat or drink, pt refused stating that she feel like eating or drinking at this moment."

## 2022-03-09 NOTE — Discharge Summary (Signed)
Jenna Newton FIE:[id number] DOB: 01-19-1966 DOA: 03/08/2022  PCP: Delsa Grana, PA-C  Admit date: 03/08/2022 Discharge date: 03/09/2022  Admitted From: home Disposition:  hospice     Discharge Condition:Stable CODE STATUS:DNR  Diet recommendation: regular   Brief/Interim Summary: Jenna Newton is a 56 y.o. female seen in ed with complaints of AMS , by EMS. Patient was sitting outside of an Unknown house with unknown last known normal.  Able to move freely this week and speech was slurred patient's initial assessment was consistent with her being restless uncooperative and agitated not following commands.  Patient was noted to be hypoxic at 88% and placed on 2 L of nasal cannula she has past medical history of lung cancer with mets to the brain. MRI shows worsened intracranial disease. Patients oncologist recommended hospice as she is not a candidate for radiation therapy. Neurosurgery consulted and also recommended hospice.  Discharge Diagnoses:  Principal Problem:   Altered mental status Active Problems:   Hypothyroidism   Tobacco abuse   Goals of care, counseling/discussion   Benign hypertension   Small cell lung cancer ([place])   AMS (altered mental status)    Discharge Instructions  Discharge Instructions     Diet - low sodium heart healthy   Complete by: As directed    Increase activity slowly   Complete by: As directed       Allergies as of 03/09/2022       Reactions   Penicillins Swelling   Meclizine Other (See Comments)   Gave pt headaches    Sulfa Antibiotics Swelling        Medication List     STOP taking these medications    amLODipine 2.5 MG tablet Commonly known as: NORVASC   atorvastatin 20 MG tablet Commonly known as: LIPITOR   cromolyn 4 % ophthalmic solution Commonly known as: OPTICROM   fluticasone 50 MCG/ACT nasal spray Commonly known as: FLONASE   gabapentin 100 MG capsule Commonly known as: Neurontin   hydrocortisone cream 0.5  %   Klor-Con M20 20 MEQ tablet Generic drug: potassium chloride SA   levETIRAcetam 500 MG tablet Commonly known as: KEPPRA   levothyroxine 50 MCG tablet Commonly known as: Synthroid   lidocaine-prilocaine cream Commonly known as: EMLA   montelukast 10 MG tablet Commonly known as: SINGULAIR   ondansetron 8 MG tablet Commonly known as: Zofran   prochlorperazine 10 MG tablet Commonly known as: COMPAZINE   saline Gel   Stiolto Respimat 2.5-2.5 MCG/ACT Aers Generic drug: Tiotropium Bromide-Olodaterol   vitamin B-12 500 MCG tablet Commonly known as: CYANOCOBALAMIN        Allergies  Allergen Reactions   Penicillins Swelling   Meclizine Other (See Comments)    Gave pt headaches    Sulfa Antibiotics Swelling    Consultations: Hospice Neurosurgery    Procedures/Studies: MR Brain W and Wo Contrast  Result Date: 03/08/2022 CLINICAL DATA:  Brain metastases EXAM: MRI HEAD WITHOUT AND WITH CONTRAST TECHNIQUE: Multiplanar, multiecho pulse sequences of the brain and surrounding structures were obtained without and with intravenous contrast. CONTRAST:  16mL GADAVIST GADOBUTROL 1 MMOL/ML IV SOLN COMPARISON:  05/08/2021 FINDINGS: The examination is degraded by motion. Brain: Intraparenchymal metastatic disease has greatly worsened with severe edema throughout most of the brain. Measurement of lesions is limited by the degree of motion, but the largest lesions are in the posterior parietal lobes and both frontal lobes. There is no hydrocephalus. Minimal rightward midline shift. No acute hemorrhage. Vascular: Major flow voids are preserved.  Skull and upper cervical spine: Normal calvarium and skull base. Visualized upper cervical spine and soft tissues are normal. Sinuses/Orbits:No paranasal sinus fluid levels or advanced mucosal thickening. No mastoid or middle ear effusion. Normal orbits. IMPRESSION: Severely worsened intracranial metastatic disease with severe edema throughout most of  the brain. Measurement of lesions is limited by the degree of motion artifact, but the largest lesions are in the posterior parietal lobes. Electronically Signed   By: Ulyses Jarred M.D.   On: 03/08/2022 21:20   CT HEAD WO CONTRAST (5MM)  Result Date: 03/08/2022 CLINICAL DATA:  Mental status change EXAM: CT HEAD WITHOUT CONTRAST TECHNIQUE: Contiguous axial images were obtained from the base of the skull through the vertex without intravenous contrast. RADIATION DOSE REDUCTION: This exam was performed according to the departmental dose-optimization program which includes automated exposure control, adjustment of the mA and/or kV according to patient size and/or use of iterative reconstruction technique. COMPARISON:  CT brain 05/08/2021 FINDINGS: Brain: Multiple sequences are degraded by patient motion. Generalized sulcal effacement consistent with edema. Numerous bilateral mostly cortical and subcortical foci of hyperdensity, some of which are calcified and others of which are noncalcified. Extensive low-density edema within bilateral white matter, slightly worse on the left side. About 4 mm midline shift to the right. The ventricles are non dilated. Vascular: No hyperdense vessels.  Carotid vascular calcification Skull: Normal. Negative for fracture or focal lesion. Sinuses/Orbits: No acute finding. Other: None IMPRESSION: 1. Numerous bilateral mostly cortical and subcortical foci of hyperdensity, both calcified and non calcified with interim extensive bilateral white matter edema, suspect that the findings are due to progression of metastatic disease, less likely scattered foci of hemorrhage/blood product. There is approximate 4 mm midline shift to the right. There is generalized sulcal effacement consistent with edema. Critical Value/emergent results were called by telephone at the time of interpretation on 03/08/2022 at 7:37 pm to provider [facility] , who verbally acknowledged these results. Electronically  Signed   By: Donavan Foil M.D.   On: 03/08/2022 19:37   DG Chest Port 1 View  Result Date: 03/08/2022 CLINICAL DATA:  Altered mental status and weakness EXAM: PORTABLE CHEST 1 VIEW COMPARISON:  02/26/2022 FINDINGS: Cardiac shadow is within normal limits. Right chest wall port is again seen and stable. Lungs are well aerated bilaterally. No focal infiltrate or sizable effusion is seen. IMPRESSION: No acute abnormality noted. Electronically Signed   By: Inez Catalina M.D.   On: 03/08/2022 19:12   DG Chest 2 View  Result Date: 02/26/2022 CLINICAL DATA:  Weakness EXAM: CHEST - 2 VIEW COMPARISON:  Radiograph 05/25/2021, CT 09/12/2021 FINDINGS: Right neck catheter tip overlies the superior cavoatrial junction. Unchanged cardiomediastinal silhouette. Unchanged right middle lobe scarring. No new airspace disease. No pleural effusion. No pneumothorax. No acute osseous abnormality. IMPRESSION: No evidence of acute cardiopulmonary disease. Electronically Signed   By: Maurine Simmering M.D.   On: 02/26/2022 14:03      Subjective:   Discharge Exam: Vitals:   03/09/22 0800 03/09/22 1100  BP: 101/68 114/87  Pulse: 66 68  Resp: 10 11  Temp:    SpO2: 100% 100%   Vitals:   03/09/22 0630 03/09/22 0745 03/09/22 0800 03/09/22 1100  BP: 105/72  101/68 114/87  Pulse: 78 66 66 68  Resp: 13 11 10 11   Temp:      TempSrc:      SpO2: 95% 100% 100% 100%  Weight:      Height:  General: Pt is alert, awake, confused not in acute distress Cardiovascular: RRR, S1/S2 +, no rubs, no gallops Respiratory: CTA bilaterally, no wheezing, no rhonchi Abdominal: Soft, NT, ND, bowel sounds + Extremities: no edema, no cyanosis    The results of significant diagnostics from this hospitalization (including imaging, microbiology, ancillary and laboratory) are listed below for reference.     Microbiology: Recent Results (from the past 240 hour(s))  Resp Panel by RT-PCR (Flu A&B, Covid) Anterior Nasal Swab     Status:  None   Collection Time: 03/08/22  6:30 PM   Specimen: Anterior Nasal Swab  Result Value Ref Range Status   SARS Coronavirus 2 by RT PCR NEGATIVE NEGATIVE Final    Comment: (NOTE) SARS-CoV-2 target nucleic acids are NOT DETECTED.  The SARS-CoV-2 RNA is generally detectable in upper respiratory specimens during the acute phase of infection. The lowest concentration of SARS-CoV-2 viral copies this assay can detect is 138 copies/mL. A negative result does not preclude SARS-Cov-2 infection and should not be used as the sole basis for treatment or other patient management decisions. A negative result may occur with  improper specimen collection/handling, submission of specimen other than nasopharyngeal swab, presence of viral mutation(s) within the areas targeted by this assay, and inadequate number of viral copies(<138 copies/mL). A negative result must be combined with clinical observations, patient history, and epidemiological information. The expected result is Negative.  Fact Sheet for Patients:  EntrepreneurPulse.com.au  Fact Sheet for Healthcare Providers:  IncredibleEmployment.be  This test is no t yet approved or cleared by the [place] FDA and  has been authorized for detection and/or diagnosis of SARS-CoV-2 by FDA under an Emergency Use Authorization (EUA). This EUA will remain  in effect (meaning this test can be used) for the duration of the COVID-19 declaration under Section 564(b)(1) of the Act, 21 U.S.C.section 360bbb-3(b)(1), unless the authorization is terminated  or revoked sooner.       Influenza A by PCR NEGATIVE NEGATIVE Final   Influenza B by PCR NEGATIVE NEGATIVE Final    Comment: (NOTE) The Xpert Xpress SARS-CoV-2/FLU/RSV plus assay is intended as an aid in the diagnosis of influenza from Nasopharyngeal swab specimens and should not be used as a sole basis for treatment. Nasal washings and aspirates are unacceptable  for Xpert Xpress SARS-CoV-2/FLU/RSV testing.  Fact Sheet for Patients: EntrepreneurPulse.com.au  Fact Sheet for Healthcare Providers: IncredibleEmployment.be  This test is not yet approved or cleared by the [place] FDA and has been authorized for detection and/or diagnosis of SARS-CoV-2 by FDA under an Emergency Use Authorization (EUA). This EUA will remain in effect (meaning this test can be used) for the duration of the COVID-19 declaration under Section 564(b)(1) of the Act, 21 U.S.C. section 360bbb-3(b)(1), unless the authorization is terminated or revoked.  Performed at [facility], [address] [place], [place] [address]      Labs: BNP (last 3 results) No results for input(s): "BNP" in the last 8760 hours. Basic Metabolic Panel: Recent Labs  Lab 03/08/22 1826 03/09/22 0041 03/09/22 0841  NA 134*  --  136  K 3.5  --  3.8  CL 99  --  100  CO2 25  --  24  GLUCOSE 111*  --  113*  BUN 10  --  10  CREATININE 0.51  --  0.45  CALCIUM 9.5 9.7 9.8  MG 1.8  --   --    Liver Function Tests: Recent Labs  Lab 03/08/22 1826 03/09/22  0841  AST 22 23  ALT 18 18  ALKPHOS 83 85  BILITOT 0.7 0.5  PROT 8.0 8.3*  ALBUMIN 4.4 4.5   No results for input(s): "LIPASE", "AMYLASE" in the last 168 hours. Recent Labs  Lab 03/08/22 2216  AMMONIA 32   CBC: Recent Labs  Lab 03/08/22 1826 03/09/22 0841  WBC 7.0 5.5  NEUTROABS 6.0  --   HGB 11.8* 12.0  HCT 35.7* 35.7*  MCV 89.3 88.8  PLT 219 243   Cardiac Enzymes: No results for input(s): "CKTOTAL", "CKMB", "CKMBINDEX", "TROPONINI" in the last 168 hours. BNP: Invalid input(s): "POCBNP" CBG: No results for input(s): "GLUCAP" in the last 168 hours. D-Dimer No results for input(s): "DDIMER" in the last 72 hours. Hgb A1c No results for input(s): "HGBA1C" in the last 72 hours. Lipid Profile No results for input(s): "CHOL", "HDL", "LDLCALC", "TRIG", "CHOLHDL",  "LDLDIRECT" in the last 72 hours. Thyroid function studies Recent Labs    03/09/22 0041  TSH 32.480*   Anemia work up No results for input(s): "VITAMINB12", "FOLATE", "FERRITIN", "TIBC", "IRON", "RETICCTPCT" in the last 72 hours. Urinalysis    Component Value Date/Time   COLORURINE YELLOW (A) 03/08/2022 1820   APPEARANCEUR CLEAR (A) 03/08/2022 1820   APPEARANCEUR Hazy 12/16/2013 1647   LABSPEC 1.011 03/08/2022 1820   LABSPEC 1.015 12/16/2013 1647   PHURINE 5.0 03/08/2022 1820   GLUCOSEU NEGATIVE 03/08/2022 1820   GLUCOSEU Negative 12/16/2013 1647   HGBUR NEGATIVE 03/08/2022 1820   BILIRUBINUR NEGATIVE 03/08/2022 1820   BILIRUBINUR Negative 04/09/2018 1319   BILIRUBINUR Negative 12/16/2013 1647   KETONESUR NEGATIVE 03/08/2022 1820   PROTEINUR NEGATIVE 03/08/2022 1820   UROBILINOGEN 0.2 04/09/2018 1319   NITRITE NEGATIVE 03/08/2022 1820   LEUKOCYTESUR NEGATIVE 03/08/2022 1820   LEUKOCYTESUR Negative 12/16/2013 1647   Sepsis Labs Recent Labs  Lab 03/08/22 1826 03/09/22 0841  WBC 7.0 5.5   Microbiology Recent Results (from the past 240 hour(s))  Resp Panel by RT-PCR (Flu A&B, Covid) Anterior Nasal Swab     Status: None   Collection Time: 03/08/22  6:30 PM   Specimen: Anterior Nasal Swab  Result Value Ref Range Status   SARS Coronavirus 2 by RT PCR NEGATIVE NEGATIVE Final    Comment: (NOTE) SARS-CoV-2 target nucleic acids are NOT DETECTED.  The SARS-CoV-2 RNA is generally detectable in upper respiratory specimens during the acute phase of infection. The lowest concentration of SARS-CoV-2 viral copies this assay can detect is 138 copies/mL. A negative result does not preclude SARS-Cov-2 infection and should not be used as the sole basis for treatment or other patient management decisions. A negative result may occur with  improper specimen collection/handling, submission of specimen other than nasopharyngeal swab, presence of viral mutation(s) within the areas  targeted by this assay, and inadequate number of viral copies(<138 copies/mL). A negative result must be combined with clinical observations, patient history, and epidemiological information. The expected result is Negative.  Fact Sheet for Patients:  EntrepreneurPulse.com.au  Fact Sheet for Healthcare Providers:  IncredibleEmployment.be  This test is no t yet approved or cleared by the [place] FDA and  has been authorized for detection and/or diagnosis of SARS-CoV-2 by FDA under an Emergency Use Authorization (EUA). This EUA will remain  in effect (meaning this test can be used) for the duration of the COVID-19 declaration under Section 564(b)(1) of the Act, 21 U.S.C.section 360bbb-3(b)(1), unless the authorization is terminated  or revoked sooner.       Influenza A by PCR  NEGATIVE NEGATIVE Final   Influenza B by PCR NEGATIVE NEGATIVE Final    Comment: (NOTE) The Xpert Xpress SARS-CoV-2/FLU/RSV plus assay is intended as an aid in the diagnosis of influenza from Nasopharyngeal swab specimens and should not be used as a sole basis for treatment. Nasal washings and aspirates are unacceptable for Xpert Xpress SARS-CoV-2/FLU/RSV testing.  Fact Sheet for Patients: EntrepreneurPulse.com.au  Fact Sheet for Healthcare Providers: IncredibleEmployment.be  This test is not yet approved or cleared by the [place] FDA and has been authorized for detection and/or diagnosis of SARS-CoV-2 by FDA under an Emergency Use Authorization (EUA). This EUA will remain in effect (meaning this test can be used) for the duration of the COVID-19 declaration under Section 564(b)(1) of the Act, 21 U.S.C. section 360bbb-3(b)(1), unless the authorization is terminated or revoked.  Performed at [facility], [address] [place], [place] [address]      Time coordinating discharge: Over 30  minutes  SIGNED:   Nolberto Hanlon, MD  [facility] 03/09/2022, 1:02 PM Pager   If 7PM-7AM, please contact night-coverage [email or web address] Password [email or web address]

## 2022-03-09 NOTE — ED Notes (Signed)
Pt alert, discharge instructions given to EMS, pt left in a stretcher with EMS.

## 2022-03-09 NOTE — Progress Notes (Signed)
I spoke to Dr.Sahar regarding patient's recent admission for altered mental status.  MRI shows worsened intracranial disease.  I reviewed the patient's chart-patient previously received whole brain radiation.   Also, discuss with Dr. Tasia Catchings, patient's family oncologist- who agrees that hospice is the most reasonable choice.  I would recommend inpatient hospice given patients significantly worsened intracranial disease. < BR> GB

## 2022-03-14 ENCOUNTER — Encounter: Payer: Self-pay | Admitting: Oncology

## 2022-03-14 ENCOUNTER — Telehealth: Payer: Self-pay

## 2022-03-14 ENCOUNTER — Ambulatory Visit: Payer: Medicaid Other | Admitting: Radiation Oncology

## 2022-03-14 NOTE — Telephone Encounter (Signed)
Patient discharged from hospital with hospice. Confirmed with brother Eulas Post. Please cancel all future appts.

## 2022-03-15 ENCOUNTER — Inpatient Hospital Stay: Payer: Medicaid Other

## 2022-03-15 ENCOUNTER — Other Ambulatory Visit: Payer: Self-pay

## 2022-03-15 ENCOUNTER — Ambulatory Visit: Payer: Medicaid Other | Admitting: Nurse Practitioner

## 2022-03-15 ENCOUNTER — Inpatient Hospital Stay: Payer: Medicaid Other | Admitting: Nurse Practitioner

## 2022-03-15 ENCOUNTER — Ambulatory Visit: Payer: Medicaid Other

## 2022-03-17 ENCOUNTER — Other Ambulatory Visit: Payer: Self-pay | Admitting: Oncology

## 2022-03-18 ENCOUNTER — Encounter: Payer: Self-pay | Admitting: Oncology

## 2022-03-23 ENCOUNTER — Other Ambulatory Visit: Payer: Self-pay | Admitting: Oncology

## 2022-03-25 NOTE — Telephone Encounter (Signed)
Component Ref Range & Units 2 wk ago (03/09/22) 2 wk ago (03/09/22) 2 wk ago (03/08/22) 3 wk ago (02/26/22) 1 mo ago (02/22/22) 3 mo ago (12/07/21) 5 mo ago (10/09/21)  Potassium 3.5 - 5.1 mmol/L 3.8   3.5  3.5  3.6  3.8  3.7

## 2022-04-02 DEATH — deceased

## 2022-04-05 ENCOUNTER — Ambulatory Visit: Payer: Medicaid Other

## 2022-04-05 ENCOUNTER — Ambulatory Visit: Payer: Medicaid Other | Admitting: Oncology

## 2022-04-05 ENCOUNTER — Other Ambulatory Visit: Payer: Self-pay

## 2022-10-07 IMAGING — CT CT CHEST-ABD-PELV W/ CM
2 of 5 series · 8 of 36 positions shown, 14 images · IV contrast (agent unspecified)
Comparison: CT chest, 05/24/2021, CT chest abdomen pelvis,
05/08/2021

CLINICAL DATA: Metastatic small-cell lung cancer restaging, ongoing
chemotherapy and immunotherapy

EXAM:
CT CHEST, ABDOMEN, AND PELVIS WITH CONTRAST
TECHNIQUE: Multidetector CT imaging of the chest, abdomen and pelvis was
performed following the standard protocol during bolus
administration of intravenous contrast.

[Series 2: axials cap 5.00 · axial · 0.72mm/px · z∈[-1429,-989]mm · 5 of 133 slices shown, 10 images]
[im 23/133  mediastinal]
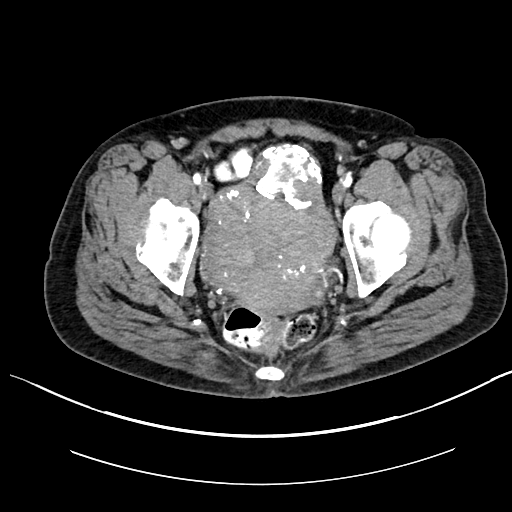
[im 23/133  bone]
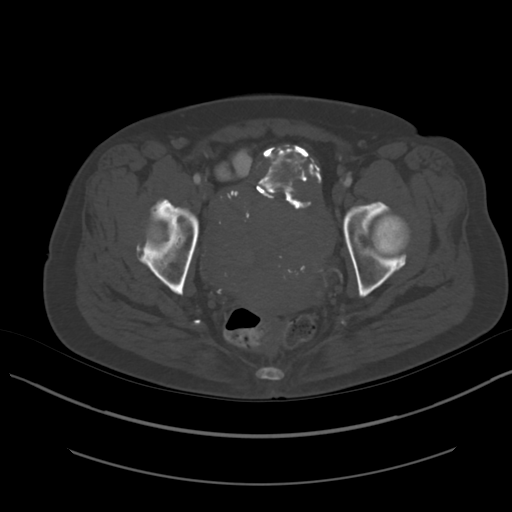
[im 45/133  mediastinal]
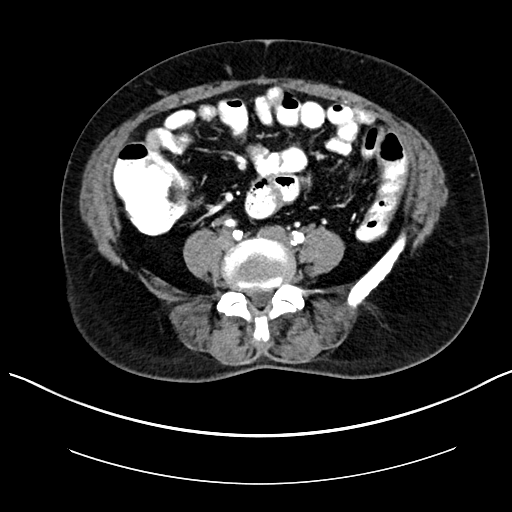
[im 45/133  lung]
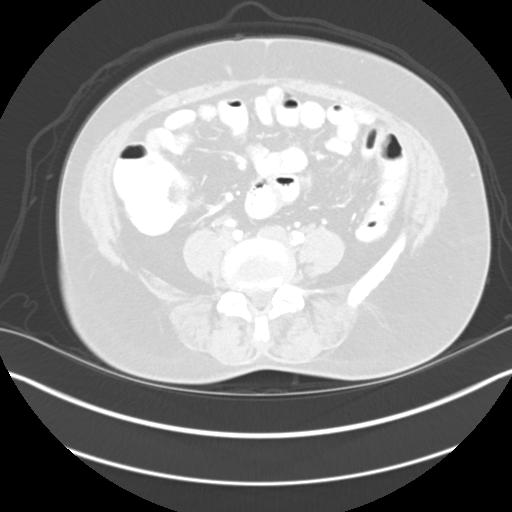
[im 67/133  mediastinal]
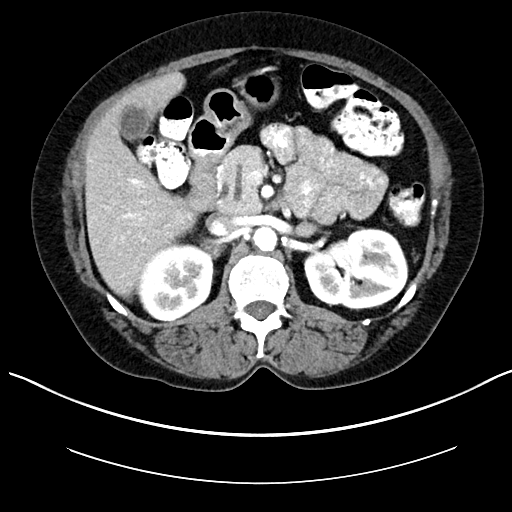
[im 67/133  lung]
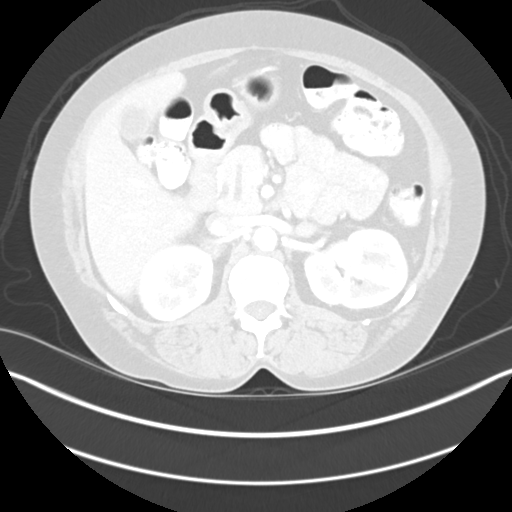
[im 89/133  mediastinal]
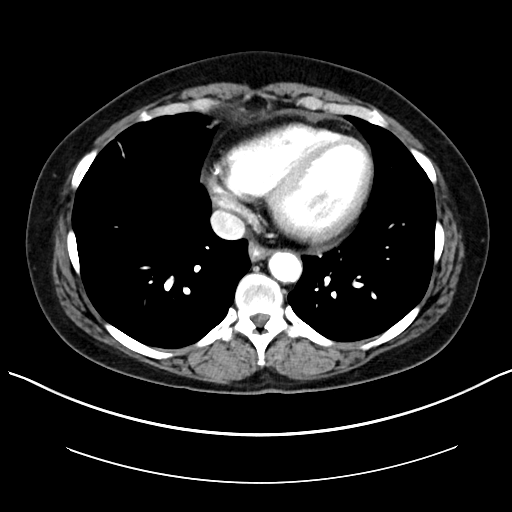
[im 89/133  lung]
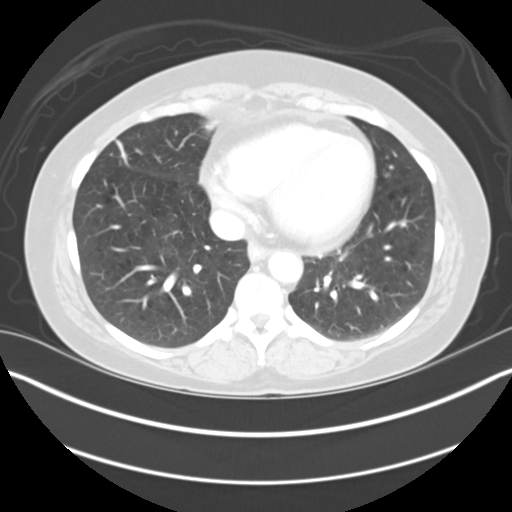
[im 111/133  mediastinal]
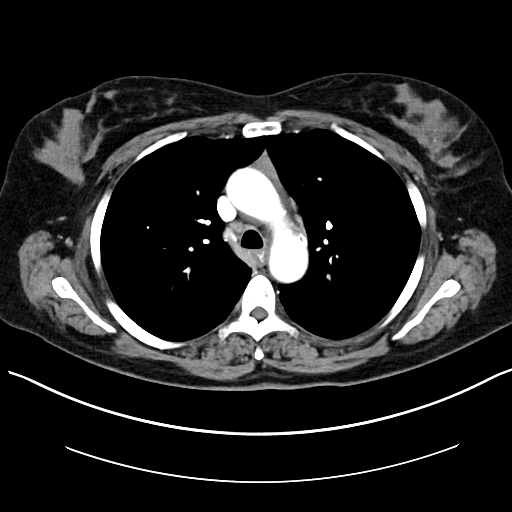
[im 111/133  lung]
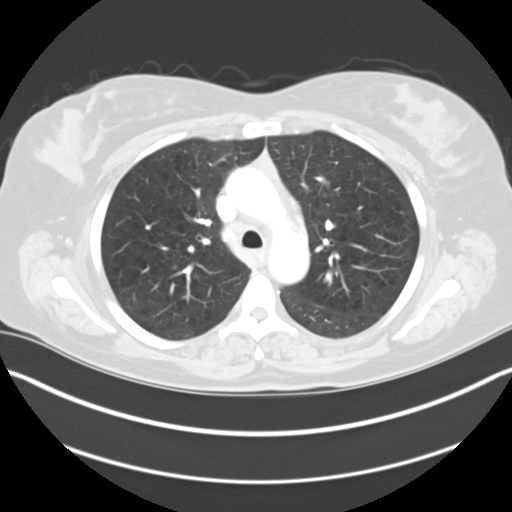

[Series 4: coronals cap 2.00 cor · coronal · 0.72mm/px · 3 of 149 slices shown, 4 images]
[im 30/149  mediastinal]
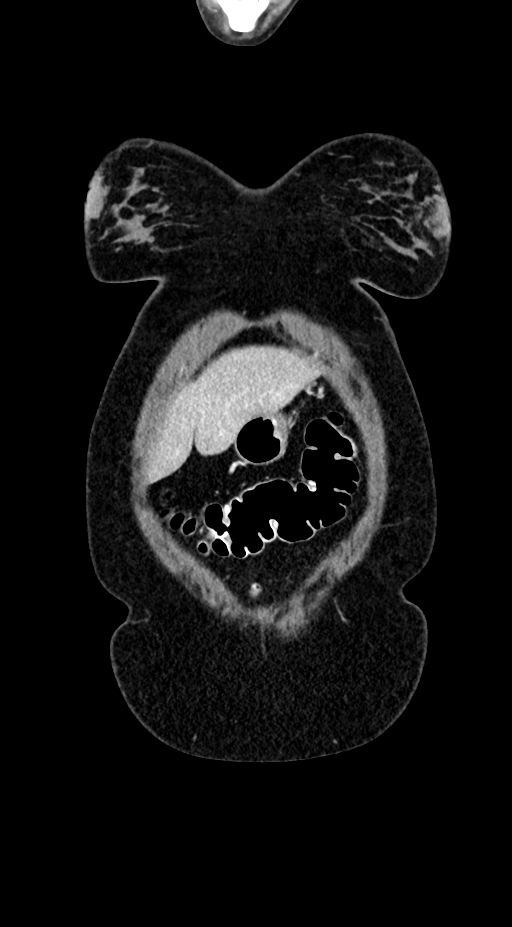
[im 60/149  mediastinal]
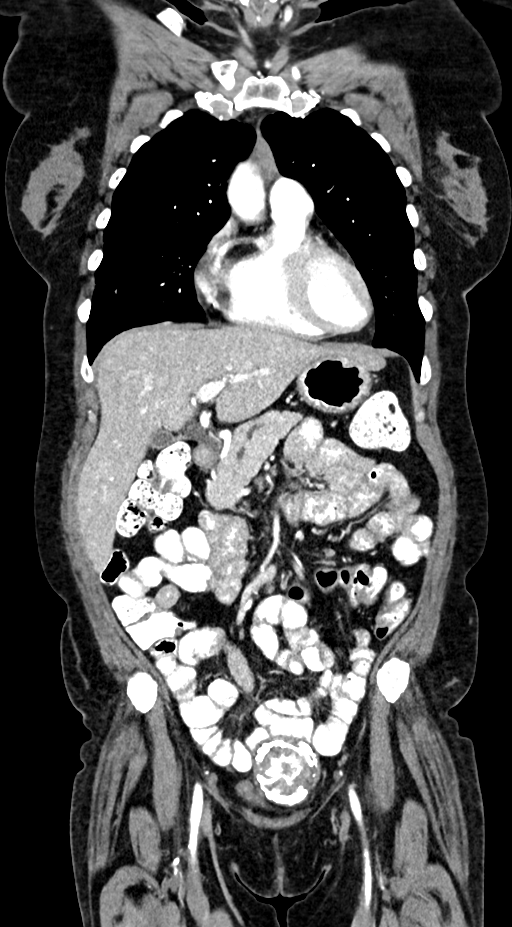
[im 60/149  bone]
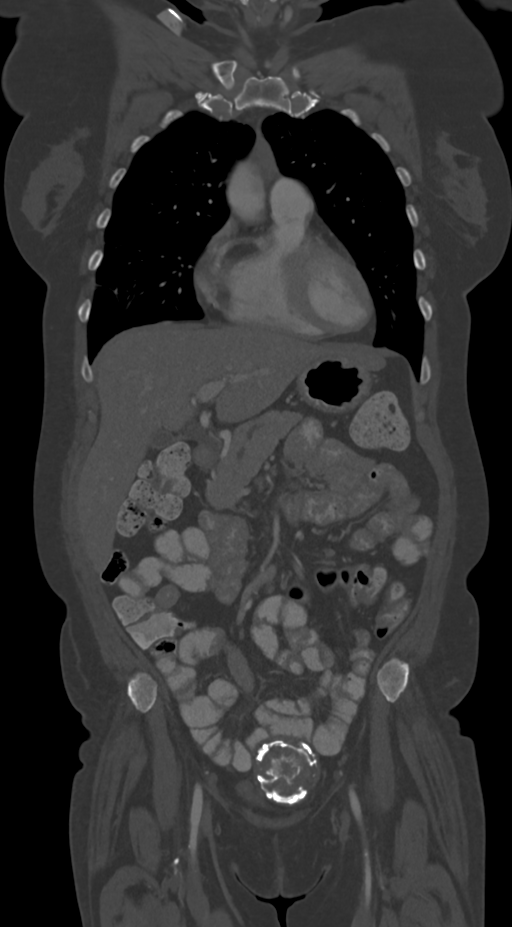
[im 89/149  mediastinal]
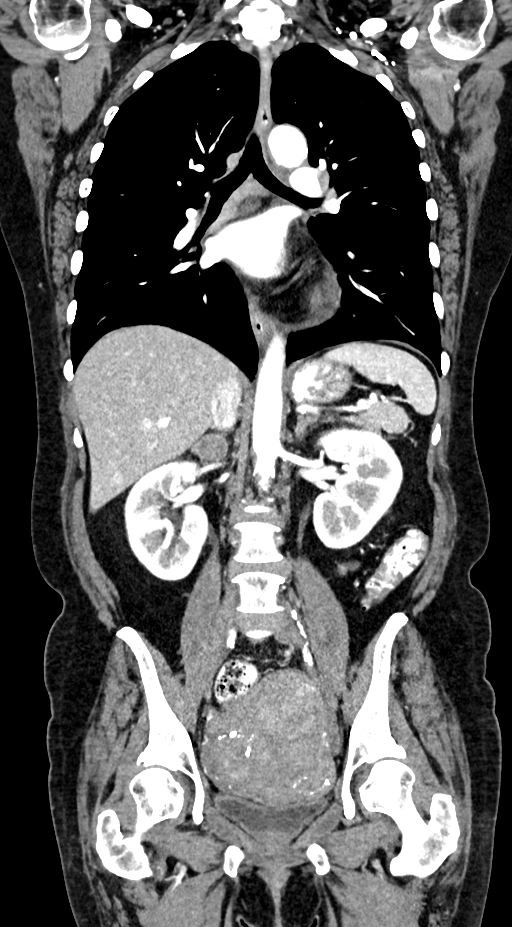

[8 of 36 positions shown; findings below may reference images not displayed]

RADIATION DOSE REDUCTION: This exam was performed according to the
departmental dose-optimization program which includes automated
exposure control, adjustment of the mA and/or kV according to
patient size and/or use of iterative reconstruction technique.

CONTRAST:  100mL OMNIPAQUE IOHEXOL 300 MG/ML SOLN, additional oral
enteric contrast
FINDINGS: CT CHEST FINDINGS

Cardiovascular: Right chest port catheter. Aortic atherosclerosis.
Normal heart size. Three-vessel coronary artery calcifications. No
pericardial effusion.

Mediastinum/Nodes: Unchanged prominent pretracheal and left hilar
lymph nodes, pretracheal node measuring up to 1.5 x 1.3 cm (series
2, image 25). Thymic remnant in the anterior mediastinum. Thyroid
gland, trachea, and esophagus demonstrate no significant findings.

Lungs/Pleura: Moderate centrilobular emphysema. Interval decrease in
size of an elongated, somewhat tubular nodule in the posterior left
upper lobe, now measuring 2.7 x 0.9 cm in the sagittal plane,
previously 3.2 x 1.2 cm (series 6, image 119). Unchanged, bandlike
scarring of the lateral segment right middle lobe (series 3, image
96). No pleural effusion or pneumothorax.

Musculoskeletal: No chest wall mass or suspicious osseous lesions
identified.

CT ABDOMEN PELVIS FINDINGS

Hepatobiliary: No solid liver abnormality is seen. No gallstones,
gallbladder wall thickening, or biliary dilatation.

Pancreas: Unremarkable. No pancreatic ductal dilatation or
surrounding inflammatory changes.

Spleen: Normal in size without significant abnormality.

Adrenals/Urinary Tract: Interval enlargement of lateral limb left
adrenal nodule, measuring 1.9 x 1.6 cm, previously 1.4 x 0.7 cm
(series 2, image 65). Unchanged nodule of the right adrenal body
measuring 2.3 x 2.0 cm (series 2, image 65). Kidneys are normal,
without renal calculi, solid lesion, or hydronephrosis. Bladder is
unremarkable.

Stomach/Bowel: Stomach is within normal limits. Appendix appears
normal. No evidence of bowel wall thickening, distention, or
inflammatory changes. Occasional sigmoid diverticula.

Vascular/Lymphatic: Aortic atherosclerosis. No enlarged abdominal or
pelvic lymph nodes.

Reproductive: Bulky, calcified uterine fibroids.

Other: No abdominal wall hernia or abnormality. No ascites.

Musculoskeletal: No acute osseous findings.
IMPRESSION: 1. Interval decrease in size of an elongated, somewhat tubular
nodule in the posterior left upper lobe, consistent with treatment
response.
2. Unchanged prominent pretracheal and left hilar lymph nodes, which
remain worrisome for nodal metastases. Continued attention on
follow-up.
3. Interval enlargement of lateral limb left adrenal nodule,
consistent with a new or enlarged adrenal metastasis. Unchanged
nodule of the right adrenal body, stable long-term and consistent
with a benign adenoma.
4. No evidence of new metastatic disease in the chest, abdomen, or
pelvis.
5. Emphysema.
6. Coronary artery disease.
7. Uterine fibroids.

Aortic Atherosclerosis (ZONCP-4YH.H) and Emphysema (ZONCP-9ER.U).

## 2022-11-11 IMAGING — CT NM PET TUM IMG RESTAG (PS) SKULL BASE T - THIGH
9 series · 24 of 25 positions shown · non-contrast
Comparison: Chest abdomen and pelvis CT of September 12, 2021 and
prior imaging dating back to Monday May, 2021.

CLINICAL DATA: Initial treatment strategy for small cell lung
cancer.

EXAM:
NUCLEAR MEDICINE PET SKULL BASE TO THIGH
TECHNIQUE: 8.33 mCi F-18 FDG was injected intravenously. Full-ring PET imaging
was performed from the skull base to thigh after the radiotracer. CT
data was obtained and used for attenuation correction and anatomic
localization.
Fasting blood glucose: 86 mg/dl

[Series 3: ct wb 5.0 b30f · axial · 5.0mm · 0.98mm/px · z∈[-1530,-546]mm · 3 of 329 slices shown]
[im 1/329]
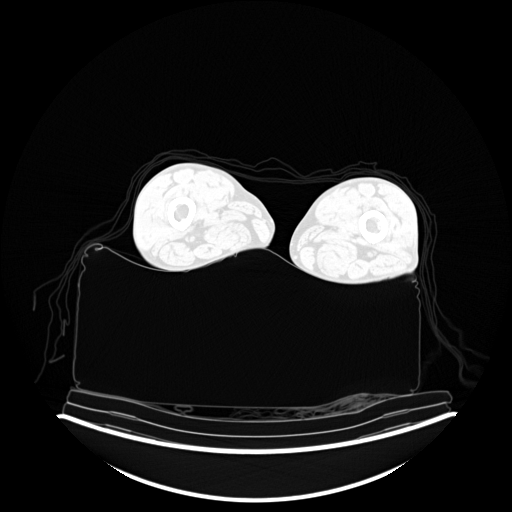
[im 165/329]
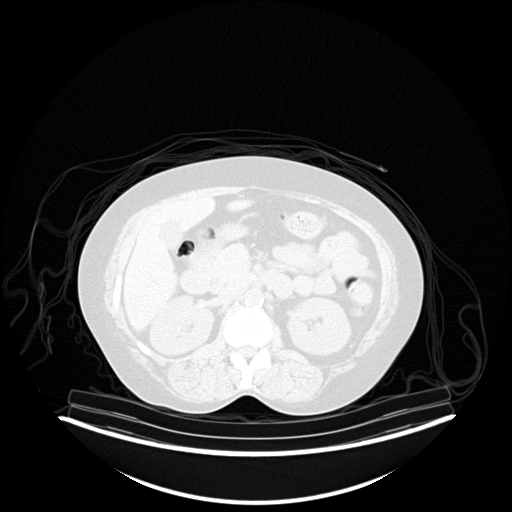
[im 329/329  brain]
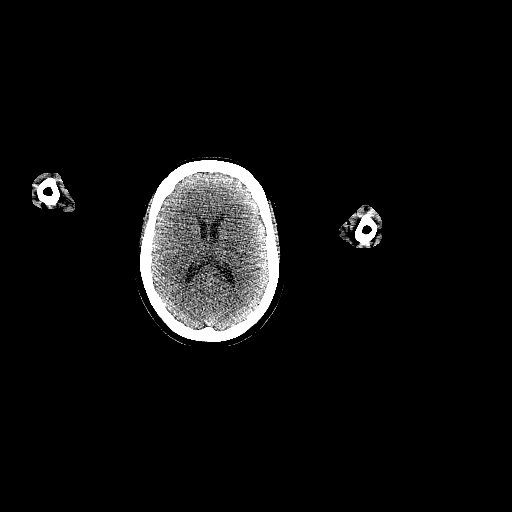

[Series 5: pet wb uncorrected (nac) · axial · 5.0mm · 4.07mm/px · z∈[-1530,-546]mm · 4 of 329 slices shown]
[im 1/329]
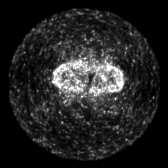
[im 110/329]
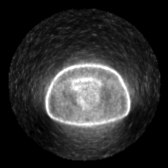
[im 219/329]
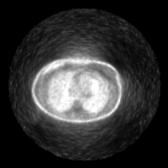
[im 329/329]
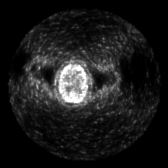

[Series 6: pet wb (ac) · axial · 5.0mm · 2.91mm/px · z∈[-1530,-546]mm · 4 of 329 slices shown]
[im 1/329]
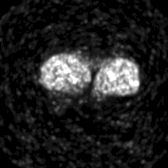
[im 110/329]
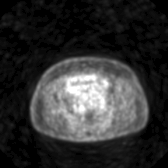
[im 219/329]
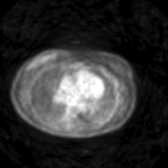
[im 329/329]
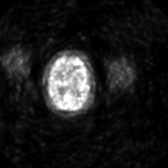

[Series 603: pet axial fused · 3 of 327 slices shown]
[im 1/327]
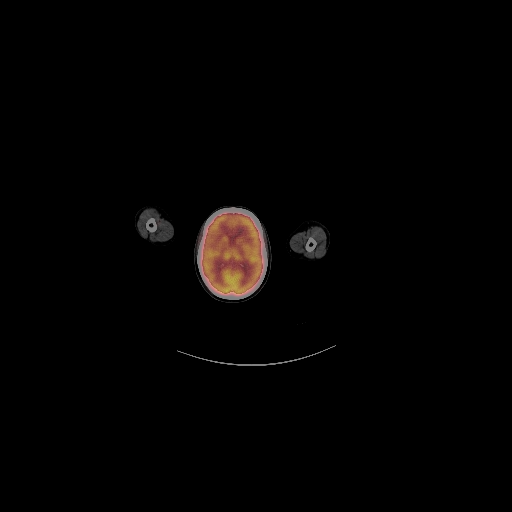
[im 218/327]
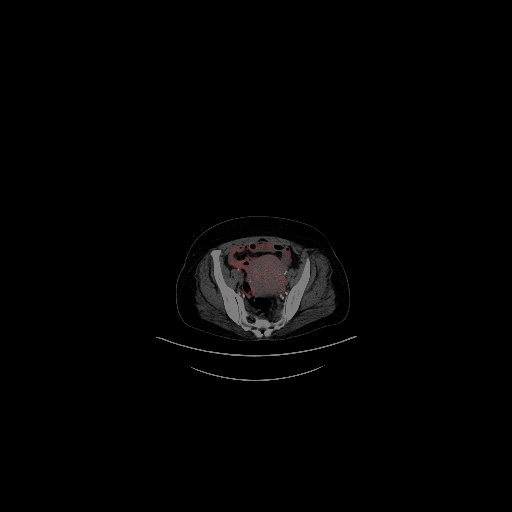
[im 327/327]
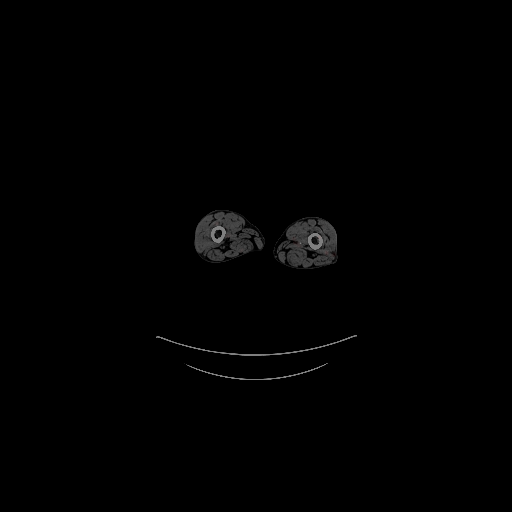

[Series 604: pet coronal fused · 1 of 81 slices shown]
[im 1/81]
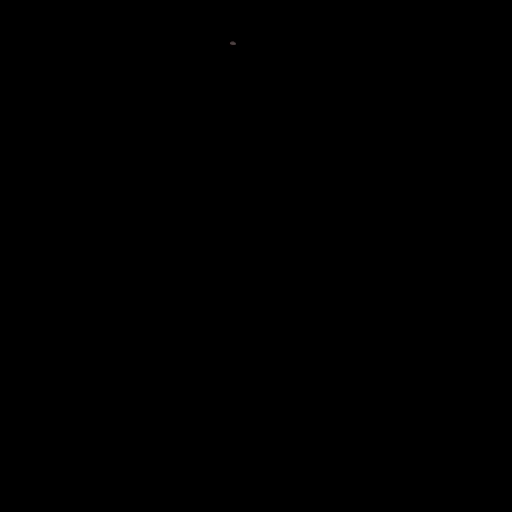

[Series 605: pet sagittal fused · 2 of 146 slices shown]
[im 1/146]
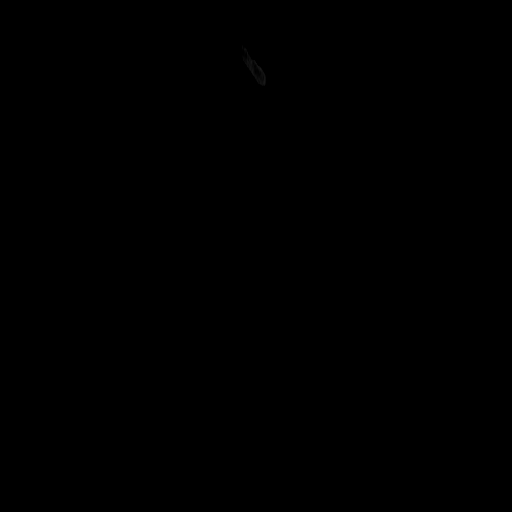
[im 146/146]
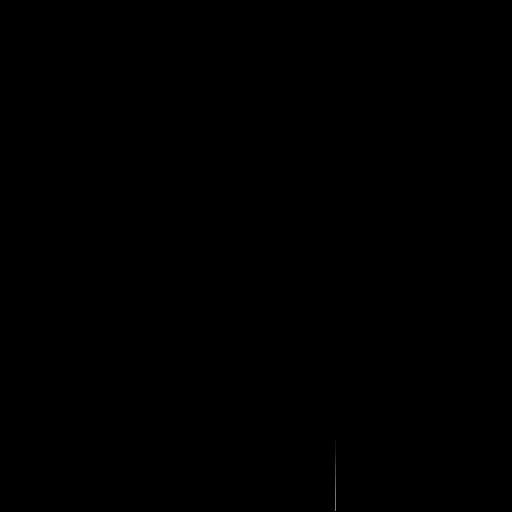

[Series 606: pet axial · 4 of 327 slices shown]
[im 1/327]
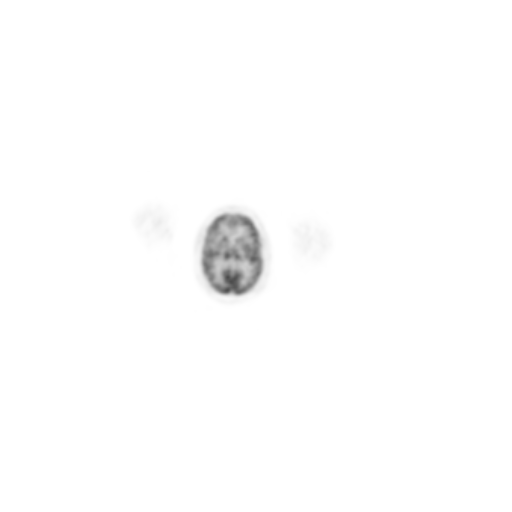
[im 109/327]
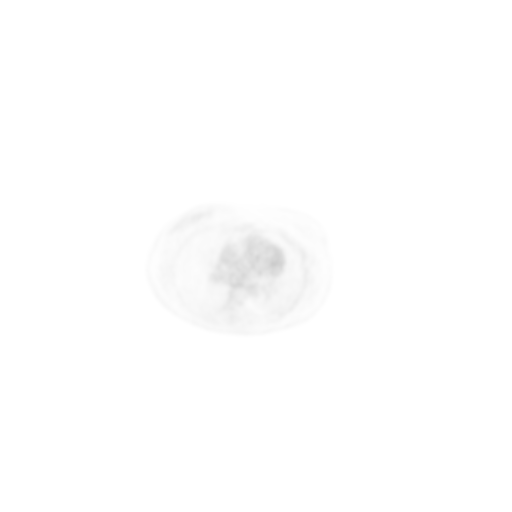
[im 218/327]
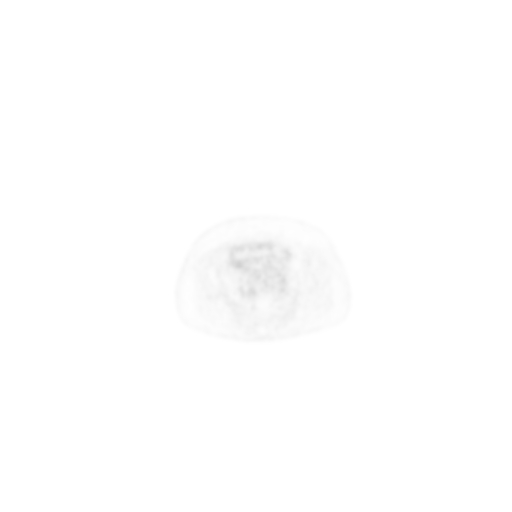
[im 327/327]
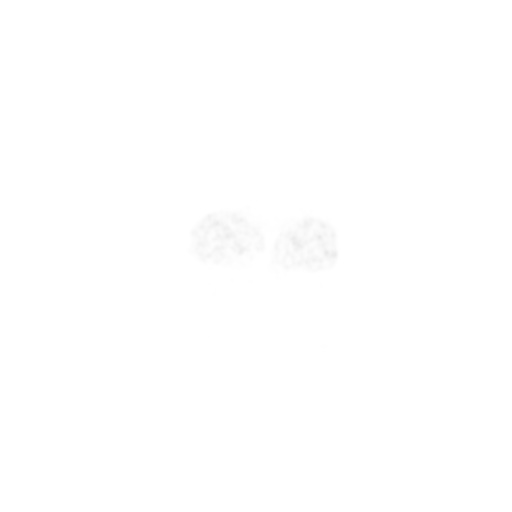

[Series 608: pet coronal 2 · 1 of 96 slices shown]
[im 1/96]
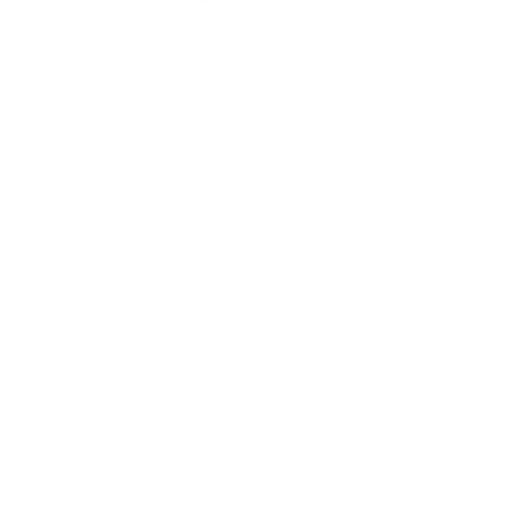

[Series 609: pet sagittal · 2 of 147 slices shown]
[im 1/147]
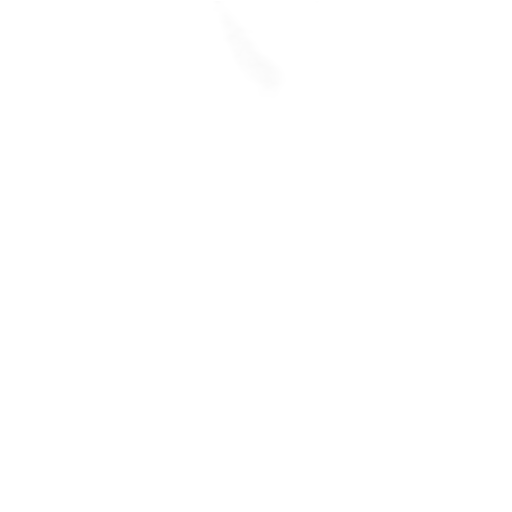
[im 147/147]
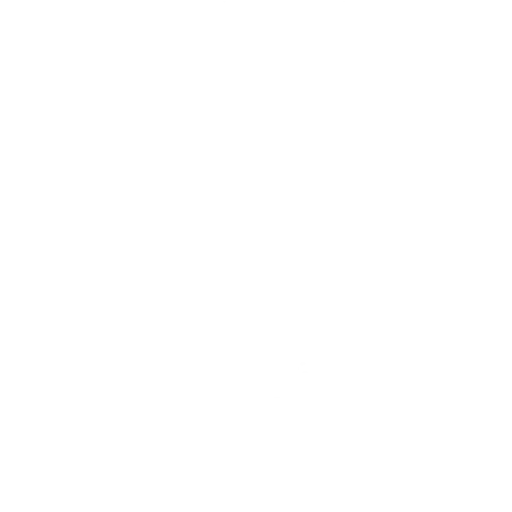

[24 of 25 positions shown; findings below may reference images not displayed]

FINDINGS: Mediastinal blood pool activity: SUV max

Liver activity: SUV max NA

NECK: Small nodule in the LEFT posterior parotid (image [DATE]) 7 mm
with a maximum SUV of

Incidental CT findings: none

CHEST: Hypermetabolic nodule in the LEFT upper lobe without
substantial change on CT compared to recent imaging measuring
approximately 9 mm greatest axial dimension diminished in size from
prior studies with a maximum SUV of 3.0.

Mild fullness of LEFT hilar nodal tissue with a maximum SUV of
(image 94/3).

RIGHT paratracheal lymph node with mild enlargement measuring 11 mm
similar to recent imaging with a maximum SUV of 5.1.

AP window/LEFT paratracheal nodal tissue maximum SUV 5.0 measuring
less than a cm. Background pulmonary emphysema without signs of
additional pulmonary nodule.

Incidental CT findings: Aortic atherosclerosis. Heart size normal
without pericardial effusion of substantial volume with three-vessel
coronary artery disease. Normal caliber of the thoracic aorta.
RIGHT-sided Port-A-Cath terminates in the upper RIGHT atrium. No
effusion or consolidation in the chest.

ABDOMEN/PELVIS: LEFT adrenal nodule measuring 16 mm, increased
compared to previous imaging from [id number] and stable compared to recent
comparison imaging from [facility] shows a maximum SUV of 5.7.

RIGHT adrenal adenoma is favored with density values of the RIGHT
adrenal lesion that are indeterminate but with stable size and
without increased metabolic activity above mediastinal blood pool or
liver. This measures approximately 19 mm.

Central mesenteric lymph node measuring approximately 14 mm
increased from Monday May, 2021 where it measured 11 mm, now
showing a maximum SUV of 7.9.

Incidental CT findings: No acute process related to liver,
gallbladder, pancreas, spleen, adrenal glands or kidneys with
adrenal findings as outlined above. Renal vascular calcifications.

No hydronephrosis. No perinephric stranding. Urinary bladder is
largely collapsed. No acute gastrointestinal findings. Normal
appendix. Scattered colonic diverticulosis. Aortic atherosclerosis
without aneurysm. Multiple calcified uterine leiomyomata with
enlarged uterus as before.

SKELETON: No focal hypermetabolic activity to suggest skeletal
metastasis.

Incidental CT findings: none
IMPRESSION: Signs of known small cell lung cancer with diminished size of LEFT
upper lobe nodule showing persistent disease in the mediastinum and
in the LEFT adrenal.

Evidence of central abdominal lymph node showing slow increase in
size over time with increased metabolic activity as well, though an
atypical site for lung cancer metastasis, in the setting of small
cell lung cancer this is favored at this time.

LEFT parotid lesion that may be either benign or malignant with very
low level metabolic activity, consider focused ultrasound for
further assessment.

Aortic Atherosclerosis (9MLPD-6S0.0) and Emphysema (9MLPD-FES.J).
